# Patient Record
Sex: Female | Born: 1950 | Race: White | Hispanic: No | Marital: Married | Smoking: Former smoker
Health system: Southern US, Community
[De-identification: ages and names within clinical notes are randomized; demographics above are authoritative.]

## PROBLEM LIST (undated history)

## (undated) DIAGNOSIS — K219 Gastro-esophageal reflux disease without esophagitis: Secondary | ICD-10-CM

## (undated) DIAGNOSIS — M1990 Unspecified osteoarthritis, unspecified site: Secondary | ICD-10-CM

## (undated) DIAGNOSIS — I341 Nonrheumatic mitral (valve) prolapse: Secondary | ICD-10-CM

## (undated) DIAGNOSIS — E785 Hyperlipidemia, unspecified: Secondary | ICD-10-CM

## (undated) DIAGNOSIS — M4800 Spinal stenosis, site unspecified: Secondary | ICD-10-CM

## (undated) DIAGNOSIS — J439 Emphysema, unspecified: Secondary | ICD-10-CM

## (undated) DIAGNOSIS — I10 Essential (primary) hypertension: Secondary | ICD-10-CM

## (undated) DIAGNOSIS — M810 Age-related osteoporosis without current pathological fracture: Secondary | ICD-10-CM

## (undated) DIAGNOSIS — K635 Polyp of colon: Secondary | ICD-10-CM

## (undated) DIAGNOSIS — K5790 Diverticulosis of intestine, part unspecified, without perforation or abscess without bleeding: Secondary | ICD-10-CM

## (undated) DIAGNOSIS — K222 Esophageal obstruction: Secondary | ICD-10-CM

## (undated) DIAGNOSIS — H269 Unspecified cataract: Secondary | ICD-10-CM

## (undated) DIAGNOSIS — Z8709 Personal history of other diseases of the respiratory system: Secondary | ICD-10-CM

## (undated) HISTORY — DX: Esophageal obstruction: K22.2

## (undated) HISTORY — DX: Essential (primary) hypertension: I10

## (undated) HISTORY — DX: Hyperlipidemia, unspecified: E78.5

## (undated) HISTORY — DX: Emphysema, unspecified: J43.9

## (undated) HISTORY — DX: Unspecified cataract: H26.9

## (undated) HISTORY — DX: Gastro-esophageal reflux disease without esophagitis: K21.9

## (undated) HISTORY — DX: Spinal stenosis, site unspecified: M48.00

## (undated) HISTORY — PX: COLONOSCOPY: SHX174

## (undated) HISTORY — DX: Diverticulosis of intestine, part unspecified, without perforation or abscess without bleeding: K57.90

## (undated) HISTORY — DX: Unspecified osteoarthritis, unspecified site: M19.90

## (undated) HISTORY — PX: TONSILLECTOMY: SUR1361

## (undated) HISTORY — DX: Polyp of colon: K63.5

---

## 1999-06-02 ENCOUNTER — Encounter: Payer: Self-pay | Admitting: Obstetrics and Gynecology

## 1999-06-02 ENCOUNTER — Encounter: Admission: RE | Admit: 1999-06-02 | Discharge: 1999-06-02 | Payer: Self-pay | Admitting: Obstetrics and Gynecology

## 2000-01-30 ENCOUNTER — Ambulatory Visit (HOSPITAL_COMMUNITY): Admission: RE | Admit: 2000-01-30 | Discharge: 2000-01-30 | Payer: Self-pay | Admitting: General Surgery

## 2000-01-30 ENCOUNTER — Encounter: Payer: Self-pay | Admitting: General Surgery

## 2000-06-25 ENCOUNTER — Encounter (INDEPENDENT_AMBULATORY_CARE_PROVIDER_SITE_OTHER): Payer: Self-pay

## 2000-06-25 ENCOUNTER — Ambulatory Visit (HOSPITAL_COMMUNITY): Admission: RE | Admit: 2000-06-25 | Discharge: 2000-06-25 | Payer: Self-pay | Admitting: Obstetrics and Gynecology

## 2000-06-25 ENCOUNTER — Encounter: Payer: Self-pay | Admitting: Obstetrics and Gynecology

## 2001-05-06 ENCOUNTER — Encounter: Payer: Self-pay | Admitting: Obstetrics and Gynecology

## 2001-05-06 ENCOUNTER — Encounter: Admission: RE | Admit: 2001-05-06 | Discharge: 2001-05-06 | Payer: Self-pay | Admitting: Obstetrics and Gynecology

## 2003-05-09 LAB — HM COLONOSCOPY: HM Colonoscopy: NORMAL

## 2004-08-17 ENCOUNTER — Encounter: Admission: RE | Admit: 2004-08-17 | Discharge: 2004-08-17 | Payer: Self-pay | Admitting: Orthopedic Surgery

## 2004-09-02 ENCOUNTER — Encounter: Admission: RE | Admit: 2004-09-02 | Discharge: 2004-09-02 | Payer: Self-pay | Admitting: Orthopedic Surgery

## 2004-10-24 ENCOUNTER — Ambulatory Visit: Payer: Self-pay | Admitting: Internal Medicine

## 2004-11-23 ENCOUNTER — Ambulatory Visit: Payer: Self-pay | Admitting: Internal Medicine

## 2005-10-30 ENCOUNTER — Ambulatory Visit: Payer: Self-pay | Admitting: Internal Medicine

## 2005-12-15 ENCOUNTER — Encounter: Admission: RE | Admit: 2005-12-15 | Discharge: 2005-12-15 | Payer: Self-pay | Admitting: Orthopedic Surgery

## 2005-12-22 ENCOUNTER — Ambulatory Visit: Payer: Self-pay | Admitting: Internal Medicine

## 2006-01-05 ENCOUNTER — Encounter: Admission: RE | Admit: 2006-01-05 | Discharge: 2006-01-05 | Payer: Self-pay | Admitting: Orthopedic Surgery

## 2006-01-12 ENCOUNTER — Ambulatory Visit: Payer: Self-pay | Admitting: Internal Medicine

## 2006-04-06 ENCOUNTER — Ambulatory Visit: Payer: Self-pay | Admitting: Internal Medicine

## 2006-04-06 LAB — CONVERTED CEMR LAB
Cholesterol: 200 mg/dL (ref 0–200)
Triglyceride fasting, serum: 145 mg/dL (ref 0–149)

## 2006-04-13 ENCOUNTER — Ambulatory Visit: Payer: Self-pay | Admitting: Internal Medicine

## 2006-07-17 ENCOUNTER — Ambulatory Visit: Payer: Self-pay | Admitting: Internal Medicine

## 2006-08-07 ENCOUNTER — Encounter: Payer: Self-pay | Admitting: Internal Medicine

## 2006-08-07 ENCOUNTER — Ambulatory Visit: Payer: Self-pay

## 2006-08-07 HISTORY — PX: NM MYOVIEW LTD: HXRAD82

## 2006-08-08 ENCOUNTER — Ambulatory Visit: Payer: Self-pay | Admitting: Internal Medicine

## 2006-09-14 ENCOUNTER — Ambulatory Visit: Payer: Self-pay | Admitting: Internal Medicine

## 2006-09-14 LAB — CONVERTED CEMR LAB
Cholesterol: 216 mg/dL (ref 0–200)
Direct LDL: 116.5 mg/dL
GFR calc non Af Amer: 69 mL/min
HDL: 45.7 mg/dL (ref >39.0)
Potassium: 4.5 meq/L (ref 3.5–5.1)
Sodium: 143 meq/L (ref 135–145)
Total CHOL/HDL Ratio: 4.7
VLDL: 44 mg/dL — ABNORMAL HIGH (ref 0–40)

## 2006-09-21 ENCOUNTER — Ambulatory Visit: Payer: Self-pay | Admitting: Internal Medicine

## 2007-01-18 ENCOUNTER — Ambulatory Visit: Payer: Self-pay | Admitting: Internal Medicine

## 2007-01-18 DIAGNOSIS — E785 Hyperlipidemia, unspecified: Secondary | ICD-10-CM | POA: Insufficient documentation

## 2007-01-18 DIAGNOSIS — E7800 Pure hypercholesterolemia, unspecified: Secondary | ICD-10-CM | POA: Insufficient documentation

## 2007-01-28 ENCOUNTER — Ambulatory Visit: Payer: Self-pay | Admitting: Internal Medicine

## 2007-01-28 DIAGNOSIS — I10 Essential (primary) hypertension: Secondary | ICD-10-CM | POA: Insufficient documentation

## 2007-01-28 DIAGNOSIS — M4800 Spinal stenosis, site unspecified: Secondary | ICD-10-CM | POA: Insufficient documentation

## 2007-01-29 ENCOUNTER — Encounter: Payer: Self-pay | Admitting: Internal Medicine

## 2007-02-22 ENCOUNTER — Encounter: Payer: Self-pay | Admitting: Internal Medicine

## 2007-03-08 ENCOUNTER — Ambulatory Visit: Payer: Self-pay | Admitting: Internal Medicine

## 2007-03-10 LAB — CONVERTED CEMR LAB
Bilirubin, Direct: 0.1 mg/dL (ref 0.0–0.3)
Cholesterol: 165 mg/dL (ref 0–200)
HDL: 53 mg/dL (ref >39.0)
Homocysteine: 6.8 micromoles/L (ref 5.00–13.90)
LDL Cholesterol: 97 mg/dL (ref 0–99)
Total CHOL/HDL Ratio: 3.1
Total Protein: 6.5 g/dL (ref 6.0–8.3)
Triglycerides: 73 mg/dL (ref 0–149)

## 2007-03-15 ENCOUNTER — Ambulatory Visit: Payer: Self-pay | Admitting: Internal Medicine

## 2007-03-15 DIAGNOSIS — M1990 Unspecified osteoarthritis, unspecified site: Secondary | ICD-10-CM | POA: Insufficient documentation

## 2007-03-15 LAB — CONVERTED CEMR LAB: Cholesterol, target level: 200 mg/dL

## 2007-07-02 ENCOUNTER — Telehealth: Payer: Self-pay | Admitting: Internal Medicine

## 2007-09-13 ENCOUNTER — Ambulatory Visit: Payer: Self-pay | Admitting: Internal Medicine

## 2007-09-13 DIAGNOSIS — M6740 Ganglion, unspecified site: Secondary | ICD-10-CM | POA: Insufficient documentation

## 2007-09-13 DIAGNOSIS — M949 Disorder of cartilage, unspecified: Secondary | ICD-10-CM

## 2007-09-13 DIAGNOSIS — M899 Disorder of bone, unspecified: Secondary | ICD-10-CM | POA: Insufficient documentation

## 2007-09-13 LAB — CONVERTED CEMR LAB: Vit D, 1,25-Dihydroxy: 55 (ref 30–89)

## 2007-09-19 LAB — CONVERTED CEMR LAB
ALT: 23 units/L (ref 0–35)
AST: 25 units/L (ref 0–37)
Alkaline Phosphatase: 73 units/L (ref 39–117)
Bilirubin, Direct: 0.1 mg/dL (ref 0.0–0.3)
CO2: 31 meq/L (ref 19–32)
Calcium: 9.2 mg/dL (ref 8.4–10.5)
Chloride: 107 meq/L (ref 96–112)
Glucose, Bld: 98 mg/dL (ref 70–99)
Hemoglobin: 14.4 g/dL (ref 12.0–15.0)
LDL Cholesterol: 83 mg/dL (ref 0–99)
Lymphocytes Relative: 30.6 % (ref 12.0–46.0)
Monocytes Relative: 8.9 % (ref 3.0–12.0)
Neutro Abs: 3.1 10*3/uL (ref 1.4–7.7)
RBC: 4.82 M/uL (ref 3.87–5.11)
RDW: 13.2 % (ref 11.5–14.6)
Sodium: 144 meq/L (ref 135–145)
Total CHOL/HDL Ratio: 2.9
Total Protein: 6.8 g/dL (ref 6.0–8.3)
Triglycerides: 111 mg/dL (ref 0–149)

## 2008-03-26 ENCOUNTER — Ambulatory Visit: Payer: Self-pay | Admitting: Internal Medicine

## 2008-03-26 DIAGNOSIS — IMO0001 Reserved for inherently not codable concepts without codable children: Secondary | ICD-10-CM | POA: Insufficient documentation

## 2008-03-26 DIAGNOSIS — T50995A Adverse effect of other drugs, medicaments and biological substances, initial encounter: Secondary | ICD-10-CM | POA: Insufficient documentation

## 2008-03-30 ENCOUNTER — Encounter: Payer: Self-pay | Admitting: Internal Medicine

## 2008-03-30 LAB — CONVERTED CEMR LAB
ALT: 23 units/L (ref 0–35)
AST: 26 units/L (ref 0–37)
BUN: 21 mg/dL (ref 6–23)
CO2: 28 meq/L (ref 19–32)
Calcium: 9.5 mg/dL (ref 8.4–10.5)
Creatinine, Ser: 0.9 mg/dL (ref 0.4–1.2)
Glucose, Bld: 91 mg/dL (ref 70–99)
Total CK: 91 units/L (ref 7–177)
Total Protein: 6.8 g/dL (ref 6.0–8.3)

## 2008-04-06 ENCOUNTER — Encounter: Payer: Self-pay | Admitting: Internal Medicine

## 2008-05-25 ENCOUNTER — Ambulatory Visit: Payer: Self-pay | Admitting: Internal Medicine

## 2008-06-01 ENCOUNTER — Encounter: Payer: Self-pay | Admitting: Internal Medicine

## 2008-06-01 LAB — CONVERTED CEMR LAB
BUN: 22 mg/dL (ref 6–23)
CO2: 31 meq/L (ref 19–32)
Chloride: 105 meq/L (ref 96–112)
Cholesterol: 218 mg/dL (ref 0–200)
Creatinine, Ser: 0.8 mg/dL (ref 0.4–1.2)
Direct LDL: 135 mg/dL
Potassium: 4.3 meq/L (ref 3.5–5.1)

## 2008-10-19 ENCOUNTER — Ambulatory Visit: Payer: Self-pay | Admitting: Internal Medicine

## 2008-10-19 LAB — CONVERTED CEMR LAB
Cholesterol: 231 mg/dL — ABNORMAL HIGH (ref 0–200)
HDL: 56.5 mg/dL (ref >39.00)
TSH: 3.74 microintl units/mL (ref 0.35–5.50)
Total CHOL/HDL Ratio: 4
Triglycerides: 113 mg/dL (ref 0.0–149.0)

## 2008-10-29 ENCOUNTER — Ambulatory Visit: Payer: Self-pay | Admitting: Internal Medicine

## 2009-03-02 ENCOUNTER — Ambulatory Visit: Payer: Self-pay | Admitting: Internal Medicine

## 2009-03-02 DIAGNOSIS — R3915 Urgency of urination: Secondary | ICD-10-CM | POA: Insufficient documentation

## 2009-03-02 DIAGNOSIS — M549 Dorsalgia, unspecified: Secondary | ICD-10-CM | POA: Insufficient documentation

## 2009-03-02 LAB — CONVERTED CEMR LAB
Bilirubin Urine: NEGATIVE
Ketones, urine, test strip: NEGATIVE
Nitrite: NEGATIVE
Protein, U semiquant: NEGATIVE
Urobilinogen, UA: 0.2
pH: 6

## 2009-03-03 ENCOUNTER — Encounter: Payer: Self-pay | Admitting: Internal Medicine

## 2009-04-09 ENCOUNTER — Encounter (INDEPENDENT_AMBULATORY_CARE_PROVIDER_SITE_OTHER): Payer: Self-pay | Admitting: *Deleted

## 2009-04-23 ENCOUNTER — Ambulatory Visit: Payer: Self-pay | Admitting: Internal Medicine

## 2009-04-23 LAB — CONVERTED CEMR LAB
ALT: 19 units/L (ref 0–35)
AST: 24 units/L (ref 0–37)
Albumin: 4 g/dL (ref 3.5–5.2)
Alkaline Phosphatase: 64 units/L (ref 39–117)
Basophils Absolute: 0 10*3/uL (ref 0.0–0.1)
Blood in Urine, dipstick: NEGATIVE
Calcium: 9.1 mg/dL (ref 8.4–10.5)
Eosinophils Relative: 3 % (ref 0.0–5.0)
GFR calc non Af Amer: 68.02 mL/min (ref >60)
Glucose, Bld: 102 mg/dL — ABNORMAL HIGH (ref 70–99)
HCT: 42.7 % (ref 36.0–46.0)
HDL: 52.1 mg/dL (ref >39.00)
Hemoglobin: 14.1 g/dL (ref 12.0–15.0)
Ketones, urine, test strip: NEGATIVE
Lymphocytes Relative: 28.9 % (ref 12.0–46.0)
Lymphs Abs: 2.1 10*3/uL (ref 0.7–4.0)
Monocytes Relative: 8.4 % (ref 3.0–12.0)
Neutro Abs: 4.2 10*3/uL (ref 1.4–7.7)
Nitrite: NEGATIVE
Potassium: 4.4 meq/L (ref 3.5–5.1)
RBC: 4.64 M/uL (ref 3.87–5.11)
RDW: 12.9 % (ref 11.5–14.6)
Sodium: 140 meq/L (ref 135–145)
Specific Gravity, Urine: 1.02
TSH: 3.46 microintl units/mL (ref 0.35–5.50)
Total CHOL/HDL Ratio: 4
Triglycerides: 84 mg/dL (ref 0.0–149.0)
Urobilinogen, UA: 0.2
VLDL: 16.8 mg/dL (ref 0.0–40.0)
WBC Urine, dipstick: NEGATIVE
WBC: 7.1 10*3/uL (ref 4.5–10.5)

## 2009-04-28 ENCOUNTER — Ambulatory Visit: Payer: Self-pay | Admitting: Internal Medicine

## 2009-10-20 ENCOUNTER — Telehealth: Payer: Self-pay | Admitting: Family Medicine

## 2009-10-20 ENCOUNTER — Ambulatory Visit: Payer: Self-pay | Admitting: Family Medicine

## 2009-10-20 DIAGNOSIS — N3090 Cystitis, unspecified without hematuria: Secondary | ICD-10-CM | POA: Insufficient documentation

## 2009-10-20 LAB — CONVERTED CEMR LAB
Bilirubin Urine: NEGATIVE
Blood in Urine, dipstick: NEGATIVE
Glucose, Urine, Semiquant: NEGATIVE
Urobilinogen, UA: 0.2
WBC Urine, dipstick: NEGATIVE
pH: 7

## 2009-11-03 ENCOUNTER — Encounter (INDEPENDENT_AMBULATORY_CARE_PROVIDER_SITE_OTHER): Payer: Self-pay | Admitting: *Deleted

## 2010-02-23 ENCOUNTER — Telehealth: Payer: Self-pay | Admitting: *Deleted

## 2010-04-07 LAB — HM MAMMOGRAPHY: HM Mammogram: NORMAL

## 2010-04-15 ENCOUNTER — Encounter: Payer: Self-pay | Admitting: Internal Medicine

## 2010-04-26 ENCOUNTER — Ambulatory Visit: Payer: Self-pay | Admitting: Internal Medicine

## 2010-04-26 ENCOUNTER — Encounter: Payer: Self-pay | Admitting: Internal Medicine

## 2010-04-26 LAB — CONVERTED CEMR LAB
ALT: 24 units/L (ref 0–35)
AST: 26 units/L (ref 0–37)
Alkaline Phosphatase: 74 units/L (ref 39–117)
BUN: 17 mg/dL (ref 6–23)
Bilirubin, Direct: 0.1 mg/dL (ref 0.0–0.3)
Cholesterol: 246 mg/dL — ABNORMAL HIGH (ref 0–200)
Creatinine, Ser: 0.8 mg/dL (ref 0.4–1.2)
Direct LDL: 158.3 mg/dL
Eosinophils Relative: 2.7 % (ref 0.0–5.0)
GFR calc non Af Amer: 82.39 mL/min (ref >60.00)
HDL: 54.1 mg/dL (ref >39.00)
Lymphocytes Relative: 32.3 % (ref 12.0–46.0)
Monocytes Relative: 8.3 % (ref 3.0–12.0)
Neutrophils Relative %: 56.3 % (ref 43.0–77.0)
Platelets: 269 10*3/uL (ref 150.0–400.0)
RBC: 4.76 M/uL (ref 3.87–5.11)
Total Bilirubin: 0.7 mg/dL (ref 0.3–1.2)
Total CHOL/HDL Ratio: 5
VLDL: 28.2 mg/dL (ref 0.0–40.0)
WBC: 6.8 10*3/uL (ref 4.5–10.5)

## 2010-05-04 ENCOUNTER — Other Ambulatory Visit
Admission: RE | Admit: 2010-05-04 | Discharge: 2010-05-04 | Payer: Self-pay | Source: Home / Self Care | Admitting: Internal Medicine

## 2010-05-04 ENCOUNTER — Ambulatory Visit: Payer: Self-pay | Admitting: Internal Medicine

## 2010-05-04 LAB — CONVERTED CEMR LAB
Blood in Urine, dipstick: NEGATIVE
Ketones, urine, test strip: NEGATIVE
Nitrite: NEGATIVE
Protein, U semiquant: NEGATIVE
Specific Gravity, Urine: 1.015
Urobilinogen, UA: 0.2

## 2010-05-29 ENCOUNTER — Encounter: Payer: Self-pay | Admitting: Orthopedic Surgery

## 2010-06-07 NOTE — Letter (Signed)
Summary: Colonoscopy Letter  [facility]  [address] [place], [place] [address]   Phone: [phone]  Fax: [phone]      November 03, 2009 MRN: [id number]   Jacqueline Myers [address] [place], [place]  [address]   Dear Ms. FILION,   According to your medical record, it is time for you to schedule a Colonoscopy. The American Cancer Society recommends this procedure as a method to detect early colon cancer. Patients with a family history of colon cancer, or a personal history of colon polyps or inflammatory bowel disease are at increased risk.  This letter has been generated based on the recommendations made at the time of your procedure. If you feel that in your particular situation this may no longer apply, please contact our office.  Please call our office at [phone] to schedule this appointment or to update your records at your earliest convenience.  Thank you for cooperating with [place] to provide you with the very best care possible.   Sincerely,  Wilhemina Bonito. Marina Goodell, M.D.  [facility] [phone]

## 2010-06-07 NOTE — Progress Notes (Signed)
Summary: generic?  Phone Note From Pharmacy   Caller: [facility]  [address]  [phone]* Summary of Call: Re: Pyridium - can they use generic? Initial call taken by: Raechel Ache, RN,  October 20, 2009 4:21 PM  Follow-up for Phone Call        yes per Dr Clent Ridges Follow-up by: Raechel Ache, RN,  October 20, 2009 4:55 PM  Additional Follow-up for Phone Call Additional follow up Details #1::        Pharmacist called Additional Follow-up by: Raechel Ache, RN,  October 20, 2009 4:55 PM

## 2010-06-07 NOTE — Progress Notes (Signed)
Summary: Pt said that [facility] has not rcvd script for Linsinopril  Phone Note Call from Patient Call back at Home Phone [phone]   Caller: Patient Summary of Call: Pt called and had spoken with [facility] on [address] and was told that they have not rcvd the script for pts Lisinopril. Pls call it in today. Pt is almost out of med.  Initial call taken by: Lucy Antigua,  February 23, 2010 4:09 PM  Follow-up for Phone Call        John at the pharmacy said that they never recieved it. So I called it in. Pt aware and rx called in. Follow-up by: Romualdo Bolk, CMA Duncan Dull),  February 23, 2010 4:32 PM

## 2010-06-07 NOTE — Assessment & Plan Note (Signed)
Summary: UTI/ssc   Vital Signs:  Patient profile:   60 year old female Menstrual status:  postmenopausal Weight:      143 pounds BMI:     25.42 Temp:     98.3 degrees F oral BP sitting:   130 / 66  (left arm) Cuff size:   regular  Vitals Entered By: Raechel Ache, RN (October 20, 2009 11:26 AM) CC: C/o urgency and frequency since last Thursday- took AZO and got a little better but now back hurts.   History of Present Illness: Here for 2 days of urinary urgency and frequency, along with mild lower abdominal cramps. She does not burn on urinations. Some mild low back aches. No fever or nausea. She was seen for a similar episode last October, and she was treated with Cipro. her urine culture at that time was negative, and her symptoms went away. She is drinking water and cranberry juice, but she admits to drinking sodas almost all day long every day.   Allergies: 1)  ! Steroids 2)  Zocor  Past History:  Past Medical History: Reviewed history from 05/25/2008 and no changes required. Hypertension spinal stenosis g2p2 Osteoarthritis myoview 4/08  normal for abn ekg    Consults  Dr. Elayne Guerin Dr. Starr Sinclair   Past Surgical History: Reviewed history from 05/25/2008 and no changes required. Tonsillectomy    Review of Systems  The patient denies anorexia, fever, weight loss, weight gain, vision loss, decreased hearing, hoarseness, chest pain, syncope, dyspnea on exertion, peripheral edema, prolonged cough, headaches, hemoptysis, melena, hematochezia, severe indigestion/heartburn, hematuria, incontinence, genital sores, muscle weakness, suspicious skin lesions, transient blindness, difficulty walking, depression, unusual weight change, abnormal bleeding, enlarged lymph nodes, angioedema, breast masses, and testicular masses.    Physical Exam  General:  Well-developed,well-nourished,in no acute distress; alert,appropriate and cooperative throughout examination Lungs:  Normal  respiratory effort, chest expands symmetrically. Lungs are clear to auscultation, no crackles or wheezes. Heart:  Normal rate and regular rhythm. S1 and S2 normal without gallop, murmur, click, rub or other extra sounds. Abdomen:  soft, normal bowel sounds, no distention, no masses, no guarding, no rigidity, no rebound tenderness, no abdominal hernia, no inguinal hernia, no hepatomegaly, and no splenomegaly.  Mildly tender above the pubis. No CVAT   Impression & Recommendations:  Problem # 1:  UNSPECIFIED CYSTITIS (ICD-595.9)  Her updated medication list for this problem includes:    Ciprofloxacin Hcl 500 Mg Tabs (Ciprofloxacin hcl) .Marland Kitchen..Marland Kitchen Two times a day    Pyridium 200 Mg Tabs (Phenazopyridine hcl) .Marland Kitchen... Three times a day as needed bladder pain  Orders: UA Dipstick w/o Micro (manual) (04540) T-Culture, Urine ([id number])  Complete Medication List: 1)  Fish Oil Oil (Fish oil) .... Three tabs once daily` 2)  Calcium 500/d 500-125 Mg-unit Tabs (Calcium carbonate-vitamin d) .... Two once daily 3)  Glucosamine 1500 Complex Caps (Glucosamine-chondroit-vit c-mn) .... Once daily 4)  Co Q-10 200 Mg Caps (Coenzyme q10) 5)  G[employer] Women's Ultra Mega 50 Plus  6)  Prinzide 10-12.5 Mg Tabs (Lisinopril-hydrochlorothiazide) .Marland Kitchen.. 1 by mouth once daily 7)  Cyclobenzaprine Hcl 10 Mg Tabs (Cyclobenzaprine hcl) .... 1/2 to 1 by mouth three times a day as needed muscle spasm 8)  Ciprofloxacin Hcl 500 Mg Tabs (Ciprofloxacin hcl) .... Two times a day 9)  Pyridium 200 Mg Tabs (Phenazopyridine hcl) .... Three times a day as needed bladder pain  Patient Instructions: 1)  Treat with Cipro. Send for another culture. I advised her to eliminate or at least  greatly cut back on her use of sodas. If her culture returns negative again, we may consider the possibility of interstitial cystitis as an etiology.  Prescriptions: PYRIDIUM 200 MG TABS (PHENAZOPYRIDINE HCL) three times a day as needed bladder pain  #60 x 0    Entered and Authorized by:   Nelwyn Salisbury MD   Signed by:   Nelwyn Salisbury MD on 10/20/2009   Method used:   Electronically to        [facility]  [phone]* (retail)       [address]       [place], [place]  [address]       Ph: [phone] or [phone]       Fax: [phone]   RxID:   [id number] CIPROFLOXACIN HCL 500 MG TABS (CIPROFLOXACIN HCL) two times a day  #14 x 0   Entered and Authorized by:   Nelwyn Salisbury MD   Signed by:   Nelwyn Salisbury MD on 10/20/2009   Method used:   Electronically to        [facility]  [phone]* (retail)       [address]       [place], [place]  [address]       Ph: [phone] or [phone]       Fax: [phone]   RxID:   [id number]   Laboratory Results   Urine Tests    Routine Urinalysis   Color: yellow Appearance: Clear Glucose: negative   (Normal Range: Negative) Bilirubin: negative   (Normal Range: Negative) Ketone: negative   (Normal Range: Negative) Spec. Gravity: <1.005   (Normal Range: 1.003-1.035) Blood: negative   (Normal Range: Negative) pH: 7.0   (Normal Range: 5.0-8.0) Protein: trace   (Normal Range: Negative) Urobilinogen: 0.2   (Normal Range: 0-1) Nitrite: negative   (Normal Range: Negative) Leukocyte Esterace: negative   (Normal Range: Negative)

## 2010-06-09 NOTE — Assessment & Plan Note (Signed)
Summary: CPX/PAP/CJR   Vital Signs:  Patient profile:   60 year old female Menstrual status:  postmenopausal Height:      63.5 inches Weight:      142 pounds BMI:     24.85 Pulse rate:   60 / minute BP sitting:   130 / 84  (left arm) Cuff size:   regular  Vitals Entered By: Romualdo Bolk, CMA (AAMA) (May 04, 2010 11:05 AM) CC: CPX with pap-Discuss doing a pap   History of Present Illness: Jacqueline Myers  comes in today  for preventive visit . Since last visit  here  there have been no major changes in health status   however having issue with left knee and saw Dr Charlett Blake who dx bakers cyst and carilage problem.  rx with cortisone shot and then "gel shots" with help.  Has restarted some exercise since   with dietary  interventions  for the last 3 months . Has lpst a few pounds with this. Still taking  supplements ? if helping  joints.  HT: no   se of meds per patient.  Preventive Care Screening  Mammogram:    Date:  04/07/2010    Results:  normal   Last Tetanus Booster:    Date:  05/08/2004    Results:  Historical   Prior Values:    Pap Smear:  normal (06/08/2008)    Mammogram:  normal (04/09/2009)    Colonoscopy:  normal (05/09/2003)   Preventive Screening-Counseling & Management  Alcohol-Tobacco     Alcohol drinks/day: 0     Smoking Status: quit     Year Quit: 12 years ago     Passive Smoke Exposure: no  Caffeine-Diet-Exercise     Caffeine use/day: 1     Does Patient Exercise: yes     Type of exercise: walking     Times/week: 3  Hep-HIV-STD-Contraception     Dental Visit-last 6 months yes     Sun Exposure-Excessive: no  Safety-Violence-Falls     Seat Belt Use: 100     Smoke Detectors: no  Current Medications (verified): 1)  Fish Oil   Oil (Fish Oil) .... Three Tabs Once Daily` 2)  Calcium 500/d 500-125 Mg-Unit  Tabs (Calcium Carbonate-Vitamin D) .... Two Once Daily 3)  Glucosamine 1500 Complex   Caps (Glucosamine-Chondroit-Vit C-Mn)  .... Once Daily 4)  Co Q-10 200 Mg Caps (Coenzyme Q10) 5)  Multivitamins   Tabs (Multiple Vitamin) 6)  Prinzide 10-12.5 Mg Tabs (Lisinopril-Hydrochlorothiazide) .Marland Kitchen.. 1 By Mouth Once Daily 7)  Cyclobenzaprine Hcl 10 Mg Tabs (Cyclobenzaprine Hcl) .... 1/2 To 1 By Mouth Three Times A Day As Needed Muscle Spasm  Allergies (verified): 1)  ! Steroids 2)  Zocor  Past History:  Past medical, surgical, family and social histories (including risk factors) reviewed, and no changes noted (except as noted below).  Past Medical History: Hypertension spinal stenosis g2p2 Osteoarthritis myoview 4/08  normal for abn ekg  Hyperlipidemia   Consults  Dr. Elayne Guerin Dr. Starr Sinclair   Past Surgical History: Reviewed history from 05/25/2008 and no changes required. Tonsillectomy    Past History:  Care Management: Orthopedics: [facility]ology: [facility]  Family History: Reviewed history from 04/28/2009 and no changes required. Family History of Stroke M 1st degree relative  father  tobacco and ethoh Family History High cholesterol   Father: Stroke, 65 HBP, diabetes-adult onset Mother: High Cholesterol  PVD.  Siblings: Middle sister-Breast Cancer  di010ed      Social History: Reviewed  history from 05/25/2008 and no changes required. 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         sleep about 5 hours   irreg for a while   Physical Exam  General:   Well-developed,well-nourished,in no acute distress; alert,appropriate and cooperative throughout examination Head:  Normocephalic and atraumatic without obvious abnormalities. No apparent alopecia or balding. Eyes:  PERRL, EOMs full, conjunctiva clear  Ears:  R ear normal, L ear normal, and no external deformities.   Nose:  no external deformity and no external erythema.   Mouth:  good dentition and pharynx pink and moist.   Neck:  No deformities, masses, or tenderness noted. Breasts:  No mass, nodules, thickening, tenderness, bulging, retraction, inflamation, nipple discharge or skin changes noted.   Lungs:  Normal respiratory effort, chest expands symmetrically. Lungs are clear to auscultation, no crackles or wheezes. Heart:  Normal rate and regular rhythm. S1 and S2 normal without gallop, murmur, click, rub or other extra sounds. Abdomen:  Bowel sounds positive,abdomen soft and non-tender without masses, organomegaly or hernias noted. Rectal:  No external abnormalities noted. Normal sphincter tone. No rectal masses or tenderness. few hemrrhoids  Genitalia:  Pelvic Exam:        External: normal female genitalia without lesions or masses        Vagina: normal without lesions or masses        Cervix: normal without lesions or masses        Adnexa: normal bimanual exam without masses or fullness        Uterus: normal by palpation        Pap smear: performed Msk:  normal ROM, no joint warmth, no redness over joints, no joint deformities, and no joint instability.   Pulses:  pulses intact without delay   Extremities:  no clubbing cyanosis or edema  Neurologic:  alert & oriented X3, strength normal in all extremities, gait normal, and DTRs symmetrical and normal.   Skin:  turgor normal, color normal, no ecchymoses, no petechiae, and no purpura.   Cervical Nodes:  No lymphadenopathy noted Axillary Nodes:  No palpable lymphadenopathy Inguinal Nodes:  No significant adenopathy Psych:  Normal eye  contact, appropriate affect. Cognition appears normal.    Impression & Recommendations:  Problem # 1:  Preventive Health Care (ICD-V70.0) Discussed nutrition,exercise,diet,healthy weight, vitamin D and calcium.  continue   Problem # 2:  ROUTINE GYNECOLOGICAL EXAM (ICD-V72.31)  pap done  no hx of significant abnormals or cancer and if  ok con go to every 2-3 year check  Orders: Pap Smear, Thin Prep ( Collection of) (Z6109)  Problem # 3:  HYPERLIPIDEMIA (ICD-272.4) not better but doing healthy things  consider try off the supplement if not helping.  had se of simva in the past.  consider rx based on risk. Labs Reviewed: SGOT: 26 (04/26/2010)   SGPT: 24 (04/26/2010)  Lipid Goals: Chol Goal: 200 (03/15/2007)   HDL Goal: 40 (03/15/2007)   LDL Goal: 130 (03/15/2007)   TG Goal: 150 (03/15/2007)  Prior 10 Yr Risk Heart Disease: Not  enough information (10/29/2008)   HDL:54.10 (04/26/2010), 52.10 (04/23/2009)  LDL:DEL (05/25/2008), 83 (16/02/9603)  Chol:246 (04/26/2010), 225 (04/23/2009)  Trig:141.0 (04/26/2010), 84.0 (04/23/2009)  Problem # 4:  HYPERTENSION (ICD-401.9) Assessment: Unchanged  controlled Her updated medication list for this problem includes:    Prinzide 10-12.5 Mg Tabs (Lisinopril-hydrochlorothiazide) .Marland Kitchen... 1 by mouth once daily  BP today: 130/84 Prior BP: 130/66 (10/20/2009)  Prior 10 Yr Risk Heart Disease: Not enough information (10/29/2008)  Labs Reviewed: K+: 4.4 (04/26/2010) Creat: : 0.8 (04/26/2010)   Chol: 246 (04/26/2010)   HDL: 54.10 (04/26/2010)   LDL: DEL (05/25/2008)   TG: 141.0 (04/26/2010)  Complete Medication List: 1)  Fish Oil Oil (Fish oil) .... Three tabs once daily` 2)  Calcium 500/d 500-125 Mg-unit Tabs (Calcium carbonate-vitamin d) .... Two once daily 3)  Glucosamine 1500 Complex Caps (Glucosamine-chondroit-vit c-mn) .... Once daily 4)  Co Q-10 200 Mg Caps (Coenzyme q10) 5)  Multivitamins Tabs (Multiple vitamin) 6)  Prinzide 10-12.5 Mg Tabs  (Lisinopril-hydrochlorothiazide) .Marland Kitchen.. 1 by mouth once daily 7)  Cyclobenzaprine Hcl 10 Mg Tabs (Cyclobenzaprine hcl) .... 1/2 to 1 by mouth three times a day as needed muscle spasm  Other Orders: Flu Vaccine 16yrs + (54098) Admin 1st Vaccine (11914)  Patient Instructions: 1)  intensify lifestyle intervention and record and track  for 2 weeks   to help with lipids control 2)  weight  training a good idea  3)  Continue meds  4)  check up with labs in a year or as needed.    Orders Added: 1)  Flu Vaccine 46yrs + [90658] 2)  Admin 1st Vaccine [90471] 3)  Est. Patient 40-64 years [99396] 4)  Pap Smear, Thin Prep ( Collection of) [Q0091]   Immunizations Administered:  Influenza Vaccine # 1:    Vaccine Type: Fluvax 3+    Site: right deltoid    Mfr: Sanofi Pasteur    Dose: 0.5 ml    Route: IM    Given by: Romualdo Bolk, CMA (AAMA)    Exp. Date: 11/05/2010    Lot #: [id number]  Flu Vaccine Consent Questions:    Do you have a history of severe allergic reactions to this vaccine? no    Any prior history of allergic reactions to egg and/or gelatin? no    Do you have a sensitivity to the preservative Thimersol? no    Do you have a past history of Guillan-Barre Syndrome? no    Do you currently have an acute febrile illness? no    Have you ever had a severe reaction to latex? no    Vaccine information given and explained to patient? yes    Are you currently pregnant? no   Immunizations Administered:  Influenza Vaccine # 1:    Vaccine Type: Fluvax 3+    Site: right deltoid    Mfr: Sanofi Pasteur    Dose: 0.5 ml    Route: IM    Given by: Romualdo Bolk, CMA (AAMA)    Exp. Date: 11/05/2010    Lot #: [id number]  Laboratory Results   Urine Tests  Date/Time Received: May 04, 2010 11:18 AM   Routine Urinalysis   Color: yellow Appearance: Clear Glucose: negative   (Normal Range: Negative) Bilirubin: negative   (Normal Range: Negative) Ketone: negative   (Normal  Range: Negative) Spec. Gravity: 1.015   (Normal Range: 1.003-1.035) Blood: negative   (Normal Range: Negative) pH: 7.0   (Normal Range: 5.0-8.0) Protein: negative   (Normal Range: Negative) Urobilinogen: 0.2   (  Normal Range: 0-1) Nitrite: negative   (Normal Range: Negative) Leukocyte Esterace: negative   (Normal Range: Negative)    Comments: Romualdo Bolk, CMA (AAMA)  May 04, 2010 11:18 AM

## 2010-08-31 ENCOUNTER — Other Ambulatory Visit: Payer: Self-pay | Admitting: Internal Medicine

## 2010-09-06 ENCOUNTER — Encounter: Payer: Self-pay | Admitting: Internal Medicine

## 2011-02-17 ENCOUNTER — Other Ambulatory Visit: Payer: Self-pay | Admitting: Internal Medicine

## 2011-03-20 ENCOUNTER — Encounter: Payer: Self-pay | Admitting: Internal Medicine

## 2011-03-20 ENCOUNTER — Ambulatory Visit (INDEPENDENT_AMBULATORY_CARE_PROVIDER_SITE_OTHER): Payer: BC Managed Care – PPO | Admitting: Internal Medicine

## 2011-03-20 VITALS — BP 120/70 | HR 78 | Temp 98.1°F | Wt 128.0 lb

## 2011-03-20 DIAGNOSIS — Z7189 Other specified counseling: Secondary | ICD-10-CM

## 2011-03-20 DIAGNOSIS — L309 Dermatitis, unspecified: Secondary | ICD-10-CM

## 2011-03-20 DIAGNOSIS — L259 Unspecified contact dermatitis, unspecified cause: Secondary | ICD-10-CM

## 2011-03-20 MED ORDER — PREDNISONE 10 MG PO TABS: 10.0000 mg | ORAL_TABLET | Freq: Every day | ORAL | Status: AC

## 2011-03-20 NOTE — Patient Instructions (Signed)
Take prednisone  Cool compresses aveeno. Call if fever or other sx. Can decrease prednisone faster if doing well.

## 2011-03-20 NOTE — Progress Notes (Signed)
  Subjective:    Patient ID: Jacqueline Myers, female    DOB: 1950/05/13, 60 y.o.   MRN: [id number]  HPI Patient comes in today for SDA  For acute problem evaluation. Walked in to be seen and worked into schedule.  Onset of rash after helping in yard work at church picking up large amount of brush leaves 2 days ago  Had short sleeve shirt on.  Had itching  Soon after  antecubital area and then all over chest trunk  Without fever sweats joint pain  Systemic sx otherwise  Using otc topical ? What to use otherwise.  ?if PI vines near by and husband has some similar sx.   HT  No  Hive hx or angioedema Has ? About vaccine flu and shingle vaccine Review of Systems No fever cough uti has  gu gi issues  Does get weird thoughts with prednisone. No pain  Past history family history social history reviewed in the electronic medical record. Artist  Exercises  No limitations.     Objective:   Physical Exam  Skin: Rash noted.          Spared  Underwear and neck and face  Palms  And lower legs    WDWn in nad   Obvious rashes   Antecubital area very red papular pustular  Localized abtecubital and then evenly distributed thunk front and back  And a few in groin but spared underwear   Legs otherwise clear  And was as face  No vesicles palms soles spares LN: no cervical axillary inguinal adenopathy MS no joint swellings   Neuro non focal looks well HEENT grossly normal     Assessment & Plan:  Acute pruritic rash   Interesting distributions  Some look like Cd some look like chigger bites  A a few with firm pustules.  No systemic sx  No hot tub no fever  Risk benefit of medication discussed. Prednisone se discussed  local care and compresses   Ok to get immuniz a week or so after finshing prednisone  Call with alarm features  Total visit > 50% spent counseling and coordinating care

## 2011-04-20 ENCOUNTER — Ambulatory Visit (INDEPENDENT_AMBULATORY_CARE_PROVIDER_SITE_OTHER): Payer: BC Managed Care – PPO | Admitting: Internal Medicine

## 2011-04-20 DIAGNOSIS — Z23 Encounter for immunization: Secondary | ICD-10-CM

## 2011-04-26 ENCOUNTER — Encounter: Payer: Self-pay | Admitting: Internal Medicine

## 2011-05-25 ENCOUNTER — Encounter: Payer: Self-pay | Admitting: Internal Medicine

## 2011-05-28 ENCOUNTER — Other Ambulatory Visit: Payer: Self-pay | Admitting: Internal Medicine

## 2011-06-09 ENCOUNTER — Other Ambulatory Visit (INDEPENDENT_AMBULATORY_CARE_PROVIDER_SITE_OTHER): Payer: BC Managed Care – PPO

## 2011-06-09 DIAGNOSIS — Z0000 Encounter for general adult medical examination without abnormal findings: Secondary | ICD-10-CM

## 2011-06-09 LAB — CBC WITH DIFFERENTIAL/PLATELET
Basophils Absolute: 0 10*3/uL (ref 0.0–0.1)
Eosinophils Absolute: 0.3 10*3/uL (ref 0.0–0.7)
Hemoglobin: 13.9 g/dL (ref 12.0–15.0)
Lymphocytes Relative: 32 % (ref 12.0–46.0)
MCHC: 34.2 g/dL (ref 30.0–36.0)
Monocytes Relative: 8.7 % (ref 3.0–12.0)
Neutro Abs: 3.2 10*3/uL (ref 1.4–7.7)
Neutrophils Relative %: 53.3 % (ref 43.0–77.0)
RBC: 4.56 Mil/uL (ref 3.87–5.11)
RDW: 13.9 % (ref 11.5–14.6)

## 2011-06-09 LAB — BASIC METABOLIC PANEL
CO2: 27 mEq/L (ref 19–32)
Calcium: 9.1 mg/dL (ref 8.4–10.5)
Creatinine, Ser: 0.8 mg/dL (ref 0.4–1.2)
GFR: 76.27 mL/min (ref >60.00)
Glucose, Bld: 91 mg/dL (ref 70–99)
Sodium: 140 mEq/L (ref 135–145)

## 2011-06-09 LAB — LIPID PANEL
HDL: 66.6 mg/dL (ref >39.00)
Total CHOL/HDL Ratio: 3
Triglycerides: 73 mg/dL (ref 0.0–149.0)
VLDL: 14.6 mg/dL (ref 0.0–40.0)

## 2011-06-09 LAB — POCT URINALYSIS DIPSTICK
Ketones, UA: NEGATIVE
Leukocytes, UA: NEGATIVE
Nitrite, UA: NEGATIVE
Protein, UA: NEGATIVE
Urobilinogen, UA: 0.2
pH, UA: 7

## 2011-06-09 LAB — HEPATIC FUNCTION PANEL
AST: 28 U/L (ref 0–37)
Alkaline Phosphatase: 66 U/L (ref 39–117)
Bilirubin, Direct: 0 mg/dL (ref 0.0–0.3)

## 2011-06-09 LAB — LDL CHOLESTEROL, DIRECT: Direct LDL: 123.6 mg/dL

## 2011-06-28 ENCOUNTER — Encounter: Payer: Self-pay | Admitting: Internal Medicine

## 2011-06-28 ENCOUNTER — Ambulatory Visit (INDEPENDENT_AMBULATORY_CARE_PROVIDER_SITE_OTHER): Payer: BC Managed Care – PPO | Admitting: Internal Medicine

## 2011-06-28 VITALS — BP 120/70 | HR 66 | Ht 63.0 in | Wt 132.0 lb

## 2011-06-28 DIAGNOSIS — Z23 Encounter for immunization: Secondary | ICD-10-CM

## 2011-06-28 DIAGNOSIS — M25562 Pain in left knee: Secondary | ICD-10-CM

## 2011-06-28 DIAGNOSIS — I10 Essential (primary) hypertension: Secondary | ICD-10-CM

## 2011-06-28 DIAGNOSIS — M25569 Pain in unspecified knee: Secondary | ICD-10-CM

## 2011-06-28 DIAGNOSIS — N398 Other specified disorders of urinary system: Secondary | ICD-10-CM

## 2011-06-28 DIAGNOSIS — N952 Postmenopausal atrophic vaginitis: Secondary | ICD-10-CM

## 2011-06-28 DIAGNOSIS — E785 Hyperlipidemia, unspecified: Secondary | ICD-10-CM

## 2011-06-28 DIAGNOSIS — M4800 Spinal stenosis, site unspecified: Secondary | ICD-10-CM

## 2011-06-28 DIAGNOSIS — Z0000 Encounter for general adult medical examination without abnormal findings: Secondary | ICD-10-CM

## 2011-06-28 DIAGNOSIS — IMO0002 Reserved for concepts with insufficient information to code with codable children: Secondary | ICD-10-CM

## 2011-06-28 DIAGNOSIS — Z2911 Encounter for prophylactic immunotherapy for respiratory syncytial virus (RSV): Secondary | ICD-10-CM

## 2011-06-28 DIAGNOSIS — N3090 Cystitis, unspecified without hematuria: Secondary | ICD-10-CM

## 2011-06-28 MED ORDER — ESTROGENS, CONJUGATED 0.625 MG/GM VA CREA: TOPICAL_CREAM | Freq: Every day | VAGINAL | Status: DC

## 2011-06-28 NOTE — Progress Notes (Signed)
Subjective:    Patient ID: Jacqueline Myers, female    DOB: 08-Mar-1951, 61 y.o.   MRN: [id number]  HPI  Patient comes in today for preventive visit and follow-up of medical issues. Update of her history since her last visit.  BP;   Had to take aleve for knee and bp elevated. But otherwise doing well   Some in 140 range   Walking 3 miles   Left knee gel shots  Had helped .   A lot.    Helped for 6 months   Want some advice without referral for her difficulty urinating and dyspareunia hx  No dc or oab pain otherwise no fever .      Review of Systems ROS:  GEN/ HEENT: No fever, significant weight changes sweats headaches vision problems hearing changes, CV/ PULM; No chest pain shortness of breath cough, syncope,edema  change in exercise tolerance. GI /GU: No adominal pain, vomiting, change in bowel habits. No blood in the stool.   SKIN/HEME: ,no acute skin rashes suspicious lesions or bleeding. No lymphadenopathy, nodules, masses.  NEURO/ PSYCH:  No neurologic signs such as weakness numbness. No depression anxiety. IMM/ Allergy: No unusual infections.  Allergy .   REST of 12 system review negative except as per HPI Past Medical History  Diagnosis Date  . Hypertension   . Hyperlipidemia   . Spinal stenosis   . Osteoarthritis     History   Social History  . Marital Status: Married    Spouse Name: N/A    Number of Children: N/A  . Years of Education: N/A   Occupational History  . Not on file.   Social History Main Topics  . Smoking status: Former Games developer  . Smokeless tobacco: Not on file  . Alcohol Use: No  . Drug Use: No  . Sexually Active: Not on file   Other Topics Concern  . 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 Nm myoview ltd 08/2006    normal for abn ekg  . Tonsillectomy     Family History  Problem Relation Age of  Onset  . Alcohol abuse Mother   . Stroke Mother   . Hypertension Mother   . Diabetes Mother   . Hyperlipidemia Father   . Breast cancer Sister     Allergies  Allergen Reactions  . Simvastatin     REACTION: muscle aches    Current Outpatient Prescriptions on File Prior to Visit  Medication Sig Dispense Refill  . calcium-vitamin D (OSCAL WITH D) 500-200 MG-UNIT per tablet Take 1 tablet by mouth daily.        . cyclobenzaprine (FLEXERIL) 10 MG tablet TAKE ONE-HALF TO ONE TABLET BY MOUTH THREE TIMES DAILY AS NEEDED FOR MUSCLE SPASM.  90 tablet  0  . fish oil-omega-3 fatty acids 1000 MG capsule Take 2 g by mouth daily.        Marland Kitchen glucosamine-chondroitin 500-400 MG tablet Take 1 tablet by mouth 3 (three) times daily.        . MULTIPLE VITAMIN PO Take by mouth.        .    42.5 g  3    BP 120/70  Pulse 66  Ht 5\' 3"  (1.6 m)  Wt 132 lb (59.875 kg)  BMI 23.38 kg/m2        Objective:  Physical Exam Physical Exam: Vital signs reviewed [id number] is a well-developed well-nourished alert cooperative  white female who appears her stated age in no acute distress.  HEENT: normocephalic atraumatic , Eyes: PERRL EOM's full, conjunctiva clear, Nares: paten,t no deformity discharge or tenderness., Ears: no deformity EAC's clear TMs with normal landmarks. Mouth: clear OP, no lesions, edema.  Moist mucous membranes. Dentition in adequate repair. NECK: supple without masses, thyromegaly or bruits. CHEST/PULM:  Clear to auscultation and percussion breath sounds equal no wheeze , rales or rhonchi. No chest wall deformities or tenderness. Breast: normal by inspection . No dimpling, discharge, masses, tenderness or discharge .  CV: PMI is nondisplaced, S1 S2 no gallops, murmurs, rubs. Peripheral pulses are full without delay.No JVD .  ABDOMEN: Bowel sounds normal nontender  No guard or rebound, no hepato splenomegal no CVA tenderness.  No hernia. Extremtities:  No clubbing cyanosis or edema, no acute  joint swelling or redness no focal atrophy NEURO:  Oriented x3, cranial nerves 3-12 appear to be intact, no obvious focal weakness,gait within normal limits no abnormal reflexes or asymmetrical SKIN: No acute rashes normal turgor, color, no bruising or petechiae. PSYCH: Oriented, good eye contact, no obvious depression anxiety, cognition and judgment appear normal. LN: no cervical axillary inguinal adenopathy GU:  Ext no lesion but irritated some bladder prolapse with strain but only mild no masses or discharge  Atrophic mucosa. Lab Results  Component Value Date   WBC 6.0 06/09/2011   HGB 13.9 06/09/2011   HCT 40.8 06/09/2011   PLT 256.0 06/09/2011   GLUCOSE 91 06/09/2011   CHOL 209* 06/09/2011   TRIG 73.0 06/09/2011   HDL 66.60 06/09/2011   LDLDIRECT 123.6 06/09/2011   LDLCALC 83 09/13/2007   ALT 24 06/09/2011   AST 28 06/09/2011   NA 140 06/09/2011   K 4.6 06/09/2011   CL 106 06/09/2011   CREATININE 0.8 06/09/2011   BUN 25* 06/09/2011   CO2 27 06/09/2011   TSH 4.03 06/09/2011         Assessment & Plan:  Preventive Health Care Continue lifestyle intervention healthy eating and exercise . Up to date  on healthcare parameters per hx  But zostavax to check   HT controlled  On meds no change up at times prob from pain or med  MS djd knee back  Under care  Disc  . Exercise and joint protection. dyspareunia and bladder voiding issues    Estrogen creams may help   Explanations with this  Cost may be a factor with a given prep .

## 2011-06-28 NOTE — Patient Instructions (Signed)
Try the vaginal  Estrogen cream  Continue bp  Med and Continue lifestyle intervention healthy eating and exercise . Call if bp readings  Sty high.  ROV in 6-12 months or as needed

## 2011-07-01 DIAGNOSIS — Z0000 Encounter for general adult medical examination without abnormal findings: Secondary | ICD-10-CM | POA: Insufficient documentation

## 2011-07-01 DIAGNOSIS — N398 Other specified disorders of urinary system: Secondary | ICD-10-CM | POA: Insufficient documentation

## 2011-07-01 DIAGNOSIS — M25562 Pain in left knee: Secondary | ICD-10-CM | POA: Insufficient documentation

## 2011-07-07 ENCOUNTER — Telehealth: Payer: Self-pay | Admitting: Gastroenterology

## 2011-07-07 NOTE — Telephone Encounter (Signed)
Talked to the patient about her recall Colonoscopy. Patient stated that she is not going to have the procedure again it cost her to much the last time.

## 2011-08-30 ENCOUNTER — Other Ambulatory Visit: Payer: Self-pay | Admitting: Internal Medicine

## 2012-02-28 ENCOUNTER — Other Ambulatory Visit: Payer: Self-pay | Admitting: Internal Medicine

## 2012-04-19 ENCOUNTER — Encounter: Payer: Self-pay | Admitting: Internal Medicine

## 2012-06-19 ENCOUNTER — Other Ambulatory Visit (INDEPENDENT_AMBULATORY_CARE_PROVIDER_SITE_OTHER): Payer: BC Managed Care – PPO

## 2012-06-19 DIAGNOSIS — Z0000 Encounter for general adult medical examination without abnormal findings: Secondary | ICD-10-CM

## 2012-06-19 LAB — LIPID PANEL
HDL: 59.3 mg/dL (ref >39.00)
VLDL: 24.6 mg/dL (ref 0.0–40.0)

## 2012-06-19 LAB — HEPATIC FUNCTION PANEL
AST: 27 U/L (ref 0–37)
Alkaline Phosphatase: 64 U/L (ref 39–117)
Total Bilirubin: 0.6 mg/dL (ref 0.3–1.2)

## 2012-06-19 LAB — CBC WITH DIFFERENTIAL/PLATELET
Basophils Absolute: 0 10*3/uL (ref 0.0–0.1)
Lymphocytes Relative: 32.6 % (ref 12.0–46.0)
Monocytes Relative: 8.3 % (ref 3.0–12.0)
Platelets: 266 10*3/uL (ref 150.0–400.0)
RDW: 14 % (ref 11.5–14.6)

## 2012-06-19 LAB — BASIC METABOLIC PANEL
BUN: 23 mg/dL (ref 6–23)
Calcium: 9.6 mg/dL (ref 8.4–10.5)
GFR: 83.07 mL/min (ref >60.00)
Glucose, Bld: 92 mg/dL (ref 70–99)

## 2012-06-19 LAB — IBC PANEL: Transferrin: 266.6 mg/dL (ref 212.0–360.0)

## 2012-06-19 LAB — TSH: TSH: 3.52 u[IU]/mL (ref 0.35–5.50)

## 2012-06-19 NOTE — Addendum Note (Signed)
Addended by: Rossie Muskrat K on: 06/19/2012 10:14 AM   Modules accepted: Orders

## 2012-06-20 ENCOUNTER — Other Ambulatory Visit: Payer: BC Managed Care – PPO

## 2012-06-27 ENCOUNTER — Ambulatory Visit: Payer: BC Managed Care – PPO | Admitting: Internal Medicine

## 2012-06-28 ENCOUNTER — Other Ambulatory Visit (HOSPITAL_COMMUNITY)
Admission: RE | Admit: 2012-06-28 | Discharge: 2012-06-28 | Disposition: A | Discharge status: Home / Self Care | Payer: BC Managed Care – PPO | Source: Ambulatory Visit | Attending: Internal Medicine | Admitting: Internal Medicine

## 2012-06-28 ENCOUNTER — Ambulatory Visit (INDEPENDENT_AMBULATORY_CARE_PROVIDER_SITE_OTHER): Payer: BC Managed Care – PPO | Admitting: Internal Medicine

## 2012-06-28 ENCOUNTER — Encounter: Payer: Self-pay | Admitting: Internal Medicine

## 2012-06-28 VITALS — BP 126/70 | HR 89 | Temp 98.7°F | Ht 63.5 in | Wt 138.0 lb

## 2012-06-28 DIAGNOSIS — Z01419 Encounter for gynecological examination (general) (routine) without abnormal findings: Secondary | ICD-10-CM | POA: Insufficient documentation

## 2012-06-28 DIAGNOSIS — Z1151 Encounter for screening for human papillomavirus (HPV): Secondary | ICD-10-CM | POA: Insufficient documentation

## 2012-06-28 DIAGNOSIS — M1990 Unspecified osteoarthritis, unspecified site: Secondary | ICD-10-CM

## 2012-06-28 DIAGNOSIS — Z0000 Encounter for general adult medical examination without abnormal findings: Secondary | ICD-10-CM

## 2012-06-28 DIAGNOSIS — I10 Essential (primary) hypertension: Secondary | ICD-10-CM

## 2012-06-28 DIAGNOSIS — Z7189 Other specified counseling: Secondary | ICD-10-CM

## 2012-06-28 DIAGNOSIS — E785 Hyperlipidemia, unspecified: Secondary | ICD-10-CM

## 2012-06-28 DIAGNOSIS — M6530 Trigger finger, unspecified finger: Secondary | ICD-10-CM

## 2012-06-28 MED ORDER — CYCLOBENZAPRINE HCL 10 MG PO TABS: ORAL_TABLET | ORAL | Status: DC

## 2012-06-28 NOTE — Patient Instructions (Signed)
Get your  Colonoscopy   Continue lifestyle intervention healthy eating and exercise . Will notify you  of pap when available. conatct [place] for refills   Can check  Cardiovascular risk calculator  On a web site such as heart.org    Preventive Care for Adults, Female A healthy lifestyle and preventive care can promote health and wellness. Preventive health guidelines for women include the following key practices.  A routine yearly physical is a good way to check with your caregiver about your health and preventive screening. It is a chance to share any concerns and updates on your health, and to receive a thorough exam.  Visit your dentist for a routine exam and preventive care every 6 months. Brush your teeth twice a day and floss once a day. Good oral hygiene prevents tooth decay and gum disease.  The frequency of eye exams is based on your age, health, family medical history, use of contact lenses, and other factors. Follow your caregiver's recommendations for frequency of eye exams.  Eat a healthy diet. Foods like vegetables, fruits, whole grains, low-fat dairy products, and lean protein foods contain the nutrients you need without too many calories. Decrease your intake of foods high in solid fats, added sugars, and salt. Eat the right amount of calories for you.Get information about a proper diet from your caregiver, if necessary.  Regular physical exercise is one of the most important things you can do for your health. Most adults should get at least 150 minutes of moderate-intensity exercise (any activity that increases your heart rate and causes you to sweat) each week. In addition, most adults need muscle-strengthening exercises on 2 or more days a week.  Maintain a healthy weight. The body mass index (BMI) is a screening tool to identify possible weight problems. It provides an estimate of body fat based on height and weight. Your caregiver can help determine your BMI, and can help you  achieve or maintain a healthy weight.For adults 20 years and older:  A BMI below 18.5 is considered underweight.  A BMI of 18.5 to 24.9 is normal.  A BMI of 25 to 29.9 is considered overweight.  A BMI of 30 and above is considered obese.  Maintain normal blood lipids and cholesterol levels by exercising and minimizing your intake of saturated fat. Eat a balanced diet with plenty of fruit and vegetables. Blood tests for lipids and cholesterol should begin at age 55 and be repeated every 5 years. If your lipid or cholesterol levels are high, you are over 50, or you are at high risk for heart disease, you may need your cholesterol levels checked more frequently.Ongoing high lipid and cholesterol levels should be treated with medicines if diet and exercise are not effective.  If you smoke, find out from your caregiver how to quit. If you do not use tobacco, do not start.  If you are pregnant, do not drink alcohol. If you are breastfeeding, be very cautious about drinking alcohol. If you are not pregnant and choose to drink alcohol, do not exceed 1 drink per day. One drink is considered to be 12 ounces (355 mL) of beer, 5 ounces (148 mL) of wine, or 1.5 ounces (44 mL) of liquor.  Avoid use of street drugs. Do not share needles with anyone. Ask for help if you need support or instructions about stopping the use of drugs.  High blood pressure causes heart disease and increases the risk of stroke. Your blood pressure should be checked at  least every 1 to 2 years. Ongoing high blood pressure should be treated with medicines if weight loss and exercise are not effective.  If you are 90 to 63 years old, ask your caregiver if you should take aspirin to prevent strokes.  Diabetes screening involves taking a blood sample to check your fasting blood sugar level. This should be done once every 3 years, after age 64, if you are within normal weight and without risk factors for diabetes. Testing should be  considered at a younger age or be carried out more frequently if you are overweight and have at least 1 risk factor for diabetes.  Breast cancer screening is essential preventive care for women. You should practice "breast self-awareness." This means understanding the normal appearance and feel of your breasts and may include breast self-examination. Any changes detected, no matter how small, should be reported to a caregiver. Women in their 59s and 30s should have a clinical breast exam (CBE) by a caregiver as part of a regular health exam every 1 to 3 years. After age 81, women should have a CBE every year. Starting at age 52, women should consider having a mammography (breast X-ray test) every year. Women who have a family history of breast cancer should talk to their caregiver about genetic screening. Women at a high risk of breast cancer should talk to their caregivers about having magnetic resonance imaging (MRI) and a mammography every year.  The Pap test is a screening test for cervical cancer. A Pap test can show cell changes on the cervix that might become cervical cancer if left untreated. A Pap test is a procedure in which cells are obtained and examined from the lower end of the uterus (cervix).  Women should have a Pap test starting at age 19.  Between ages 46 and 90, Pap tests should be repeated every 2 years.  Beginning at age 24, you should have a Pap test every 3 years as long as the past 3 Pap tests have been normal.  Some women have medical problems that increase the chance of getting cervical cancer. Talk to your caregiver about these problems. It is especially important to talk to your caregiver if a new problem develops soon after your last Pap test. In these cases, your caregiver may recommend more frequent screening and Pap tests.  The above recommendations are the same for women who have or have not gotten the vaccine for human papillomavirus (HPV).  If you had a  hysterectomy for a problem that was not cancer or a condition that could lead to cancer, then you no longer need Pap tests. Even if you no longer need a Pap test, a regular exam is a good idea to make sure no other problems are starting.  If you are between ages 29 and 87, and you have had normal Pap tests going back 10 years, you no longer need Pap tests. Even if you no longer need a Pap test, a regular exam is a good idea to make sure no other problems are starting.  If you have had past treatment for cervical cancer or a condition that could lead to cancer, you need Pap tests and screening for cancer for at least 20 years after your treatment.  If Pap tests have been discontinued, risk factors (such as a new sexual partner) need to be reassessed to determine if screening should be resumed.  The HPV test is an additional test that may be used for cervical cancer  screening. The HPV test looks for the virus that can cause the cell changes on the cervix. The cells collected during the Pap test can be tested for HPV. The HPV test could be used to screen women aged 27 years and older, and should be used in women of any age who have unclear Pap test results. After the age of 7, women should have HPV testing at the same frequency as a Pap test.  Colorectal cancer can be detected and often prevented. Most routine colorectal cancer screening begins at the age of 45 and continues through age 63. However, your caregiver may recommend screening at an earlier age if you have risk factors for colon cancer. On a yearly basis, your caregiver may provide home test kits to check for hidden blood in the stool. Use of a small camera at the end of a tube, to directly examine the colon (sigmoidoscopy or colonoscopy), can detect the earliest forms of colorectal cancer. Talk to your caregiver about this at age 76, when routine screening begins. Direct examination of the colon should be repeated every 5 to 10 years through age  85, unless early forms of pre-cancerous polyps or small growths are found.  Hepatitis C blood testing is recommended for all people born from [birth year] through 1965 and any individual with known risks for hepatitis C.  Practice safe sex. Use condoms and avoid high-risk sexual practices to reduce the spread of sexually transmitted infections (STIs). STIs include gonorrhea, chlamydia, syphilis, trichomonas, herpes, HPV, and human immunodeficiency virus (HIV). Herpes, HIV, and HPV are viral illnesses that have no cure. They can result in disability, cancer, and death. Sexually active women aged 36 and younger should be checked for chlamydia. Older women with new or multiple partners should also be tested for chlamydia. Testing for other STIs is recommended if you are sexually active and at increased risk.  Osteoporosis is a disease in which the bones lose minerals and strength with aging. This can result in serious bone fractures. The risk of osteoporosis can be identified using a bone density scan. Women ages 67 and over and women at risk for fractures or osteoporosis should discuss screening with their caregivers. Ask your caregiver whether you should take a calcium supplement or vitamin D to reduce the rate of osteoporosis.  Menopause can be associated with physical symptoms and risks. Hormone replacement therapy is available to decrease symptoms and risks. You should talk to your caregiver about whether hormone replacement therapy is right for you.  Use sunscreen with sun protection factor (SPF) of 30 or more. Apply sunscreen liberally and repeatedly throughout the day. You should seek shade when your shadow is shorter than you. Protect yourself by wearing long sleeves, pants, a wide-brimmed hat, and sunglasses year round, whenever you are outdoors.  Once a month, do a whole body skin exam, using a mirror to look at the skin on your back. Notify your caregiver of new moles, moles that have irregular borders,  moles that are larger than a pencil eraser, or moles that have changed in shape or color.  Stay current with required immunizations.  Influenza. You need a dose every fall (or winter). The composition of the flu vaccine changes each year, so being vaccinated once is not enough.  Pneumococcal polysaccharide. You need 1 to 2 doses if you smoke cigarettes or if you have certain chronic medical conditions. You need 1 dose at age 22 (or older) if you have never been vaccinated.  Tetanus, diphtheria, pertussis (  Tdap, Td). Get 1 dose of Tdap vaccine if you are younger than age 78, are over 32 and have contact with an infant, are a Research scientist (physical sciences), are pregnant, or simply want to be protected from whooping cough. After that, you need a Td booster dose every 10 years. Consult your caregiver if you have not had at least 3 tetanus and diphtheria-containing shots sometime in your life or have a deep or dirty wound.  HPV. You need this vaccine if you are a woman age 31 or younger. The vaccine is given in 3 doses over 6 months.  Measles, mumps, rubella (MMR). You need at least 1 dose of MMR if you were born in 1957 or later. You may also need a second dose.  Meningococcal. If you are age 77 to 14 and a first-year college student living in a residence hall, or have one of several medical conditions, you need to get vaccinated against meningococcal disease. You may also need additional booster doses.  Zoster (shingles). If you are age 17 or older, you should get this vaccine.  Varicella (chickenpox). If you have never had chickenpox or you were vaccinated but received only 1 dose, talk to your caregiver to find out if you need this vaccine.  Hepatitis A. You need this vaccine if you have a specific risk factor for hepatitis A virus infection or you simply wish to be protected from this disease. The vaccine is usually given as 2 doses, 6 to 18 months apart.  Hepatitis B. You need this vaccine if you have a  specific risk factor for hepatitis B virus infection or you simply wish to be protected from this disease. The vaccine is given in 3 doses, usually over 6 months. Preventive Services / Frequency Ages 108 to 4  Blood pressure check.** / Every 1 to 2 years.  Lipid and cholesterol check.** / Every 5 years beginning at age 87.  Clinical breast exam.** / Every 3 years for women in their 14s and 30s.  Pap test.** / Every 2 years from ages 48 through 64. Every 3 years starting at age 39 through age 35 or 17 with a history of 3 consecutive normal Pap tests.  HPV screening.** / Every 3 years from ages 48 through ages 41 to 70 with a history of 3 consecutive normal Pap tests.  Hepatitis C blood test.** / For any individual with known risks for hepatitis C.  Skin self-exam. / Monthly.  Influenza immunization.** / Every year.  Pneumococcal polysaccharide immunization.** / 1 to 2 doses if you smoke cigarettes or if you have certain chronic medical conditions.  Tetanus, diphtheria, pertussis (Tdap, Td) immunization. / A one-time dose of Tdap vaccine. After that, you need a Td booster dose every 10 years.  HPV immunization. / 3 doses over 6 months, if you are 42 and younger.  Measles, mumps, rubella (MMR) immunization. / You need at least 1 dose of MMR if you were born in 1957 or later. You may also need a second dose.  Meningococcal immunization. / 1 dose if you are age 84 to 69 and a first-year college student living in a residence hall, or have one of several medical conditions, you need to get vaccinated against meningococcal disease. You may also need additional booster doses.  Varicella immunization.** / Consult your caregiver.  Hepatitis A immunization.** / Consult your caregiver. 2 doses, 6 to 18 months apart.  Hepatitis B immunization.** / Consult your caregiver. 3 doses usually over 6 months. Ages  40 to 64  Blood pressure check.** / Every 1 to 2 years.  Lipid and cholesterol  check.** / Every 5 years beginning at age 39.  Clinical breast exam.** / Every year after age 56.  Mammogram.** / Every year beginning at age 64 and continuing for as long as you are in good health. Consult with your caregiver.  Pap test.** / Every 3 years starting at age 58 through age 36 or 61 with a history of 3 consecutive normal Pap tests.  HPV screening.** / Every 3 years from ages 47 through ages 85 to 51 with a history of 3 consecutive normal Pap tests.  Fecal occult blood test (FOBT) of stool. / Every year beginning at age 16 and continuing until age 21. You may not need to do this test if you get a colonoscopy every 10 years.  Flexible sigmoidoscopy or colonoscopy.** / Every 5 years for a flexible sigmoidoscopy or every 10 years for a colonoscopy beginning at age 36 and continuing until age 63.  Hepatitis C blood test.** / For all people born from [birth year] through 1965 and any individual with known risks for hepatitis C.  Skin self-exam. / Monthly.  Influenza immunization.** / Every year.  Pneumococcal polysaccharide immunization.** / 1 to 2 doses if you smoke cigarettes or if you have certain chronic medical conditions.  Tetanus, diphtheria, pertussis (Tdap, Td) immunization.** / A one-time dose of Tdap vaccine. After that, you need a Td booster dose every 10 years.  Measles, mumps, rubella (MMR) immunization. / You need at least 1 dose of MMR if you were born in 1957 or later. You may also need a second dose.  Varicella immunization.** / Consult your caregiver.  Meningococcal immunization.** / Consult your caregiver.  Hepatitis A immunization.** / Consult your caregiver. 2 doses, 6 to 18 months apart.  Hepatitis B immunization.** / Consult your caregiver. 3 doses, usually over 6 months. Ages 15 and over  Blood pressure check.** / Every 1 to 2 years.  Lipid and cholesterol check.** / Every 5 years beginning at age 78.  Clinical breast exam.** / Every year after age  70.  Mammogram.** / Every year beginning at age 30 and continuing for as long as you are in good health. Consult with your caregiver.  Pap test.** / Every 3 years starting at age 34 through age 59 or 60 with a 3 consecutive normal Pap tests. Testing can be stopped between 65 and 70 with 3 consecutive normal Pap tests and no abnormal Pap or HPV tests in the past 10 years.  HPV screening.** / Every 3 years from ages 31 through ages 58 or 51 with a history of 3 consecutive normal Pap tests. Testing can be stopped between 65 and 70 with 3 consecutive normal Pap tests and no abnormal Pap or HPV tests in the past 10 years.  Fecal occult blood test (FOBT) of stool. / Every year beginning at age 31 and continuing until age 20. You may not need to do this test if you get a colonoscopy every 10 years.  Flexible sigmoidoscopy or colonoscopy.** / Every 5 years for a flexible sigmoidoscopy or every 10 years for a colonoscopy beginning at age 38 and continuing until age 71.  Hepatitis C blood test.** / For all people born from [birth year] through 1966 and any individual with known risks for hepatitis C.  Osteoporosis screening.** / A one-time screening for women ages 90 and over and women at risk for fractures or osteoporosis.  Skin  self-exam. / Monthly.  Influenza immunization.** / Every year.  Pneumococcal polysaccharide immunization.** / 1 dose at age 53 (or older) if you have never been vaccinated.  Tetanus, diphtheria, pertussis (Tdap, Td) immunization. / A one-time dose of Tdap vaccine if you are over 65 and have contact with an infant, are a Research scientist (physical sciences), or simply want to be protected from whooping cough. After that, you need a Td booster dose every 10 years.  Varicella immunization.** / Consult your caregiver.  Meningococcal immunization.** / Consult your caregiver.  Hepatitis A immunization.** / Consult your caregiver. 2 doses, 6 to 18 months apart.  Hepatitis B immunization.** / Check with  your caregiver. 3 doses, usually over 6 months. ** Family history and personal history of risk and conditions may change your caregiver's recommendations. Document Released: 06/20/2001 Document Revised: 07/17/2011 Document Reviewed: 09/19/2010 [facility] Patient Information 2013 [place], [place].

## 2012-06-28 NOTE — Addendum Note (Signed)
Addended by: Raj Janus T on: 06/28/2012 02:47 PM   Modules accepted: Orders

## 2012-06-28 NOTE — Progress Notes (Signed)
Chief Complaint  Patient presents with  . Annual Exam  . Hypertension    HPI: Patient comes in today for Preventive Health Care visit  No major change in health status since last visit . Trigger finger right hand  comes and goes  tmj jaw pain and clicking. Had orthodontic work in the past  Not using retainer BP controlled no se of meds Needs srefill of flxeril  Uses about 2-3 days a  Months for muscle spam. Fighting it off because of her insurance but now is on the bottom of care and will look into getting her colonoscopy this year. Up-to-date on mammogram. Has gained some weight over the winter plans on getting back to walking does better when she does exercises for her knee.   ROS:  GEN/ HEENT: No fever, significant weight changes sweats headaches vision problems hearing changes, CV/ PULM; No chest pain shortness of breath cough, syncope,edema  change in exercise tolerance. GI /GU: No adominal pain, vomiting, change in bowel habits. No blood in the stool. No significant GU symptoms. SKIN/HEME: ,no acute skin rashes suspicious lesions or bleeding. No lymphadenopathy, nodules, masses.  NEURO/ PSYCH:  No neurologic signs such as weakness numbness. No depression anxiety. IMM/ Allergy: No unusual infections.  Allergy .   REST of 12 system review negative except as per HPI   Past Medical History  Diagnosis Date  . Hypertension   . Hyperlipidemia   . Spinal stenosis   . Osteoarthritis     Family History  Problem Relation Age of Onset  . Alcohol abuse Mother   . Stroke Mother   . Hypertension Mother   . Diabetes Mother   . Hyperlipidemia Father   . Breast cancer Sister     History   Social History  . Marital Status: Married    Spouse Name: N/A    Number of Children: N/A  . Years of Education: N/A   Social History Main Topics  . Smoking status: Former Games developer  . Smokeless tobacco: None  . Alcohol Use: No  . Drug Use: No  . Sexually Active: None   Other Topics  Concern  . 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 aspirin 81 MG tablet Take 81 mg by mouth daily.      . calcium-vitamin D (OSCAL WITH D) 500-200 MG-UNIT per tablet Take 1 tablet by mouth daily.        . cyclobenzaprine (FLEXERIL) 10 MG tablet TAKE ONE-HALF TO ONE TABLET BY MOUTH THREE TIMES DAILY AS NEEDED FOR MUSCLE SPASM.  90 tablet  0  . fish oil-omega-3 fatty acids 1000 MG capsule Take 2 g by mouth daily.        Marland Kitchen glucosamine-chondroitin 500-400 MG tablet Take 1 tablet by mouth 3 (three) times daily.        Marland Kitchen lisinopril-hydrochlorothiazide (PRINZIDE,ZESTORETIC) 10-12.5 MG per tablet       . lisinopril-hydrochlorothiazide (PRINZIDE,ZESTORETIC) 10-12.5 MG per tablet Take 1 tablet by mouth daily.  90 tablet  1  . MULTIPLE VITAMIN PO Take by mouth.        . [DISCONTINUED] cyclobenzaprine (FLEXERIL) 10 MG tablet TAKE ONE-HALF TO ONE TABLET BY MOUTH THREE TIMES DAILY AS NEEDED FOR MUSCLE SPASM.  90 tablet  0  . [DISCONTINUED] conjugated  estrogens (PREMARIN) vaginal cream Place vaginally daily. 1 gram hs for 2 weeks then as directed 2-3 x per week  42.5 g  3   No facility-administered encounter medications on file as of 06/28/2012.    EXAM:  BP 126/70  Pulse 89  Temp(Src) 98.7 F (37.1 C) (Oral)  Ht 5' 3.5" (1.613 m)  Wt 138 lb (62.596 kg)  BMI 24.06 kg/m2  SpO2 98%  Body mass index is 24.06 kg/(m^2).  Physical Exam: Vital signs reviewed [id number] is a well-developed well-nourished alert cooperative   female who appears her stated age in no acute distress.  HEENT: normocephalic atraumatic , Eyes: PERRL EOM's full, conjunctiva clear, Nares: paten,t no deformity discharge or tenderness., Ears: no deformity EAC's clear TMs with normal landmarks. Mouth: clear OP, no lesions, edema.  Moist mucous membranes.  Dentition in adequate repair. NECK: supple without masses, thyromegaly or bruits. CHEST/PULM:  Clear to auscultation and percussion breath sounds equal no wheeze , rales or rhonchi. No chest wall deformities or tenderness. CV: PMI is nondisplaced, S1 S2 no gallops, murmurs, rubs. Peripheral pulses are full without delay.No JVD .  ABDOMEN: Bowel sounds normal nontender  No guard or rebound, no hepato splenomegal no CVA tenderness.  No hernia. Extremtities:  No clubbing cyanosis or edema, no acute joint swelling or redness no focal atrophy generative changes in her hands right more than left some mild triggering of her middle finger. Deformity of her thumb. NEURO:  Oriented x3, cranial nerves 3-12 appear to be intact, no obvious focal weakness,gait within normal limits no abnormal reflexes or asymmetrical SKIN: No acute rashes normal turgor, color, no bruising or petechiae. PSYCH: Oriented, good eye contact, no obvious depression anxiety, cognition and judgment appear normal. LN: no cervical axillary inguinal adenopathy Pelvic: NL ext GU, labia clear without lesions or rash . Vagina no lesions .Cervix: clear  UTERUS: Neg CMT Adnexa:  clear no masses . PAP done rectal stool smear neg heme no masses.    Lab Results  Component Value Date   WBC 6.9 06/19/2012   HGB 14.1 06/19/2012   HCT 41.8 06/19/2012   PLT 266.0 06/19/2012   GLUCOSE 92 06/19/2012   CHOL 212* 06/19/2012   TRIG 123.0 06/19/2012   HDL 59.30 06/19/2012   LDLDIRECT 125.4 06/19/2012   LDLCALC 83 09/13/2007   ALT 23 06/19/2012   AST 27 06/19/2012   NA 140 06/19/2012   K 4.4 06/19/2012   CL 104 06/19/2012   CREATININE 0.8 06/19/2012   BUN 23 06/19/2012   CO2 25 06/19/2012   TSH 3.52 06/19/2012    ASSESSMENT AND PLAN:  Discussed the following assessment and plan:  Visit for preventive health examination  HYPERTENSION  OSTEOARTHRITIS  HYPERLIPIDEMIA  Counseling on health promotion and disease prevention  Trigger finger, acquired -  middle right consdier hand consult if needed  Routine gynecological examination Disc statin and asa consensus statements   . At this time Continue lifestyle intervention healthy eating and exercise .  And follow  Patient Care Team: Madelin Headings, MD as PCP - General Lunette Stands, MD (Orthopedic Surgery) Hilarie Fredrickson, MD (Gastroenterology) Patient Instructions  Get your  Colonoscopy   Continue lifestyle intervention healthy eating and exercise . Will notify you  of pap when available. conatct [place] for refills   Can check  Cardiovascular risk calculator  On a web site such as heart.org    Preventive Care for Adults, Female A healthy lifestyle and preventive care can promote health and  wellness. Preventive health guidelines for women include the following key practices.  A routine yearly physical is a good way to check with your caregiver about your health and preventive screening. It is a chance to share any concerns and updates on your health, and to receive a thorough exam.  Visit your dentist for a routine exam and preventive care every 6 months. Brush your teeth twice a day and floss once a day. Good oral hygiene prevents tooth decay and gum disease.  The frequency of eye exams is based on your age, health, family medical history, use of contact lenses, and other factors. Follow your caregiver's recommendations for frequency of eye exams.  Eat a healthy diet. Foods like vegetables, fruits, whole grains, low-fat dairy products, and lean protein foods contain the nutrients you need without too many calories. Decrease your intake of foods high in solid fats, added sugars, and salt. Eat the right amount of calories for you.Get information about a proper diet from your caregiver, if necessary.  Regular physical exercise is one of the most important things you can do for your health. Most adults should get at least 150 minutes of moderate-intensity exercise (any activity that increases your  heart rate and causes you to sweat) each week. In addition, most adults need muscle-strengthening exercises on 2 or more days a week.  Maintain a healthy weight. The body mass index (BMI) is a screening tool to identify possible weight problems. It provides an estimate of body fat based on height and weight. Your caregiver can help determine your BMI, and can help you achieve or maintain a healthy weight.For adults 20 years and older:  A BMI below 18.5 is considered underweight.  A BMI of 18.5 to 24.9 is normal.  A BMI of 25 to 29.9 is considered overweight.  A BMI of 30 and above is considered obese.  Maintain normal blood lipids and cholesterol levels by exercising and minimizing your intake of saturated fat. Eat a balanced diet with plenty of fruit and vegetables. Blood tests for lipids and cholesterol should begin at age 7 and be repeated every 5 years. If your lipid or cholesterol levels are high, you are over 50, or you are at high risk for heart disease, you may need your cholesterol levels checked more frequently.Ongoing high lipid and cholesterol levels should be treated with medicines if diet and exercise are not effective.  If you smoke, find out from your caregiver how to quit. If you do not use tobacco, do not start.  If you are pregnant, do not drink alcohol. If you are breastfeeding, be very cautious about drinking alcohol. If you are not pregnant and choose to drink alcohol, do not exceed 1 drink per day. One drink is considered to be 12 ounces (355 mL) of beer, 5 ounces (148 mL) of wine, or 1.5 ounces (44 mL) of liquor.  Avoid use of street drugs. Do not share needles with anyone. Ask for help if you need support or instructions about stopping the use of drugs.  High blood pressure causes heart disease and increases the risk of stroke. Your blood pressure should be checked at least every 1 to 2 years. Ongoing high blood pressure should be treated with medicines if weight loss  and exercise are not effective.  If you are 70 to 63 years old, ask your caregiver if you should take aspirin to prevent strokes.  Diabetes screening involves taking a blood sample to check your fasting blood sugar level. This  should be done once every 3 years, after age 61, if you are within normal weight and without risk factors for diabetes. Testing should be considered at a younger age or be carried out more frequently if you are overweight and have at least 1 risk factor for diabetes.  Breast cancer screening is essential preventive care for women. You should practice "breast self-awareness." This means understanding the normal appearance and feel of your breasts and may include breast self-examination. Any changes detected, no matter how small, should be reported to a caregiver. Women in their 11s and 30s should have a clinical breast exam (CBE) by a caregiver as part of a regular health exam every 1 to 3 years. After age 62, women should have a CBE every year. Starting at age 64, women should consider having a mammography (breast X-ray test) every year. Women who have a family history of breast cancer should talk to their caregiver about genetic screening. Women at a high risk of breast cancer should talk to their caregivers about having magnetic resonance imaging (MRI) and a mammography every year.  The Pap test is a screening test for cervical cancer. A Pap test can show cell changes on the cervix that might become cervical cancer if left untreated. A Pap test is a procedure in which cells are obtained and examined from the lower end of the uterus (cervix).  Women should have a Pap test starting at age 35.  Between ages 66 and 58, Pap tests should be repeated every 2 years.  Beginning at age 22, you should have a Pap test every 3 years as long as the past 3 Pap tests have been normal.  Some women have medical problems that increase the chance of getting cervical cancer. Talk to your  caregiver about these problems. It is especially important to talk to your caregiver if a new problem develops soon after your last Pap test. In these cases, your caregiver may recommend more frequent screening and Pap tests.  The above recommendations are the same for women who have or have not gotten the vaccine for human papillomavirus (HPV).  If you had a hysterectomy for a problem that was not cancer or a condition that could lead to cancer, then you no longer need Pap tests. Even if you no longer need a Pap test, a regular exam is a good idea to make sure no other problems are starting.  If you are between ages 53 and 36, and you have had normal Pap tests going back 10 years, you no longer need Pap tests. Even if you no longer need a Pap test, a regular exam is a good idea to make sure no other problems are starting.  If you have had past treatment for cervical cancer or a condition that could lead to cancer, you need Pap tests and screening for cancer for at least 20 years after your treatment.  If Pap tests have been discontinued, risk factors (such as a new sexual partner) need to be reassessed to determine if screening should be resumed.  The HPV test is an additional test that may be used for cervical cancer screening. The HPV test looks for the virus that can cause the cell changes on the cervix. The cells collected during the Pap test can be tested for HPV. The HPV test could be used to screen women aged 92 years and older, and should be used in women of any age who have unclear Pap test results. After the  age of 44, women should have HPV testing at the same frequency as a Pap test.  Colorectal cancer can be detected and often prevented. Most routine colorectal cancer screening begins at the age of 35 and continues through age 52. However, your caregiver may recommend screening at an earlier age if you have risk factors for colon cancer. On a yearly basis, your caregiver may provide home  test kits to check for hidden blood in the stool. Use of a small camera at the end of a tube, to directly examine the colon (sigmoidoscopy or colonoscopy), can detect the earliest forms of colorectal cancer. Talk to your caregiver about this at age 30, when routine screening begins. Direct examination of the colon should be repeated every 5 to 10 years through age 46, unless early forms of pre-cancerous polyps or small growths are found.  Hepatitis C blood testing is recommended for all people born from [birth year] through 1965 and any individual with known risks for hepatitis C.  Practice safe sex. Use condoms and avoid high-risk sexual practices to reduce the spread of sexually transmitted infections (STIs). STIs include gonorrhea, chlamydia, syphilis, trichomonas, herpes, HPV, and human immunodeficiency virus (HIV). Herpes, HIV, and HPV are viral illnesses that have no cure. They can result in disability, cancer, and death. Sexually active women aged 78 and younger should be checked for chlamydia. Older women with new or multiple partners should also be tested for chlamydia. Testing for other STIs is recommended if you are sexually active and at increased risk.  Osteoporosis is a disease in which the bones lose minerals and strength with aging. This can result in serious bone fractures. The risk of osteoporosis can be identified using a bone density scan. Women ages 59 and over and women at risk for fractures or osteoporosis should discuss screening with their caregivers. Ask your caregiver whether you should take a calcium supplement or vitamin D to reduce the rate of osteoporosis.  Menopause can be associated with physical symptoms and risks. Hormone replacement therapy is available to decrease symptoms and risks. You should talk to your caregiver about whether hormone replacement therapy is right for you.  Use sunscreen with sun protection factor (SPF) of 30 or more. Apply sunscreen liberally and  repeatedly throughout the day. You should seek shade when your shadow is shorter than you. Protect yourself by wearing long sleeves, pants, a wide-brimmed hat, and sunglasses year round, whenever you are outdoors.  Once a month, do a whole body skin exam, using a mirror to look at the skin on your back. Notify your caregiver of new moles, moles that have irregular borders, moles that are larger than a pencil eraser, or moles that have changed in shape or color.  Stay current with required immunizations.  Influenza. You need a dose every fall (or winter). The composition of the flu vaccine changes each year, so being vaccinated once is not enough.  Pneumococcal polysaccharide. You need 1 to 2 doses if you smoke cigarettes or if you have certain chronic medical conditions. You need 1 dose at age 52 (or older) if you have never been vaccinated.  Tetanus, diphtheria, pertussis (Tdap, Td). Get 1 dose of Tdap vaccine if you are younger than age 81, are over 60 and have contact with an infant, are a Research scientist (physical sciences), are pregnant, or simply want to be protected from whooping cough. After that, you need a Td booster dose every 10 years. Consult your caregiver if you have not had at least  3 tetanus and diphtheria-containing shots sometime in your life or have a deep or dirty wound.  HPV. You need this vaccine if you are a woman age 55 or younger. The vaccine is given in 3 doses over 6 months.  Measles, mumps, rubella (MMR). You need at least 1 dose of MMR if you were born in 1957 or later. You may also need a second dose.  Meningococcal. If you are age 27 to 84 and a first-year college student living in a residence hall, or have one of several medical conditions, you need to get vaccinated against meningococcal disease. You may also need additional booster doses.  Zoster (shingles). If you are age 89 or older, you should get this vaccine.  Varicella (chickenpox). If you have never had chickenpox or you  were vaccinated but received only 1 dose, talk to your caregiver to find out if you need this vaccine.  Hepatitis A. You need this vaccine if you have a specific risk factor for hepatitis A virus infection or you simply wish to be protected from this disease. The vaccine is usually given as 2 doses, 6 to 18 months apart.  Hepatitis B. You need this vaccine if you have a specific risk factor for hepatitis B virus infection or you simply wish to be protected from this disease. The vaccine is given in 3 doses, usually over 6 months. Preventive Services / Frequency Ages 55 to 20  Blood pressure check.** / Every 1 to 2 years.  Lipid and cholesterol check.** / Every 5 years beginning at age 41.  Clinical breast exam.** / Every 3 years for women in their 65s and 30s.  Pap test.** / Every 2 years from ages 58 through 20. Every 3 years starting at age 14 through age 44 or 15 with a history of 3 consecutive normal Pap tests.  HPV screening.** / Every 3 years from ages 16 through ages 14 to 24 with a history of 3 consecutive normal Pap tests.  Hepatitis C blood test.** / For any individual with known risks for hepatitis C.  Skin self-exam. / Monthly.  Influenza immunization.** / Every year.  Pneumococcal polysaccharide immunization.** / 1 to 2 doses if you smoke cigarettes or if you have certain chronic medical conditions.  Tetanus, diphtheria, pertussis (Tdap, Td) immunization. / A one-time dose of Tdap vaccine. After that, you need a Td booster dose every 10 years.  HPV immunization. / 3 doses over 6 months, if you are 56 and younger.  Measles, mumps, rubella (MMR) immunization. / You need at least 1 dose of MMR if you were born in 1957 or later. You may also need a second dose.  Meningococcal immunization. / 1 dose if you are age 33 to 17 and a first-year college student living in a residence hall, or have one of several medical conditions, you need to get vaccinated against meningococcal  disease. You may also need additional booster doses.  Varicella immunization.** / Consult your caregiver.  Hepatitis A immunization.** / Consult your caregiver. 2 doses, 6 to 18 months apart.  Hepatitis B immunization.** / Consult your caregiver. 3 doses usually over 6 months. Ages 1 to 21  Blood pressure check.** / Every 1 to 2 years.  Lipid and cholesterol check.** / Every 5 years beginning at age 41.  Clinical breast exam.** / Every year after age 97.  Mammogram.** / Every year beginning at age 56 and continuing for as long as you are in good health. Consult with your  caregiver.  Pap test.** / Every 3 years starting at age 65 through age 44 or 85 with a history of 3 consecutive normal Pap tests.  HPV screening.** / Every 3 years from ages 82 through ages 53 to 58 with a history of 3 consecutive normal Pap tests.  Fecal occult blood test (FOBT) of stool. / Every year beginning at age 24 and continuing until age 58. You may not need to do this test if you get a colonoscopy every 10 years.  Flexible sigmoidoscopy or colonoscopy.** / Every 5 years for a flexible sigmoidoscopy or every 10 years for a colonoscopy beginning at age 52 and continuing until age 52.  Hepatitis C blood test.** / For all people born from [birth year] through 1966 and any individual with known risks for hepatitis C.  Skin self-exam. / Monthly.  Influenza immunization.** / Every year.  Pneumococcal polysaccharide immunization.** / 1 to 2 doses if you smoke cigarettes or if you have certain chronic medical conditions.  Tetanus, diphtheria, pertussis (Tdap, Td) immunization.** / A one-time dose of Tdap vaccine. After that, you need a Td booster dose every 10 years.  Measles, mumps, rubella (MMR) immunization. / You need at least 1 dose of MMR if you were born in 1957 or later. You may also need a second dose.  Varicella immunization.** / Consult your caregiver.  Meningococcal immunization.** / Consult your  caregiver.  Hepatitis A immunization.** / Consult your caregiver. 2 doses, 6 to 18 months apart.  Hepatitis B immunization.** / Consult your caregiver. 3 doses, usually over 6 months. Ages 38 and over  Blood pressure check.** / Every 1 to 2 years.  Lipid and cholesterol check.** / Every 5 years beginning at age 56.  Clinical breast exam.** / Every year after age 70.  Mammogram.** / Every year beginning at age 38 and continuing for as long as you are in good health. Consult with your caregiver.  Pap test.** / Every 3 years starting at age 38 through age 98 or 17 with a 3 consecutive normal Pap tests. Testing can be stopped between 65 and 70 with 3 consecutive normal Pap tests and no abnormal Pap or HPV tests in the past 10 years.  HPV screening.** / Every 3 years from ages 40 through ages 11 or 23 with a history of 3 consecutive normal Pap tests. Testing can be stopped between 65 and 70 with 3 consecutive normal Pap tests and no abnormal Pap or HPV tests in the past 10 years.  Fecal occult blood test (FOBT) of stool. / Every year beginning at age 24 and continuing until age 6. You may not need to do this test if you get a colonoscopy every 10 years.  Flexible sigmoidoscopy or colonoscopy.** / Every 5 years for a flexible sigmoidoscopy or every 10 years for a colonoscopy beginning at age 39 and continuing until age 35.  Hepatitis C blood test.** / For all people born from [birth year] through 1966 and any individual with known risks for hepatitis C.  Osteoporosis screening.** / A one-time screening for women ages 63 and over and women at risk for fractures or osteoporosis.  Skin self-exam. / Monthly.  Influenza immunization.** / Every year.  Pneumococcal polysaccharide immunization.** / 1 dose at age 62 (or older) if you have never been vaccinated.  Tetanus, diphtheria, pertussis (Tdap, Td) immunization. / A one-time dose of Tdap vaccine if you are over 65 and have contact with an infant, are  a Research scientist (physical sciences), or simply want  to be protected from whooping cough. After that, you need a Td booster dose every 10 years.  Varicella immunization.** / Consult your caregiver.  Meningococcal immunization.** / Consult your caregiver.  Hepatitis A immunization.** / Consult your caregiver. 2 doses, 6 to 18 months apart.  Hepatitis B immunization.** / Check with your caregiver. 3 doses, usually over 6 months. ** Family history and personal history of risk and conditions may change your caregiver's recommendations. Document Released: 06/20/2001 Document Revised: 07/17/2011 Document Reviewed: 09/19/2010 [facility] Patient Information 2013 [place], [place].      Neta Mends. Kooper Godshall M.D. Health Maintenance  Topic Date Due  . Influenza Vaccine  01/06/2013  . Pap Smear  05/04/2013  . Colonoscopy  05/08/2013  . Mammogram  04/18/2014  . Tetanus/tdap  05/08/2014  . Zostavax  Completed   Health Maintenance Review }

## 2012-07-07 NOTE — Progress Notes (Signed)
Quick Note:  Tell patient PAP is normal. ______ 

## 2012-07-08 ENCOUNTER — Encounter: Payer: Self-pay | Admitting: Family Medicine

## 2012-07-24 ENCOUNTER — Encounter: Payer: Self-pay | Admitting: Internal Medicine

## 2012-08-31 ENCOUNTER — Other Ambulatory Visit: Payer: Self-pay | Admitting: Internal Medicine

## 2012-11-18 ENCOUNTER — Encounter: Payer: Self-pay | Admitting: Internal Medicine

## 2012-12-26 ENCOUNTER — Encounter: Payer: Self-pay | Admitting: Internal Medicine

## 2012-12-26 ENCOUNTER — Ambulatory Visit (AMBULATORY_SURGERY_CENTER): Payer: BC Managed Care – PPO

## 2012-12-26 VITALS — Ht 63.5 in | Wt 141.6 lb

## 2012-12-26 DIAGNOSIS — Z860100 Personal history of colon polyps, unspecified: Secondary | ICD-10-CM

## 2012-12-26 MED ORDER — MOVIPREP 100 G PO SOLR: 1.0000 | Freq: Once | ORAL | Status: DC

## 2013-01-16 ENCOUNTER — Ambulatory Visit (AMBULATORY_SURGERY_CENTER): Payer: BC Managed Care – PPO | Admitting: Internal Medicine

## 2013-01-16 ENCOUNTER — Encounter: Payer: Self-pay | Admitting: Internal Medicine

## 2013-01-16 VITALS — BP 113/60 | HR 66 | Temp 97.9°F | Resp 16 | Ht 63.5 in | Wt 141.0 lb

## 2013-01-16 DIAGNOSIS — D126 Benign neoplasm of colon, unspecified: Secondary | ICD-10-CM

## 2013-01-16 DIAGNOSIS — Z86010 Personal history of colonic polyps: Secondary | ICD-10-CM

## 2013-01-16 MED ORDER — SODIUM CHLORIDE 0.9 % IV SOLN: 500.0000 mL | INTRAVENOUS | Status: DC

## 2013-01-16 NOTE — Progress Notes (Signed)
Called to room to assist during endoscopic procedure.  Patient ID and intended procedure confirmed with present staff. Received instructions for my participation in the procedure from the performing physician.  

## 2013-01-16 NOTE — Patient Instructions (Addendum)
Moderate diverticulosis.  2 polyps removed and sent to pathology.  Await results for final recommendation.      YOU HAD AN ENDOSCOPIC PROCEDURE TODAY AT THE [facility]: Refer to the procedure report that was given to you for any specific questions about what was found during the examination.  If the procedure report does not answer your questions, please call your gastroenterologist to clarify.  If you requested that your care partner not be given the details of your procedure findings, then the procedure report has been included in a sealed envelope for you to review at your convenience later.  YOU SHOULD EXPECT: Some feelings of bloating in the abdomen. Passage of more gas than usual.  Walking can help get rid of the air that was put into your GI tract during the procedure and reduce the bloating. If you had a lower endoscopy (such as a colonoscopy or flexible sigmoidoscopy) you may notice spotting of blood in your stool or on the toilet paper. If you underwent a bowel prep for your procedure, then you may not have a normal bowel movement for a few days.  DIET: Your first meal following the procedure should be a light meal and then it is ok to progress to your normal diet.  A half-sandwich or bowl of soup is an example of a good first meal.  Heavy or fried foods are harder to digest and may make you feel nauseous or bloated.  Likewise meals heavy in dairy and vegetables can cause extra gas to form and this can also increase the bloating.  Drink plenty of fluids but you should avoid alcoholic beverages for 24 hours.  ACTIVITY: Your care partner should take you home directly after the procedure.  You should plan to take it easy, moving slowly for the rest of the day.  You can resume normal activity the day after the procedure however you should NOT DRIVE or use heavy machinery for 24 hours (because of the sedation medicines used during the test).    SYMPTOMS TO REPORT IMMEDIATELY: A  gastroenterologist can be reached at any hour.  During normal business hours, 8:30 AM to 5:00 PM Monday through Friday, call [phone].  After hours and on weekends, please call the GI answering service at [phone] who will take a message and have the physician on call contact you.   Following lower endoscopy (colonoscopy or flexible sigmoidoscopy):  Excessive amounts of blood in the stool  Significant tenderness or worsening of abdominal pains  Swelling of the abdomen that is new, acute  Fever of 100F or higher  FOLLOW UP: If any biopsies were taken you will be contacted by phone or by letter within the next 1-3 weeks.  Call your gastroenterologist if you have not heard about the biopsies in 3 weeks.  Our staff will call the home number listed on your records the next business day following your procedure to check on you and address any questions or concerns that you may have at that time regarding the information given to you following your procedure. This is a courtesy call and so if there is no answer at the home number and we have not heard from you through the emergency physician on call, we will assume that you have returned to your regular daily activities without incident.  SIGNATURES/CONFIDENTIALITY: You and/or your care partner have signed paperwork which will be entered into your electronic medical record.  These signatures attest to the fact that that the  information above on your After Visit Summary has been reviewed and is understood.  Full responsibility of the confidentiality of this discharge information lies with you and/or your care-partner.

## 2013-01-16 NOTE — Op Note (Signed)
[facility] [address] [place], [address]   COLONOSCOPY PROCEDURE REPORT  PATIENT: Jacqueline Myers, Jacqueline Myers  MR#: [id number] BIRTHDATE: July 13, 1950 , 62  yrs. old GENDER: Female ENDOSCOPIST: Roxy Cedar, MD REFERRED [id number] Program Recall PROCEDURE DATE:  01/16/2013 PROCEDURE:   Colonoscopy with snare polypectomy  x 2 First Screening Colonoscopy - Avg.  risk and is 50 yrs.  old or older - No.  Prior Negative Screening - Now for repeat screening. N/A  History of Adenoma - Now for follow-up colonoscopy & has been > or = to 3 yrs.  Yes hx of adenoma.  Has been 3 or more years since last colonoscopy.  Polyps Removed Today? Yes. ASA CLASS:   Class II INDICATIONS:Patient's personal history of colon polyps.   Index 11-2004 (NAP) MEDICATIONS: MAC sedation, administered by CRNA and propofol (Diprivan) 300mg  IV  DESCRIPTION OF PROCEDURE:   After the risks benefits and alternatives of the procedure were thoroughly explained, informed consent was obtained.  A digital rectal exam revealed no abnormalities of the rectum.   The LB [id number]  endoscope was introduced through the anus and advanced to the cecum, which was identified by both the appendix and ileocecal valve. No adverse events experienced.   The quality of the prep was excellent, using MoviPrep  The instrument was then slowly withdrawn as the colon was fully examined.   COLON FINDINGS: Two diminutive polyps were found in the sigmoid colon.  A polypectomy was performed with a cold snare.  The resection was complete and the polyp tissue was completely retrieved.   Moderate diverticulosis was noted in the ascending colon and The finding was left colon.   The colon mucosa was otherwise normal.  Retroflexed views revealed internal hemorrhoids. The time to cecum=2 minutes 56 seconds.  Withdrawal time=14 minutes 10 seconds.  The scope was withdrawn and the procedure completed. COMPLICATIONS: There  were no complications.  ENDOSCOPIC IMPRESSION: 1.   Two diminutive polyps were found in the sigmoid colon; polypectomy was performed with a cold snare 2.   Moderate diverticulosis was noted in the ascending colon and left colon 3.   The colon mucosa was otherwise normal  RECOMMENDATIONS: 1. Repeat colonoscopy in 5 years if polyp adenomatous; otherwise 10 years   eSigned:  Roxy Cedar, MD 01/16/2013 9:46 AM   cc: Madelin Headings, MD and The Patient   PATIENT NAME:  Jacqueline Myers, Jacqueline Myers MR#: [id number]

## 2013-01-16 NOTE — Progress Notes (Signed)
Lidocaine-40mg IV prior to Propofol InductionPropofol given over incremental dosages 

## 2013-01-16 NOTE — Progress Notes (Signed)
Patient did not experience any of the following events: a burn prior to discharge; a fall within the facility; wrong site/side/patient/procedure/implant event; or a hospital transfer or hospital admission upon discharge from the facility. (G8907) Patient did not have preoperative order for IV antibiotic SSI prophylaxis. (G8918)  

## 2013-01-17 ENCOUNTER — Telehealth: Payer: Self-pay | Admitting: *Deleted

## 2013-01-17 NOTE — Telephone Encounter (Signed)
  Follow up Call-  Call back number 01/16/2013  Post procedure Call Back phone  # [phone]  Permission to leave phone message Yes     Patient questions:  Do you have a fever, pain , or abdominal swelling? no Pain Score  0 *  Have you tolerated food without any problems? yes  Have you been able to return to your normal activities? yes  Do you have any questions about your discharge instructions: Diet   no Medications  no Follow up visit  no  Do you have questions or concerns about your Care? no  Actions: * If pain score is 4 or above: No action needed, pain <4.

## 2013-01-22 ENCOUNTER — Encounter: Payer: Self-pay | Admitting: Internal Medicine

## 2013-03-13 ENCOUNTER — Other Ambulatory Visit: Payer: Self-pay

## 2013-04-24 ENCOUNTER — Encounter: Payer: Self-pay | Admitting: Internal Medicine

## 2013-05-12 ENCOUNTER — Encounter: Payer: Self-pay | Admitting: Internal Medicine

## 2013-05-26 ENCOUNTER — Other Ambulatory Visit: Payer: Self-pay | Admitting: Internal Medicine

## 2013-06-05 ENCOUNTER — Encounter: Payer: Self-pay | Admitting: Podiatrist

## 2013-06-11 ENCOUNTER — Encounter: Payer: Self-pay | Admitting: Internal Medicine

## 2013-06-23 ENCOUNTER — Other Ambulatory Visit (INDEPENDENT_AMBULATORY_CARE_PROVIDER_SITE_OTHER): Payer: BC Managed Care – PPO

## 2013-06-23 DIAGNOSIS — Z0000 Encounter for general adult medical examination without abnormal findings: Secondary | ICD-10-CM

## 2013-06-23 LAB — BASIC METABOLIC PANEL
BUN: 22 mg/dL (ref 6–23)
CO2: 28 meq/L (ref 19–32)
Calcium: 9.1 mg/dL (ref 8.4–10.5)
Chloride: 105 mEq/L (ref 96–112)
Creatinine, Ser: 0.8 mg/dL (ref 0.4–1.2)
GFR: 77.98 mL/min (ref >60.00)
GLUCOSE: 93 mg/dL (ref 70–99)
POTASSIUM: 4.5 meq/L (ref 3.5–5.1)
Sodium: 140 mEq/L (ref 135–145)

## 2013-06-23 LAB — CBC WITH DIFFERENTIAL/PLATELET
BASOS ABS: 0 10*3/uL (ref 0.0–0.1)
Basophils Relative: 0.5 % (ref 0.0–3.0)
EOS ABS: 0.4 10*3/uL (ref 0.0–0.7)
Eosinophils Relative: 5.4 % — ABNORMAL HIGH (ref 0.0–5.0)
HEMATOCRIT: 44.1 % (ref 36.0–46.0)
HEMOGLOBIN: 14.4 g/dL (ref 12.0–15.0)
LYMPHS ABS: 2.2 10*3/uL (ref 0.7–4.0)
Lymphocytes Relative: 31 % (ref 12.0–46.0)
MCHC: 32.6 g/dL (ref 30.0–36.0)
MCV: 90.5 fl (ref 78.0–100.0)
Monocytes Absolute: 0.5 10*3/uL (ref 0.1–1.0)
Monocytes Relative: 7.8 % (ref 3.0–12.0)
NEUTROS ABS: 3.9 10*3/uL (ref 1.4–7.7)
Neutrophils Relative %: 55.3 % (ref 43.0–77.0)
Platelets: 272 10*3/uL (ref 150.0–400.0)
RBC: 4.88 Mil/uL (ref 3.87–5.11)
RDW: 14.3 % (ref 11.5–14.6)
WBC: 7 10*3/uL (ref 4.5–10.5)

## 2013-06-23 LAB — LIPID PANEL
Cholesterol: 217 mg/dL — ABNORMAL HIGH (ref 0–200)
HDL: 55.8 mg/dL (ref >39.00)
Total CHOL/HDL Ratio: 4
Triglycerides: 214 mg/dL — ABNORMAL HIGH (ref 0.0–149.0)
VLDL: 42.8 mg/dL — ABNORMAL HIGH (ref 0.0–40.0)

## 2013-06-23 LAB — HEPATIC FUNCTION PANEL
ALT: 21 U/L (ref 0–35)
AST: 22 U/L (ref 0–37)
Albumin: 4.1 g/dL (ref 3.5–5.2)
Alkaline Phosphatase: 62 U/L (ref 39–117)
Bilirubin, Direct: 0 mg/dL (ref 0.0–0.3)
Total Bilirubin: 0.7 mg/dL (ref 0.3–1.2)
Total Protein: 6.9 g/dL (ref 6.0–8.3)

## 2013-06-23 LAB — TSH: TSH: 3.63 u[IU]/mL (ref 0.35–5.50)

## 2013-06-23 LAB — LDL CHOLESTEROL, DIRECT: Direct LDL: 127.6 mg/dL

## 2013-06-30 ENCOUNTER — Ambulatory Visit (INDEPENDENT_AMBULATORY_CARE_PROVIDER_SITE_OTHER): Payer: BC Managed Care – PPO | Admitting: Internal Medicine

## 2013-06-30 ENCOUNTER — Encounter: Payer: Self-pay | Admitting: Internal Medicine

## 2013-06-30 VITALS — BP 130/80 | HR 68 | Temp 98.5°F | Ht 63.0 in | Wt 145.0 lb

## 2013-06-30 DIAGNOSIS — L989 Disorder of the skin and subcutaneous tissue, unspecified: Secondary | ICD-10-CM

## 2013-06-30 DIAGNOSIS — E785 Hyperlipidemia, unspecified: Secondary | ICD-10-CM

## 2013-06-30 DIAGNOSIS — I10 Essential (primary) hypertension: Secondary | ICD-10-CM

## 2013-06-30 DIAGNOSIS — Z0000 Encounter for general adult medical examination without abnormal findings: Secondary | ICD-10-CM

## 2013-06-30 MED ORDER — LISINOPRIL-HYDROCHLOROTHIAZIDE 10-12.5 MG PO TABS: ORAL_TABLET | ORAL | Status: DC

## 2013-06-30 NOTE — Progress Notes (Signed)
Chief Complaint  Patient presents with  . Annual Exam  . Hypertension    HPI: Patient comes in today for Preventive Health Care visit  Knees and hips starting to hurt.  And les exercise and dietary discretion.  Left knee and hips. Up hill.  Has seen dr [name].  For knee injections not recently . Thinks lipids could be better if gets back to prev status  BP takes meds usually ok readings no cp sob but a bit more winded than in the past after stopping for a while Uses flexeril only occassionally  Rd area on right nasal bridge x 2 years cant see it in summer but in winter no change?    Health Maintenance  Topic Date Due  . Influenza Vaccine  12/06/2013  . Tetanus/tdap  05/08/2014  . Mammogram  04/23/2015  . Pap Smear  06/29/2015  . Colonoscopy  01/17/2023  . Zostavax  Completed   Health Maintenance Review  ROS:  GEN/ HEENT: No fever, significant weight changes sweats headaches vision problems hearing changes, CV/ PULM; No chest pain shortness of breath cough, syncope,edema  change in exercise tolerance. GI /GU: No adominal pain, vomiting, change in bowel habits. No blood in the stool. No significant GU symptoms. SKIN/HEME: ,no acute skin rashes suspicious lesions or bleeding. No lymphadenopathy, nodules, masses.  NEURO/ PSYCH:  No neurologic signs such as weakness numbness. No depression anxiety. IMM/ Allergy: No unusual infections.  Allergy .   REST of 12 system review negative except as per HPI   Past Medical History  Diagnosis Date  . Hypertension   . Hyperlipidemia   . Spinal stenosis   . Osteoarthritis     Family History  Problem Relation Age of Onset  . Alcohol abuse Mother   . Stroke Mother   . Hypertension Mother   . Diabetes Mother   . Hyperlipidemia Father   . Breast cancer Sister   . Colon cancer Neg Hx     History   Social History  . Marital Status: Married    Spouse Name: N/A    Number of Children: N/A  . Years of Education: N/A   Social  History Main Topics  . Smoking status: Former Research scientist (life sciences)  . Smokeless tobacco: Never Used  . Alcohol Use: No  . Drug Use: No  . Sexual Activity: None   Other Topics Concern  . 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 calcium citrate-vitamin D (CITRACAL+D) 315-200 MG-UNIT per tablet Take 1 tablet by mouth 2 (two) times daily.  . cyclobenzaprine (FLEXERIL) 10 MG tablet TAKE ONE-HALF TO ONE TABLET BY MOUTH THREE TIMES DAILY AS NEEDED FOR MUSCLE SPASM.  . fish oil-omega-3 fatty acids 1000 MG capsule Take 4 g by mouth daily.   Marland Kitchen glucosamine-chondroitin 500-400 MG tablet Take 1 tablet by mouth 3 (three) times daily.    Marland Kitchen lisinopril-hydrochlorothiazide (PRINZIDE,ZESTORETIC) 10-12.5 MG per tablet TAKE ONE TABLET BY MOUTH ONCE DAILY  . Lutein 20 MG CAPS Take by mouth.  . MULTIPLE VITAMIN PO Take by mouth.    . [DISCONTINUED] lisinopril-hydrochlorothiazide (PRINZIDE,ZESTORETIC) 10-12.5 MG per tablet TAKE ONE TABLET BY MOUTH ONCE DAILY  . [DISCONTINUED] lisinopril-hydrochlorothiazide (PRINZIDE,ZESTORETIC) 10-12.5 MG per tablet     EXAM:  BP 130/80  Pulse 68  Temp(Src) 98.5 F (36.9 C) (Oral)  Ht 5\' 3"  (1.6 m)  Wt 145 lb (65.772 kg)  BMI 25.69 kg/m2  SpO2 96%  Body mass index is 25.69 kg/(m^2). Wt Readings from Last 3 Encounters:  06/30/13 145 lb (65.772 kg)  01/16/13 141 lb (63.957 kg)  12/26/12 141 lb 9.6 oz (64.229 kg)    Physical Exam: Vital signs reviewed MLY:YTKP is a well-developed well-nourished alert cooperative   female who appears her stated age in no acute distress.  HEENT: normocephalic atraumatic , Eyes: PERRL EOM's full, conjunctiva clear, Nares: paten,t no deformity discharge or tenderness., Ears: no deformity EAC's clear TMs with normal landmarks. Mouth: clear OP, no lesions,  edema.  Moist mucous membranes. Dentition in adequate repair. NECK: supple without masses, thyromegaly or bruits. CHEST/PULM:  Clear to auscultation and percussion breath sounds equal no wheeze , rales or rhonchi. No chest wall deformities or tenderness. Breast: normal by inspection . No dimpling, discharge, masses, tenderness or discharge . CV: PMI is nondisplaced, S1 S2 no gallops, murmurs, rubs. Peripheral pulses are full without delay.No JVD .  Breast: normal by inspection . No dimpling, discharge, masses, tenderness or discharge . ABDOMEN: Bowel sounds normal nontender  No guard or rebound, no hepato splenomegal no CVA tenderness.  No hernia. Extremtities:  No clubbing cyanosis or edema, no acute joint swelling or redness no focal atrophy NEURO:  Oriented x3, cranial nerves 3-12 appear to be intact, no obvious focal weakness,gait within normal limits no abnormal reflexes or asymmetrical SKIN: No acute rashes normal turgor, color, no bruising or petechiae. Right face right nasal red flat plaque like without scale or roughtness  PSYCH: Oriented, good eye contact, no obvious depression anxiety, cognition and judgment appear normal. LN: no cervical axillary inguinal adenopathy  Lab Results  Component Value Date   WBC 7.0 06/23/2013   HGB 14.4 06/23/2013   HCT 44.1 06/23/2013   PLT 272.0 06/23/2013   GLUCOSE 93 06/23/2013   CHOL 217* 06/23/2013   TRIG 214.0* 06/23/2013   HDL 55.80 06/23/2013   LDLDIRECT 127.6 06/23/2013   LDLCALC 83 09/13/2007   ALT 21 06/23/2013   AST 22 06/23/2013   NA 140 06/23/2013   K 4.5 06/23/2013   CL 105 06/23/2013   CREATININE 0.8 06/23/2013   BUN 22 06/23/2013   CO2 28 06/23/2013   TSH 3.63 06/23/2013    ASSESSMENT AND PLAN:  Discussed the following assessment and plan:  Visit for preventive health examination  Unspecified essential hypertension  Other and unspecified hyperlipidemia - slightly worse  ls changes   Face lesion - dec in summer  inc in winter not  progressive ? ec like  [facility] Intensify lso no change in meds at this time Patient Care Team: Burnis Medin, MD as PCP - General Almedia Balls, MD (Orthopedic Surgery) Irene Shipper, MD (Gastroenterology) Patient Instructions  Poss cross train exercise   If hips are  persistent or progressive see DR.  Voytek.    lifestyle intervention healthy eating and exercise . And this may  Correct itself.  monitor bp readings to ensure control.    Standley Brooking. Amaal Dimartino M.D.   Pre visit review using our clinic review tool, if applicable. No additional management support is needed unless otherwise documented below in the visit note.

## 2013-06-30 NOTE — Patient Instructions (Addendum)
Poss cross train exercise   If hips are  persistent or progressive see DR.  Voytek.    lifestyle intervention healthy eating and exercise . And this may  Correct itself.  monitor bp readings to ensure control.

## 2013-10-20 ENCOUNTER — Ambulatory Visit (INDEPENDENT_AMBULATORY_CARE_PROVIDER_SITE_OTHER): Payer: BC Managed Care – PPO | Admitting: Physician Assistant

## 2013-10-20 ENCOUNTER — Encounter: Payer: Self-pay | Admitting: Physician Assistant

## 2013-10-20 VITALS — BP 130/82 | HR 92 | Temp 98.7°F | Resp 18 | Wt 134.0 lb

## 2013-10-20 DIAGNOSIS — M549 Dorsalgia, unspecified: Secondary | ICD-10-CM

## 2013-10-20 LAB — POCT URINALYSIS DIPSTICK
Bilirubin, UA: NEGATIVE
Blood, UA: NEGATIVE
Glucose, UA: NEGATIVE
Ketones, UA: NEGATIVE
Leukocytes, UA: NEGATIVE
Nitrite, UA: NEGATIVE
PH UA: 6.5
Protein, UA: NEGATIVE
Spec Grav, UA: 1.015
Urobilinogen, UA: 0.2

## 2013-10-20 LAB — D-DIMER, QUANTITATIVE: D-Dimer, Quant: 0.44 ug/mL-FEU (ref 0.00–0.48)

## 2013-10-20 MED ORDER — IBUPROFEN 600 MG PO TABS: 600.0000 mg | ORAL_TABLET | Freq: Three times a day (TID) | ORAL | Status: DC | PRN

## 2013-10-20 MED ORDER — METHOCARBAMOL 500 MG PO TABS: 500.0000 mg | ORAL_TABLET | Freq: Three times a day (TID) | ORAL | Status: DC | PRN

## 2013-10-20 NOTE — Patient Instructions (Signed)
We will call with the results of your lab work when available.  Robaxin 3 times daily as needed to relieve muscle spasms.  Ibuprofen 600mg  3 times per day with meal and with full glass of water to relieve inflammation and pain.  You can alternate ice and heat as tolerated.  If emergency symptoms discussed during visit develop, seek medical treatment immediately.  Follow up as needed, or if symptoms worsen or persist despite treatment.   Back Pain, Adult Back pain is very common. The pain often gets better over time. The cause of back pain is usually not dangerous. Most people can learn to manage their back pain on their own.  HOME CARE   Stay active. Start with short walks on flat ground if you can. Try to walk farther each day.  Do not sit, drive, or stand in one place for more than 30 minutes. Do not stay in bed.  Do not avoid exercise or work. Activity can help your back heal faster.  Be careful when you bend or lift an object. Bend at your knees, keep the object close to you, and do not twist.  Sleep on a firm mattress. Lie on your side, and bend your knees. If you lie on your back, put a pillow under your knees.  Only take medicines as told by your doctor.  Put ice on the injured area.  Put ice in a plastic bag.  Place a towel between your skin and the bag.  Leave the ice on for 15-20 minutes, 03-04 times a day for the first 2 to 3 days. After that, you can switch between ice and heat packs.  Ask your doctor about back exercises or massage.  Avoid feeling anxious or stressed. Find good ways to deal with stress, such as exercise. GET HELP RIGHT AWAY IF:   Your pain does not go away with rest or medicine.  Your pain does not go away in 1 week.  You have new problems.  You do not feel well.  The pain spreads into your legs.  You cannot control when you poop (bowel movement) or pee (urinate).  Your arms or legs feel weak or lose feeling (numbness).  You feel  sick to your stomach (nauseous) or throw up (vomit).  You have belly (abdominal) pain.  You feel like you may pass out (faint). MAKE SURE YOU:   Understand these instructions.  Will watch your condition.  Will get help right away if you are not doing well or get worse. Document Released: 10/11/2007 Document Revised: 07/17/2011 Document Reviewed: 09/12/2010 [facility] Patient Information 2014 [place].

## 2013-10-20 NOTE — Progress Notes (Signed)
Subjective:    Patient ID: Jacqueline Myers, female    DOB: 19-Dec-1950, 63 y.o.   MRN: [id number]  Back Pain This is a new problem. The current episode started today. The problem occurs constantly. The problem has been gradually improving since onset. The pain is present in the thoracic spine. The quality of the pain is described as stabbing. The pain does not radiate. The pain is at a severity of 6/10. The pain is moderate. The pain is the same all the time. The symptoms are aggravated by stress and twisting. Associated symptoms include chest pain (says it radiates anteriorly into lower chest/epigastric region. At first, hurt to take a breath, has since resolved.), headaches and leg pain (almost constantly, not associated with this episode.). Pertinent negatives include no abdominal pain, bladder incontinence, bowel incontinence, dysuria, fever, numbness, paresis, paresthesias, pelvic pain, perianal numbness, tingling, weakness or weight loss. Risk factors include history of osteoporosis and menopause. Treatments tried: took baby aspirin. The treatment provided mild (has eased over the las few hours, unsure if related to baby aspirin.) relief.  Pt has history of pleurisy, says this is different pain. Pt has history of reflux, says this is different pain, no reflux symptoms, no association with food, no GI symptoms. Alcohol intake is not of concern, pt states she has a glass of wine at Christmas.     Review of Systems  Constitutional: Negative for fever, chills and weight loss.  Respiratory: Negative for shortness of breath.   Cardiovascular: Positive for chest pain (says it radiates anteriorly into lower chest/epigastric region. At first, hurt to take a breath, has since resolved.).  Gastrointestinal: Negative for nausea, vomiting, abdominal pain, diarrhea and bowel incontinence.  Genitourinary: Negative for bladder incontinence, dysuria, urgency, frequency, hematuria, flank pain and pelvic  pain.  Musculoskeletal: Positive for back pain.  Neurological: Positive for headaches. Negative for tingling, weakness, numbness and paresthesias.  All other systems reviewed and are negative.     Past Medical History  Diagnosis Date  . Hypertension   . Hyperlipidemia   . Spinal stenosis   . Osteoarthritis     History   Social History  . Marital Status: Married    Spouse Name: N/A    Number of Children: N/A  . Years of Education: N/A   Occupational History  . Not on file.   Social History Main Topics  . Smoking status: Former Research scientist (life sciences)  . Smokeless tobacco: Never Used  . Alcohol Use: No  . Drug Use: No  . Sexual Activity: Not on file   Other Topics Concern  . 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 Nm myoview ltd  08/2006    normal for abn ekg  . Tonsillectomy      Family History  Problem Relation Age of Onset  . Alcohol abuse Mother   . Stroke Mother   . Hypertension Mother   . Diabetes Mother   . Hyperlipidemia Father   . Breast cancer Sister   . Colon cancer Neg Hx     Allergies  Allergen Reactions  . Simvastatin     REACTION: muscle aches    Current Outpatient Prescriptions on File Prior to Visit  Medication Sig Dispense Refill  .  calcium citrate-vitamin D (CITRACAL+D) 315-200 MG-UNIT per tablet Take 1 tablet by mouth 2 (two) times daily.      . cyclobenzaprine (FLEXERIL) 10 MG tablet TAKE ONE-HALF TO ONE TABLET BY MOUTH THREE TIMES DAILY AS NEEDED FOR MUSCLE SPASM.  90 tablet  0  . fish oil-omega-3 fatty acids 1000 MG capsule Take 4 g by mouth daily.       Marland Kitchen glucosamine-chondroitin 500-400 MG tablet Take 1 tablet by mouth 3 (three) times daily.        Marland Kitchen lisinopril-hydrochlorothiazide (PRINZIDE,ZESTORETIC) 10-12.5 MG per tablet TAKE ONE TABLET BY MOUTH ONCE DAILY  90  tablet  3  . Lutein 20 MG CAPS Take by mouth.      . MULTIPLE VITAMIN PO Take by mouth.         No current facility-administered medications on file prior to visit.    EXAM: BP 130/82  Pulse 92  Temp(Src) 98.7 F (37.1 C) (Oral)  Resp 18  Wt 134 lb (60.782 kg)  SpO2 98%     Objective:   Physical Exam  Nursing note and vitals reviewed. Constitutional: She is oriented to person, place, and time. She appears well-developed and well-nourished. No distress.  HENT:  Head: Normocephalic and atraumatic.  Eyes: Conjunctivae and EOM are normal. Pupils are equal, round, and reactive to light.  Neck: Normal range of motion. Neck supple.  Cardiovascular: Normal rate, regular rhythm, normal heart sounds and intact distal pulses.  Exam reveals no gallop and no friction rub.   No murmur heard. Pulmonary/Chest: Effort normal and breath sounds normal. No respiratory distress. She has no wheezes. She has no rales. She exhibits tenderness (not to palpation, just to percussion, and with twisting.).  Left chest wall tender to percussion/ CVA tenderness.  Abdominal: Soft. Bowel sounds are normal. She exhibits no distension and no mass. There is tenderness (mild epigastric tenderness.). There is no rebound and no guarding.  Musculoskeletal: Normal range of motion. She exhibits tenderness (tenderness to midthoracic back with twisting, no TTP, tenderness to percussion of left posterior chestwall.). She exhibits no edema.  No bony tenderness of the thoracic or lumbar spine.  Neurological: She is alert and oriented to person, place, and time. She exhibits normal muscle tone.  Skin: Skin is warm and dry. No rash noted. She is not diaphoretic. No erythema. No pallor.  Psychiatric: She has a normal mood and affect. Her behavior is normal. Judgment and thought content normal.    Lab Results  Component Value Date   WBC 7.0 06/23/2013   HGB 14.4 06/23/2013   HCT 44.1 06/23/2013   PLT 272.0 06/23/2013   GLUCOSE  93 06/23/2013   CHOL 217* 06/23/2013   TRIG 214.0* 06/23/2013   HDL 55.80 06/23/2013   LDLDIRECT 127.6 06/23/2013   LDLCALC 83 09/13/2007   ALT 21 06/23/2013   AST 22 06/23/2013   NA 140 06/23/2013   K 4.5 06/23/2013   CL 105 06/23/2013   CREATININE 0.8 06/23/2013   BUN 22 06/23/2013   CO2 28 06/23/2013   TSH 3.63 06/23/2013   EKG: normal sinus rhythm, possible RAE. No acute abnormality.      Assessment & Plan:  Harini was seen today for back pain.  Diagnoses and associated orders for this visit:  Back pain Comments: some radiation into chest/epigastric area. EKG showed RAE, will draw D dimer. Likely musculoskeletal, will use Ibuprofen 600 and Robaxin. - POCT urinalysis dipstick - EKG 12-Lead - methocarbamol (ROBAXIN) 500 MG tablet; Take 1  tablet (500 mg total) by mouth every 8 (eight) hours as needed for muscle spasms. - D-dimer, Quantitative - ibuprofen (ADVIL,MOTRIN) 600 MG tablet; Take 1 tablet (600 mg total) by mouth every 8 (eight) hours as needed.   Plan may change depending on results of lab work.  Return precautions provided, and pt handout on back pain.  Plan to follow up as needed, or for worsening or persistent symptoms despite treatment.   Patient Instructions  We will call with the results of your lab work when available.  Robaxin 3 times daily as needed to relieve muscle spasms.  Ibuprofen 600mg  3 times per day with meal and with full glass of water to relieve inflammation and pain.  You can alternate ice and heat as tolerated.  If emergency symptoms discussed during visit develop, seek medical treatment immediately.  Follow up as needed, or if symptoms worsen or persist despite treatment.

## 2013-10-20 NOTE — Progress Notes (Signed)
Pre visit review using our clinic review tool, if applicable. No additional management support is needed unless otherwise documented below in the visit note. 

## 2013-12-22 ENCOUNTER — Other Ambulatory Visit (INDEPENDENT_AMBULATORY_CARE_PROVIDER_SITE_OTHER): Payer: BC Managed Care – PPO

## 2013-12-22 DIAGNOSIS — E039 Hypothyroidism, unspecified: Secondary | ICD-10-CM

## 2013-12-22 LAB — LIPID PANEL
CHOL/HDL RATIO: 4
Cholesterol: 225 mg/dL — ABNORMAL HIGH (ref 0–200)
HDL: 61.1 mg/dL (ref >39.00)
LDL CALC: 139 mg/dL — AB (ref 0–99)
NONHDL: 163.9
Triglycerides: 127 mg/dL (ref 0.0–149.0)
VLDL: 25.4 mg/dL (ref 0.0–40.0)

## 2013-12-29 ENCOUNTER — Ambulatory Visit (INDEPENDENT_AMBULATORY_CARE_PROVIDER_SITE_OTHER): Payer: BC Managed Care – PPO | Admitting: Internal Medicine

## 2013-12-29 ENCOUNTER — Encounter: Payer: Self-pay | Admitting: Internal Medicine

## 2013-12-29 VITALS — BP 134/70 | HR 70 | Temp 98.8°F | Wt 141.0 lb

## 2013-12-29 DIAGNOSIS — I10 Essential (primary) hypertension: Secondary | ICD-10-CM

## 2013-12-29 DIAGNOSIS — Z23 Encounter for immunization: Secondary | ICD-10-CM

## 2013-12-29 DIAGNOSIS — M1990 Unspecified osteoarthritis, unspecified site: Secondary | ICD-10-CM

## 2013-12-29 DIAGNOSIS — E785 Hyperlipidemia, unspecified: Secondary | ICD-10-CM

## 2013-12-29 MED ORDER — CYCLOBENZAPRINE HCL 10 MG PO TABS: ORAL_TABLET | ORAL | Status: DC

## 2013-12-29 MED ORDER — DICLOFENAC SODIUM 1 % TD GEL: 4.0000 g | Freq: Four times a day (QID) | TRANSDERMAL | Status: DC | PRN

## 2013-12-29 NOTE — Patient Instructions (Addendum)
  Continue lifestyle intervention healthy eating and exercise . Exercise can cross train.    Lipids  Are better  Than before  Framingham score is   4 %  10 year risk of cardiovascular event.  Can try voltaren  topical on knee joint.

## 2013-12-29 NOTE — Progress Notes (Signed)
Chief Complaint  Patient presents with  . Follow-up    HPI: Jacqueline Myers  comes in today for follow up of  multiple medical problems. lipids  Walking but knee problematic and hips some pain  After 3/4 walking Shots in knees  per dr [name] not that helpful.  Had elliptical but not using at this time  Not checking Back  Using flexeril at night may get new matresss Taking aleve 2 per day but stopped to prn because of the news saying a risk of heart attack over other meds .  ROS: See pertinent positives and negatives per HPI.  Past Medical History  Diagnosis Date  . Hypertension   . Hyperlipidemia   . Spinal stenosis   . Osteoarthritis     Family History  Problem Relation Age of Onset  . Alcohol abuse Mother   . Stroke Mother   . Hypertension Mother   . Diabetes Mother   . Hyperlipidemia Father   . Breast cancer Sister   . Colon cancer Neg Hx     History   Social History  . Marital Status: Married    Spouse Name: N/A    Number of Children: N/A  . Years of Education: N/A   Social History Main Topics  . Smoking status: Former Research scientist (life sciences)  . Smokeless tobacco: Never Used  . Alcohol Use: No  . Drug Use: No  . Sexual Activity: None   Other Topics Concern  . 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 calcium citrate-vitamin D (CITRACAL+D) 315-200 MG-UNIT per tablet Take 1 tablet by mouth 2 (two) times daily.  . cyclobenzaprine (FLEXERIL) 10 MG tablet TAKE ONE-HALF TO ONE TABLET BY MOUTH THREE TIMES DAILY AS NEEDED FOR MUSCLE SPASM.  . fish oil-omega-3 fatty acids 1000 MG capsule Take 4 g by mouth daily.   Marland Kitchen glucosamine-chondroitin 500-400 MG tablet Take 1 tablet by mouth 3 (three) times daily.    Marland Kitchen lisinopril-hydrochlorothiazide (PRINZIDE,ZESTORETIC) 10-12.5 MG per tablet  TAKE ONE TABLET BY MOUTH ONCE DAILY  . Lutein 20 MG CAPS Take by mouth.  . Magnesium 250 MG TABS Take 1 tablet by mouth daily.  . [DISCONTINUED] cyclobenzaprine (FLEXERIL) 10 MG tablet TAKE ONE-HALF TO ONE TABLET BY MOUTH THREE TIMES DAILY AS NEEDED FOR MUSCLE SPASM.  Marland Kitchen diclofenac sodium (VOLTAREN) 1 % GEL Apply 4 g topically 4 (four) times daily as needed (for knee arthritis).  . [DISCONTINUED] ibuprofen (ADVIL,MOTRIN) 600 MG tablet Take 1 tablet (600 mg total) by mouth every 8 (eight) hours as needed.  . [DISCONTINUED] methocarbamol (ROBAXIN) 500 MG tablet Take 1 tablet (500 mg total) by mouth every 8 (eight) hours as needed for muscle spasms.  . [DISCONTINUED] MULTIPLE VITAMIN PO Take by mouth.      EXAM:  BP 134/70  Pulse 70  Temp(Src) 98.8 F (37.1 C)  Wt 141 lb (63.957 kg)  Body mass index is 24.98 kg/(m^2).  GENERAL: vitals reviewed and listed above, alert, oriented, appears well hydrated and in no acute distress HEENT: atraumatic, conjunctiva  clear, no obvious abnormalities on inspection of external nose and ears NECK: no obvious masses on inspection palpation  MS: moves all extremities without noticeable focal  abnormality PSYCH: pleasant and cooperative, no  obvious depression or anxiety Lab Results  Component Value Date   WBC 7.0 06/23/2013   HGB 14.4 06/23/2013   HCT 44.1 06/23/2013   PLT 272.0 06/23/2013   GLUCOSE 93 06/23/2013   CHOL 225* 12/22/2013   TRIG 127.0 12/22/2013   HDL 61.10 12/22/2013   LDLDIRECT 127.6 06/23/2013   LDLCALC 139* 12/22/2013   ALT 21 06/23/2013   AST 22 06/23/2013   NA 140 06/23/2013   K 4.5 06/23/2013   CL 105 06/23/2013   CREATININE 0.8 06/23/2013   BUN 22 06/23/2013   CO2 28 06/23/2013   TSH 3.63 06/23/2013    ASSESSMENT AND PLAN:  Discussed the following assessment and plan:   Other and unspecified hyperlipidemia - tg better  but knees limiting.  cant take simva consider other if needed 4 % framinghamscore at this time  Need for  prophylactic vaccination and inoculation against influenza - Plan: Flu Vaccine QUAD 36+ mos PF IM (Fluarix Quad PF)  Unspecified essential hypertension  Osteoarthrosis, unspecified whether generalized or localized, unspecified site - problematic knee pain try voltaren gel minimize nsaid systemic . risk benefit disc  Framingham risk 4 % at this time  Refill flexeril for hx use  Back hygiene -Patient advised to return or notify health care team  if symptoms worsen ,persist or new concerns arise.  Patient Instructions   Continue lifestyle intervention healthy eating and exercise . Exercise can cross train.    Lipids  Are better  Than before  Framingham score is   4 %  10 year risk of cardiovascular event.  Can try voltaren  topical on knee joint.     Standley Brooking. Krystyl Cannell M.D. Total visit 47mins > 50% spent counseling and coordinating care

## 2013-12-29 NOTE — Progress Notes (Signed)
Pre visit review using our clinic review tool, if applicable. No additional management support is needed unless otherwise documented below in the visit note. 

## 2013-12-30 ENCOUNTER — Telehealth: Payer: Self-pay | Admitting: Internal Medicine

## 2013-12-30 NOTE — Telephone Encounter (Signed)
Relevant patient education assigned to patient using Emmi. ° °

## 2014-01-05 ENCOUNTER — Telehealth: Payer: Self-pay | Admitting: Internal Medicine

## 2014-01-05 NOTE — Telephone Encounter (Signed)
I received a denial from [facility] for Voltaren.  Pt must have tried and failed or have contraindication to an oral NSAID first.

## 2014-01-05 NOTE — Telephone Encounter (Signed)
Patient should not have to take oral nsaid on a regular basis because of the increased cardiovascular risk in her situation. She is on acei and diutetic and nsaid could  increase her risk of renal failure  decompensation

## 2014-01-06 NOTE — Telephone Encounter (Signed)
Elta [place] the Appeal "URGENT" and fax it to [phone] Attn: Appeals Department.

## 2014-01-06 NOTE — Telephone Encounter (Signed)
If you want to submit an Appeal to the denial, I will give you the needed contact information.

## 2014-01-13 NOTE — Telephone Encounter (Signed)
Letter sent to the appeals department.  Received confirmation that the fax was received.

## 2014-04-23 LAB — HM MAMMOGRAPHY

## 2014-04-27 ENCOUNTER — Encounter: Payer: Self-pay | Admitting: Family Medicine

## 2014-05-12 ENCOUNTER — Encounter: Payer: Self-pay | Admitting: Internal Medicine

## 2014-06-26 ENCOUNTER — Other Ambulatory Visit (INDEPENDENT_AMBULATORY_CARE_PROVIDER_SITE_OTHER): Payer: 59

## 2014-06-26 DIAGNOSIS — Z0000 Encounter for general adult medical examination without abnormal findings: Secondary | ICD-10-CM

## 2014-06-26 LAB — CBC WITH DIFFERENTIAL/PLATELET
Basophils Absolute: 0 10*3/uL (ref 0.0–0.1)
Basophils Relative: 0.5 % (ref 0.0–3.0)
EOS ABS: 0.3 10*3/uL (ref 0.0–0.7)
Eosinophils Relative: 4.5 % (ref 0.0–5.0)
HCT: 42.3 % (ref 36.0–46.0)
HEMOGLOBIN: 14.3 g/dL (ref 12.0–15.0)
LYMPHS PCT: 31.6 % (ref 12.0–46.0)
Lymphs Abs: 2.2 10*3/uL (ref 0.7–4.0)
MCHC: 33.8 g/dL (ref 30.0–36.0)
MCV: 86.4 fl (ref 78.0–100.0)
Monocytes Absolute: 0.6 10*3/uL (ref 0.1–1.0)
Monocytes Relative: 8.4 % (ref 3.0–12.0)
NEUTROS ABS: 3.8 10*3/uL (ref 1.4–7.7)
Neutrophils Relative %: 55 % (ref 43.0–77.0)
PLATELETS: 288 10*3/uL (ref 150.0–400.0)
RBC: 4.9 Mil/uL (ref 3.87–5.11)
RDW: 14.5 % (ref 11.5–15.5)
WBC: 6.9 10*3/uL (ref 4.0–10.5)

## 2014-06-26 LAB — BASIC METABOLIC PANEL WITH GFR
BUN: 21 mg/dL (ref 6–23)
CO2: 30 meq/L (ref 19–32)
Calcium: 9.9 mg/dL (ref 8.4–10.5)
Chloride: 103 meq/L (ref 96–112)
Creatinine, Ser: 0.9 mg/dL (ref 0.40–1.20)
GFR: 66.88 mL/min
Glucose, Bld: 97 mg/dL (ref 70–99)
Potassium: 4.7 meq/L (ref 3.5–5.1)
Sodium: 141 meq/L (ref 135–145)

## 2014-06-26 LAB — LIPID PANEL
CHOL/HDL RATIO: 4
CHOLESTEROL: 239 mg/dL — AB (ref 0–200)
HDL: 60.9 mg/dL (ref >39.00)
LDL Cholesterol: 156 mg/dL — ABNORMAL HIGH (ref 0–99)
NonHDL: 178.1
TRIGLYCERIDES: 111 mg/dL (ref 0.0–149.0)
VLDL: 22.2 mg/dL (ref 0.0–40.0)

## 2014-06-26 LAB — HEPATIC FUNCTION PANEL
ALK PHOS: 65 U/L (ref 39–117)
ALT: 23 U/L (ref 0–35)
AST: 29 U/L (ref 0–37)
Albumin: 4.4 g/dL (ref 3.5–5.2)
Bilirubin, Direct: 0.1 mg/dL (ref 0.0–0.3)
Total Bilirubin: 0.6 mg/dL (ref 0.2–1.2)
Total Protein: 7.2 g/dL (ref 6.0–8.3)

## 2014-06-26 LAB — TSH: TSH: 3.88 u[IU]/mL (ref 0.35–4.50)

## 2014-07-03 ENCOUNTER — Encounter: Payer: Self-pay | Admitting: Internal Medicine

## 2014-07-03 ENCOUNTER — Ambulatory Visit (INDEPENDENT_AMBULATORY_CARE_PROVIDER_SITE_OTHER): Payer: 59 | Admitting: Internal Medicine

## 2014-07-03 ENCOUNTER — Ambulatory Visit (INDEPENDENT_AMBULATORY_CARE_PROVIDER_SITE_OTHER)
Admission: RE | Admit: 2014-07-03 | Discharge: 2014-07-03 | Disposition: A | Discharge status: Home / Self Care | Payer: 59 | Source: Ambulatory Visit | Attending: Internal Medicine | Admitting: Internal Medicine

## 2014-07-03 VITALS — BP 152/70 | Temp 98.0°F | Ht 63.0 in | Wt 140.6 lb

## 2014-07-03 DIAGNOSIS — R05 Cough: Secondary | ICD-10-CM

## 2014-07-03 DIAGNOSIS — G4700 Insomnia, unspecified: Secondary | ICD-10-CM

## 2014-07-03 DIAGNOSIS — I10 Essential (primary) hypertension: Secondary | ICD-10-CM

## 2014-07-03 DIAGNOSIS — R0600 Dyspnea, unspecified: Secondary | ICD-10-CM

## 2014-07-03 DIAGNOSIS — R053 Chronic cough: Secondary | ICD-10-CM

## 2014-07-03 DIAGNOSIS — Z23 Encounter for immunization: Secondary | ICD-10-CM

## 2014-07-03 DIAGNOSIS — Z0000 Encounter for general adult medical examination without abnormal findings: Secondary | ICD-10-CM

## 2014-07-03 DIAGNOSIS — Z87891 Personal history of nicotine dependence: Secondary | ICD-10-CM

## 2014-07-03 DIAGNOSIS — E785 Hyperlipidemia, unspecified: Secondary | ICD-10-CM

## 2014-07-03 DIAGNOSIS — Z6379 Other stressful life events affecting family and household: Secondary | ICD-10-CM

## 2014-07-03 MED ORDER — LOSARTAN POTASSIUM-HCTZ 50-12.5 MG PO TABS: 1.0000 | ORAL_TABLET | Freq: Every day | ORAL | Status: DC

## 2014-07-03 NOTE — Progress Notes (Signed)
Pre visit review using our clinic review tool, if applicable. No additional management support is needed unless otherwise documented below in the visit note.  Chief Complaint  Patient presents with  . Annual Exam  . Hypertension  . Cough    HPI: Patient  Jacqueline Myers  64 y.o. comes in today for Preventive Health Care visit , and alos a number of new concerns  Nagging cough  And sob when exercising numbness in fingers   Hard to catch a deep breath when wears a bra.  And wakes up at 3 pm   Stress.   No osa . Remote hx of tobacco from age 19 - 81s  No fever.  Under a lot of stress.  Doing  .  Care for mom .    Knee limiting  Sleep not as good stress no oca sx noted.  BP readings for review controlled about 65 % time 12/27 elevated Health Maintenance  Topic Date Due  . HIV Screening  11/25/1965  . INFLUENZA VACCINE  12/07/2014  . MAMMOGRAM  04/23/2016  . COLONOSCOPY  01/17/2023  . TETANUS/TDAP  07/03/2024  . ZOSTAVAX  Completed   Health Maintenance Review LIFESTYLE:  Exercise:  Knees hips  Limiting  Tobacco/ETS:no Alcohol:  no Sugar beverages: no Sleep: erratic  Drug use: no Colonoscopy: utd  PAP:  utd  MAMMO:yes.   ROS: sob on exertion cough upper no hemoptysis  GEN/ HEENT: No fever, significant weight changes sweats headaches vision problems hearing changes, CV/ PULM; No chest pain  syncope,edema  change in exercise tolerance. GI /GU: No adominal pain, vomiting, change in bowel habits. No blood in the stool. No significant GU symptoms. SKIN/HEME: ,no acute skin rashes suspicious lesions or bleeding. No lymphadenopathy, nodules, masses.  NEURO/ PSYCH:  No neurologic signs such as weakness  Numbness tingling in hands at times with use not in feet  No depression anxiety. But stressed  IMM/ Allergy: No unusual infections.  Allergy .   REST of 12 system review negative except as per HPI Past Medical History  Diagnosis Date  . Hypertension   . Hyperlipidemia   .  Spinal stenosis   . Osteoarthritis     Past Surgical History  Procedure Laterality Date  . Nm myoview ltd  08/2006    normal for abn ekg  . Tonsillectomy      Family History  Problem Relation Age of Onset  . Alcohol abuse Mother   . Stroke Mother   . Hypertension Mother   . Diabetes Mother   . Hyperlipidemia Father   . Breast cancer Sister   . Colon cancer Neg Hx     History   Social History  . Marital Status: Married    Spouse Name: N/A  . Number of Children: N/A  . Years of Education: N/A   Social History Main Topics  . Smoking status: Former Research scientist (life sciences)  . Smokeless tobacco: Never Used  . Alcohol Use: No  . Drug Use: No  . Sexual Activity: Not on file   Other Topics Concern  . 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 calcium citrate-vitamin D (CITRACAL+D) 315-200 MG-UNIT per tablet Take 1 tablet by mouth 2 (two) times daily.  . fish  oil-omega-3 fatty acids 1000 MG capsule Take 4 g by mouth daily.   . Multiple Vitamins-Minerals (CENTRUM SILVER ADULT 50+ PO) Take by mouth.  . [DISCONTINUED] lisinopril-hydrochlorothiazide (PRINZIDE,ZESTORETIC) 10-12.5 MG per tablet TAKE ONE TABLET BY MOUTH ONCE DAILY  . cyclobenzaprine (FLEXERIL) 10 MG tablet TAKE ONE-HALF TO ONE TABLET BY MOUTH THREE TIMES DAILY AS NEEDED FOR MUSCLE SPASM. (Patient not taking: Reported on 07/03/2014)  . diclofenac sodium (VOLTAREN) 1 % GEL Apply 4 g topically 4 (four) times daily as needed (for knee arthritis). (Patient not taking: Reported on 07/03/2014)  . losartan-hydrochlorothiazide (HYZAAR) 50-12.5 MG per tablet Take 1 tablet by mouth daily.  . [DISCONTINUED] glucosamine-chondroitin 500-400 MG tablet Take 1 tablet by mouth 3 (three) times daily.    . [DISCONTINUED] Lutein 20 MG CAPS Take by mouth.  .  [DISCONTINUED] Magnesium 250 MG TABS Take 1 tablet by mouth daily.    EXAM:  BP 152/70 mmHg  Temp(Src) 98 F (36.7 C) (Oral)  Ht 5\' 3"  (1.6 m)  Wt 140 lb 9.6 oz (63.776 kg)  BMI 24.91 kg/m2  Body mass index is 24.91 kg/(m^2).  Physical Exam: Vital signs reviewed EPP:IRJJ is a well-developed well-nourished alert cooperative    who appearsr stated age in no acute distress.  HEENT: normocephalic atraumatic , Eyes: PERRL EOM's full, conjunctiva clear, Nares: paten,t no deformity discharge or tenderness., Ears: no deformity EAC's clear TMs with normal landmarks. Mouth: clear OP, no lesions, edema.  Moist mucous membranes. Dentition in adequate repair. NECK: supple without masses, thyromegaly or bruits. CHEST/PULM:  Clear to auscultation and percussion breath sounds equal no wheeze , rales or rhonchi. No chest wall deformities or tenderness. Breast: normal by inspection . No dimpling, discharge, masses, tenderness or discharge . CV: PMI is nondisplaced, S1 S2 no gallops, murmurs, rubs. Peripheral pulses are full without delay.No JVD .  ABDOMEN: Bowel sounds normal nontender  No guard or rebound, no hepato splenomegal no CVA tenderness.  No hernia. Extremtities:  No clubbing cyanosis or edema, no acute joint swelling or redness no focal atrophy NEURO:  Oriented x3, cranial nerves 3-12 appear to be intact, no obvious focal weakness,gait within normal limits no abnormal reflexes or asymmetrical SKIN: No acute rashes normal turgor, color, no bruising or petechiae. PSYCH: Oriented, good eye contact, no obvious depression anxiety, cognition and judgment appear normal. LN: no cervical axillary inguinal adenopathy  Lab Results  Component Value Date   WBC 6.9 06/26/2014   HGB 14.3 06/26/2014   HCT 42.3 06/26/2014   PLT 288.0 06/26/2014   GLUCOSE 97 06/26/2014   CHOL 239* 06/26/2014   TRIG 111.0 06/26/2014   HDL 60.90 06/26/2014   LDLDIRECT 127.6 06/23/2013   LDLCALC 156* 06/26/2014   ALT 23  06/26/2014   AST 29 06/26/2014   NA 141 06/26/2014   K 4.7 06/26/2014   CL 103 06/26/2014   CREATININE 0.90 06/26/2014   BUN 21 06/26/2014   CO2 30 06/26/2014   TSH 3.88 06/26/2014  had ekg 2015 with palapitaiton  Nl except  Inc p poss lae  ASSESSMENT AND PLAN:  Discussed the following assessment and plan:  Visit for preventive health examination  Essential hypertension - reviewd intensify plan  Cough, persistent - acei possibly contributing  change  cxray and fu - Plan: DG Chest 2 View  Dyspnea - uncertain if rad lung related  decong etc  order pfts cxray intsific bp and fu mom w/pvd Inc lipids; now chf ;consider echo stresstst etc - Plan: DG Chest  2 View  Stress due to illness of family member - mom is doidng better but stress of total caretaking cooking etc   Hyperlipidemia - ilsi  consdier meds based on fam hx   Hx of tobacco use - age 55 to age 65s    Insomnia - light sleeper aggravated by external stress factors  Need for Tdap vaccination - Plan: Tdap vaccine greater than or equal to 7yo IM  Patient Care Team: Burnis Medin, MD as PCP - General Almedia Balls, MD (Orthopedic Surgery) Irene Shipper, MD (Gastroenterology) Patient Instructions  change blood pressure medication for control and  To help the cough. Get chest x ray and will be contacted about  Lung function tests.  Continue lifestyle intervention healthy eating and exercise . In the interim . Sleep hygiene    Yoga may help stress and  Sleep  contniue monitor blood pressure readings .  ROV in about 6-8 weeks or as needed .   Insomnia Insomnia is frequent trouble falling and/or staying asleep. Insomnia can be a long term problem or a short term problem. Both are common. Insomnia can be a short term problem when the wakefulness is related to a certain stress or worry. Long term insomnia is often related to ongoing stress during waking hours and/or poor sleeping habits. Overtime, sleep deprivation itself can  make the problem worse. Every little thing feels more severe because you are overtired and your ability to cope is decreased. CAUSES   Stress, anxiety, and depression.  Poor sleeping habits.  Distractions such as TV in the bedroom.  Naps close to bedtime.  Engaging in emotionally charged conversations before bed.  Technical reading before sleep.  Alcohol and other sedatives. They may make the problem worse. They can hurt normal sleep patterns and normal dream activity.  Stimulants such as caffeine for several hours prior to bedtime.  Pain syndromes and shortness of breath can cause insomnia.  Exercise late at night.  Changing time zones may cause sleeping problems (jet lag). It is sometimes helpful to have someone observe your sleeping patterns. They should look for periods of not breathing during the night (sleep apnea). They should also look to see how long those periods last. If you live alone or observers are uncertain, you can also be observed at a sleep clinic where your sleep patterns will be professionally monitored. Sleep apnea requires a checkup and treatment. Give your caregivers your medical history. Give your caregivers observations your family has made about your sleep.  SYMPTOMS   Not feeling rested in the morning.  Anxiety and restlessness at bedtime.  Difficulty falling and staying asleep. TREATMENT   Your caregiver may prescribe treatment for an underlying medical disorders. Your caregiver can give advice or help if you are using alcohol or other drugs for self-medication. Treatment of underlying problems will usually eliminate insomnia problems.  Medications can be prescribed for short time use. They are generally not recommended for lengthy use.  Over-the-counter sleep medicines are not recommended for lengthy use. They can be habit forming.  You can promote easier sleeping by making lifestyle changes such as:  Using relaxation techniques that help with  breathing and reduce muscle tension.  Exercising earlier in the day.  Changing your diet and the time of your last meal. No night time snacks.  Establish a regular time to go to bed.  Counseling can help with stressful problems and worry.  Soothing music and white noise may be helpful if there are  background noises you cannot remove.  Stop tedious detailed work at least one hour before bedtime. HOME CARE INSTRUCTIONS   Keep a diary. Inform your caregiver about your progress. This includes any medication side effects. See your caregiver regularly. Take note of:  Times when you are asleep.  Times when you are awake during the night.  The quality of your sleep.  How you feel the next day. This information will help your caregiver care for you.  Get out of bed if you are still awake after 15 minutes. Read or do some quiet activity. Keep the lights down. Wait until you feel sleepy and go back to bed.  Keep regular sleeping and waking hours. Avoid naps.  Exercise regularly.  Avoid distractions at bedtime. Distractions include watching television or engaging in any intense or detailed activity like attempting to balance the household checkbook.  Develop a bedtime ritual. Keep a familiar routine of bathing, brushing your teeth, climbing into bed at the same time each night, listening to soothing music. Routines increase the success of falling to sleep faster.  Use relaxation techniques. This can be using breathing and muscle tension release routines. It can also include visualizing peaceful scenes. You can also help control troubling or intruding thoughts by keeping your mind occupied with boring or repetitive thoughts like the old concept of counting sheep. You can make it more creative like imagining planting one beautiful flower after another in your backyard garden.  During your day, work to eliminate stress. When this is not possible use some of the previous suggestions to help  reduce the anxiety that accompanies stressful situations. MAKE SURE YOU:   Understand these instructions.  Will watch your condition.  Will get help right away if you are not doing well or get worse. Document Released: 04/21/2000 Document Revised: 07/17/2011 Document Reviewed: 05/22/2007 [facility] Patient Information 2015 [place], [place]. This information is not intended to replace advice given to you by your health care provider. Make sure you discuss any questions you have with your health care provider.     Standley Brooking. Kenzlie Disch M.D.

## 2014-07-03 NOTE — Patient Instructions (Addendum)
change blood pressure medication for control and  To help the cough. Get chest x ray and will be contacted about  Lung function tests.  Continue lifestyle intervention healthy eating and exercise . In the interim . Sleep hygiene    Yoga may help stress and  Sleep  contniue monitor blood pressure readings .  ROV in about 6-8 weeks or as needed .   Insomnia Insomnia is frequent trouble falling and/or staying asleep. Insomnia can be a long term problem or a short term problem. Both are common. Insomnia can be a short term problem when the wakefulness is related to a certain stress or worry. Long term insomnia is often related to ongoing stress during waking hours and/or poor sleeping habits. Overtime, sleep deprivation itself can make the problem worse. Every little thing feels more severe because you are overtired and your ability to cope is decreased. CAUSES   Stress, anxiety, and depression.  Poor sleeping habits.  Distractions such as TV in the bedroom.  Naps close to bedtime.  Engaging in emotionally charged conversations before bed.  Technical reading before sleep.  Alcohol and other sedatives. They may make the problem worse. They can hurt normal sleep patterns and normal dream activity.  Stimulants such as caffeine for several hours prior to bedtime.  Pain syndromes and shortness of breath can cause insomnia.  Exercise late at night.  Changing time zones may cause sleeping problems (jet lag). It is sometimes helpful to have someone observe your sleeping patterns. They should look for periods of not breathing during the night (sleep apnea). They should also look to see how long those periods last. If you live alone or observers are uncertain, you can also be observed at a sleep clinic where your sleep patterns will be professionally monitored. Sleep apnea requires a checkup and treatment. Give your caregivers your medical history. Give your caregivers observations your family  has made about your sleep.  SYMPTOMS   Not feeling rested in the morning.  Anxiety and restlessness at bedtime.  Difficulty falling and staying asleep. TREATMENT   Your caregiver may prescribe treatment for an underlying medical disorders. Your caregiver can give advice or help if you are using alcohol or other drugs for self-medication. Treatment of underlying problems will usually eliminate insomnia problems.  Medications can be prescribed for short time use. They are generally not recommended for lengthy use.  Over-the-counter sleep medicines are not recommended for lengthy use. They can be habit forming.  You can promote easier sleeping by making lifestyle changes such as:  Using relaxation techniques that help with breathing and reduce muscle tension.  Exercising earlier in the day.  Changing your diet and the time of your last meal. No night time snacks.  Establish a regular time to go to bed.  Counseling can help with stressful problems and worry.  Soothing music and white noise may be helpful if there are background noises you cannot remove.  Stop tedious detailed work at least one hour before bedtime. HOME CARE INSTRUCTIONS   Keep a diary. Inform your caregiver about your progress. This includes any medication side effects. See your caregiver regularly. Take note of:  Times when you are asleep.  Times when you are awake during the night.  The quality of your sleep.  How you feel the next day. This information will help your caregiver care for you.  Get out of bed if you are still awake after 15 minutes. Read or do some quiet activity. Keep  the lights down. Wait until you feel sleepy and go back to bed.  Keep regular sleeping and waking hours. Avoid naps.  Exercise regularly.  Avoid distractions at bedtime. Distractions include watching television or engaging in any intense or detailed activity like attempting to balance the household checkbook.  Develop a  bedtime ritual. Keep a familiar routine of bathing, brushing your teeth, climbing into bed at the same time each night, listening to soothing music. Routines increase the success of falling to sleep faster.  Use relaxation techniques. This can be using breathing and muscle tension release routines. It can also include visualizing peaceful scenes. You can also help control troubling or intruding thoughts by keeping your mind occupied with boring or repetitive thoughts like the old concept of counting sheep. You can make it more creative like imagining planting one beautiful flower after another in your backyard garden.  During your day, work to eliminate stress. When this is not possible use some of the previous suggestions to help reduce the anxiety that accompanies stressful situations. MAKE SURE YOU:   Understand these instructions.  Will watch your condition.  Will get help right away if you are not doing well or get worse. Document Released: 04/21/2000 Document Revised: 07/17/2011 Document Reviewed: 05/22/2007 [facility] Patient Information 2015 [place], [place]. This information is not intended to replace advice given to you by your health care provider. Make sure you discuss any questions you have with your health care provider.

## 2014-07-05 DIAGNOSIS — Z6379 Other stressful life events affecting family and household: Secondary | ICD-10-CM | POA: Insufficient documentation

## 2014-07-05 DIAGNOSIS — E785 Hyperlipidemia, unspecified: Secondary | ICD-10-CM | POA: Insufficient documentation

## 2014-07-05 DIAGNOSIS — Z87891 Personal history of nicotine dependence: Secondary | ICD-10-CM | POA: Insufficient documentation

## 2014-07-05 DIAGNOSIS — R05 Cough: Secondary | ICD-10-CM | POA: Insufficient documentation

## 2014-07-05 DIAGNOSIS — G4700 Insomnia, unspecified: Secondary | ICD-10-CM | POA: Insufficient documentation

## 2014-07-05 DIAGNOSIS — I10 Essential (primary) hypertension: Secondary | ICD-10-CM | POA: Insufficient documentation

## 2014-07-05 DIAGNOSIS — R0600 Dyspnea, unspecified: Secondary | ICD-10-CM | POA: Insufficient documentation

## 2014-07-05 DIAGNOSIS — R053 Chronic cough: Secondary | ICD-10-CM | POA: Insufficient documentation

## 2014-07-05 HISTORY — DX: Other stressful life events affecting family and household: Z63.79

## 2014-07-08 ENCOUNTER — Ambulatory Visit (INDEPENDENT_AMBULATORY_CARE_PROVIDER_SITE_OTHER): Payer: 59 | Admitting: Internal Medicine

## 2014-07-08 DIAGNOSIS — R053 Chronic cough: Secondary | ICD-10-CM

## 2014-07-08 DIAGNOSIS — R0600 Dyspnea, unspecified: Secondary | ICD-10-CM

## 2014-07-08 DIAGNOSIS — R05 Cough: Secondary | ICD-10-CM

## 2014-07-08 DIAGNOSIS — Z87891 Personal history of nicotine dependence: Secondary | ICD-10-CM

## 2014-07-08 LAB — PULMONARY FUNCTION TEST
DL/VA % pred: 115 %
DL/VA: 5.53 ml/min/mmHg/L
DLCO UNC % PRED: 105 %
DLCO UNC: 25.68 ml/min/mmHg
FEF 25-75 POST: 2.02 L/s
FEF 25-75 Pre: 1.86 L/sec
FEF2575-%Change-Post: 8 %
FEF2575-%PRED-POST: 93 %
FEF2575-%PRED-PRE: 86 %
FEV1-%CHANGE-POST: 3 %
FEV1-%PRED-PRE: 98 %
FEV1-%Pred-Post: 101 %
FEV1-PRE: 2.39 L
FEV1-Post: 2.47 L
FEV1FVC-%Change-Post: 4 %
FEV1FVC-%PRED-PRE: 98 %
FEV6-%CHANGE-POST: 0 %
FEV6-%PRED-POST: 102 %
FEV6-%Pred-Pre: 102 %
FEV6-POST: 3.11 L
FEV6-Pre: 3.13 L
FEV6FVC-%CHANGE-POST: 0 %
FEV6FVC-%Pred-Post: 104 %
FEV6FVC-%Pred-Pre: 103 %
FVC-%CHANGE-POST: -1 %
FVC-%Pred-Post: 98 %
FVC-%Pred-Pre: 99 %
FVC-POST: 3.12 L
FVC-Pre: 3.15 L
POST FEV1/FVC RATIO: 79 %
PRE FEV1/FVC RATIO: 76 %
Post FEV6/FVC ratio: 100 %
Pre FEV6/FVC Ratio: 100 %
RV % PRED: 84 %
RV: 1.76 L
TLC % pred: 101 %
TLC: 5.14 L

## 2014-07-08 NOTE — Progress Notes (Signed)
PFT done today. 

## 2014-07-15 ENCOUNTER — Encounter: Payer: Self-pay | Admitting: Internal Medicine

## 2014-09-01 ENCOUNTER — Ambulatory Visit: Payer: 59 | Admitting: Internal Medicine

## 2014-09-02 ENCOUNTER — Ambulatory Visit (INDEPENDENT_AMBULATORY_CARE_PROVIDER_SITE_OTHER): Payer: 59 | Admitting: Internal Medicine

## 2014-09-02 ENCOUNTER — Encounter: Payer: Self-pay | Admitting: Internal Medicine

## 2014-09-02 VITALS — BP 116/60 | Temp 99.0°F | Ht 63.0 in | Wt 142.2 lb

## 2014-09-02 DIAGNOSIS — R053 Chronic cough: Secondary | ICD-10-CM

## 2014-09-02 DIAGNOSIS — T464X5A Adverse effect of angiotensin-converting-enzyme inhibitors, initial encounter: Secondary | ICD-10-CM | POA: Insufficient documentation

## 2014-09-02 DIAGNOSIS — J3089 Other allergic rhinitis: Secondary | ICD-10-CM | POA: Diagnosis not present

## 2014-09-02 DIAGNOSIS — I10 Essential (primary) hypertension: Secondary | ICD-10-CM

## 2014-09-02 DIAGNOSIS — R058 Other specified cough: Secondary | ICD-10-CM | POA: Insufficient documentation

## 2014-09-02 DIAGNOSIS — R05 Cough: Secondary | ICD-10-CM | POA: Diagnosis not present

## 2014-09-02 NOTE — Patient Instructions (Addendum)
Continue change BP meds .  Try nasal cortisone  nasacort and flonase every day  to control allergy sx .  Preventive visit when due in February 2017.

## 2014-09-02 NOTE — Progress Notes (Signed)
Pre visit review using our clinic review tool, if applicable. No additional management support is needed unless otherwise documented below in the visit note.   Chief Complaint  Patient presents with  . Follow-up    bp and cough    HPI: Jacqueline Myers 64 y.o.  Since change from acei to losartan cough has improved  And bp seems opk  No nocturnal  cough  BP:   130s over 70 ocass 140.   ocass allergy sx no meds  Nasal stuffiness ocass afrin 3 mile walk recently. and having knee issues  And hips .   Dr.  Lynann Myers  All she can do may need to see surgeon. ROS: See pertinent positives and negatives per HPI.  Past Medical History  Diagnosis Date  . Hypertension   . Hyperlipidemia   . Spinal stenosis   . Osteoarthritis     Family History  Problem Relation Age of Onset  . Alcohol abuse Mother   . Stroke Mother   . Hypertension Mother   . Diabetes Mother   . Hyperlipidemia Father   . Breast cancer Sister   . Colon cancer Neg Hx     History   Social History  . Marital Status: Married    Spouse Name: N/A  . Number of Children: N/A  . Years of Education: N/A   Social History Main Topics  . Smoking status: Former Research scientist (life sciences)  . Smokeless tobacco: Never Used  . Alcohol Use: No  . Drug Use: No  . Sexual Activity: Not on file   Other Topics Concern  . 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 calcium citrate-vitamin D (CITRACAL+D) 315-200 MG-UNIT per tablet Take 1 tablet by mouth 2 (two) times daily.  . cyclobenzaprine (FLEXERIL) 10 MG tablet TAKE ONE-HALF TO ONE TABLET BY MOUTH THREE TIMES DAILY AS NEEDED FOR MUSCLE SPASM.  . fish oil-omega-3 fatty acids 1000 MG capsule Take 4 g by mouth daily.   Marland Kitchen losartan-hydrochlorothiazide (HYZAAR) 50-12.5 MG per tablet Take 1 tablet by mouth  daily.  . Multiple Vitamins-Minerals (CENTRUM SILVER ADULT 50+ PO) Take by mouth.  . diclofenac sodium (VOLTAREN) 1 % GEL Apply 4 g topically 4 (four) times daily as needed (for knee arthritis). (Patient not taking: Reported on 07/03/2014)    EXAM:  BP 116/60 mmHg  Temp(Src) 99 F (37.2 C) (Oral)  Ht 5\' 3"  (1.6 m)  Wt 142 lb 3.2 oz (64.501 kg)  BMI 25.20 kg/m2  Body mass index is 25.2 kg/(m^2).  GENERAL: vitals reviewed and listed above, alert, oriented, appears well hydrated and in no acute distress HEENT: atraumatic, conjunctiva  clear, no obvious abnormalities on inspection of external nose and ears OP : no lesion edema or exudate  NECK: no obvious masses on inspection palpation  LUNGS: clear to auscultation bilaterally, no wheezes, rales or rhonchi, good air movement CV: HRRR, no clubbing cyanosis or  peripheral edema nl cap refill  PSYCH: pleasant and cooperative, no obvious depression or anxiety Lab Results  Component Value Date   WBC 6.9 06/26/2014   HGB 14.3 06/26/2014   HCT 42.3 06/26/2014   PLT 288.0 06/26/2014   GLUCOSE 97 06/26/2014   CHOL 239* 06/26/2014   TRIG 111.0 06/26/2014  HDL 60.90 06/26/2014   LDLDIRECT 127.6 06/23/2013   LDLCALC 156* 06/26/2014   ALT 23 06/26/2014   AST 29 06/26/2014   NA 141 06/26/2014   K 4.7 06/26/2014   CL 103 06/26/2014   CREATININE 0.90 06/26/2014   BUN 21 06/26/2014   CO2 30 06/26/2014   TSH 3.88 06/26/2014    ASSESSMENT AND PLAN:  Discussed the following assessment and plan:  Essential hypertension - controlled continue yearly  Cough, persistent - better prob from acei    Cough due to ACE inhibitor  Other allergic rhinitis - mild add incs  -Patient advised to return or notify health care team  if symptoms worsen ,persist or new concerns arise.  Patient Instructions  Continue change BP meds .  Try nasal cortisone  nasacort and flonase every day  to control allergy sx .  Preventive visit when due in February  2017.        Standley Brooking. Panosh M.D.

## 2014-10-05 ENCOUNTER — Encounter: Payer: Self-pay | Admitting: Internal Medicine

## 2014-10-06 NOTE — Telephone Encounter (Signed)
bp was good at OV  But if your readings are up  We can change 100/12.5 hyzaar  Disp 90 refill x 1  Then let us know after about 6 weeks what bp readings are .

## 2014-10-07 MED ORDER — LOSARTAN POTASSIUM-HCTZ 100-12.5 MG PO TABS: 1.0000 | ORAL_TABLET | Freq: Every day | ORAL | Status: DC

## 2014-11-08 ENCOUNTER — Encounter: Payer: Self-pay | Admitting: Internal Medicine

## 2014-11-13 MED ORDER — LOSARTAN POTASSIUM-HCTZ 100-25 MG PO TABS: 1.0000 | ORAL_TABLET | Freq: Every day | ORAL | Status: DC

## 2014-11-13 NOTE — Telephone Encounter (Signed)
Increase to losartan / hctz to 100 /25 disp 90 refill x 1

## 2014-12-16 DIAGNOSIS — M1712 Unilateral primary osteoarthritis, left knee: Secondary | ICD-10-CM | POA: Diagnosis not present

## 2014-12-21 DIAGNOSIS — H2513 Age-related nuclear cataract, bilateral: Secondary | ICD-10-CM | POA: Diagnosis not present

## 2015-01-06 ENCOUNTER — Encounter: Payer: Self-pay | Admitting: Internal Medicine

## 2015-01-09 NOTE — Telephone Encounter (Signed)
Change to generic protonix qd and do gi referral  If acceptable to her

## 2015-01-12 ENCOUNTER — Encounter: Payer: Self-pay | Admitting: Internal Medicine

## 2015-01-12 ENCOUNTER — Other Ambulatory Visit: Payer: Self-pay | Admitting: Family Medicine

## 2015-01-12 DIAGNOSIS — K219 Gastro-esophageal reflux disease without esophagitis: Secondary | ICD-10-CM

## 2015-01-12 MED ORDER — PANTOPRAZOLE SODIUM 40 MG PO TBEC: 40.0000 mg | DELAYED_RELEASE_TABLET | Freq: Every day | ORAL | Status: DC

## 2015-01-12 NOTE — Telephone Encounter (Signed)
Sent to the pharmacy by e-scribe. 

## 2015-02-08 ENCOUNTER — Encounter: Payer: Self-pay | Admitting: Internal Medicine

## 2015-02-08 ENCOUNTER — Ambulatory Visit (INDEPENDENT_AMBULATORY_CARE_PROVIDER_SITE_OTHER): Payer: Medicare Other | Admitting: Internal Medicine

## 2015-02-08 VITALS — BP 142/68 | Temp 98.8°F | Ht 63.0 in | Wt 148.1 lb

## 2015-02-08 DIAGNOSIS — Z23 Encounter for immunization: Secondary | ICD-10-CM | POA: Diagnosis not present

## 2015-02-08 DIAGNOSIS — Z01818 Encounter for other preprocedural examination: Secondary | ICD-10-CM

## 2015-02-08 DIAGNOSIS — I10 Essential (primary) hypertension: Secondary | ICD-10-CM

## 2015-02-08 DIAGNOSIS — K219 Gastro-esophageal reflux disease without esophagitis: Secondary | ICD-10-CM

## 2015-02-08 DIAGNOSIS — M170 Bilateral primary osteoarthritis of knee: Secondary | ICD-10-CM | POA: Diagnosis not present

## 2015-02-08 MED ORDER — RANITIDINE HCL 150 MG PO TABS: 150.0000 mg | ORAL_TABLET | Freq: Two times a day (BID) | ORAL | Status: DC

## 2015-02-08 MED ORDER — CYCLOBENZAPRINE HCL 10 MG PO TABS: ORAL_TABLET | ORAL | Status: DC

## 2015-02-08 NOTE — Patient Instructions (Addendum)
We'll send a letter to your surgeon about medical clearance for your left total knee arthroplasty scheduled 04/14/2015.   Continue blood pressure medication. Blood pressure is acceptable for surgery. Expect  BP to further  improve after surgery when you're able to add lifestyle intervention Trial of ranitidine 150 mg twice a day instead of the proton next. Avoid toms calcium acid blockers. Sometimes they cause rebound acid. Can try other in acids if needed. Continue the best lifestyle intervention. Caution with Flexeril causing mental fogginess but acceptable to use low-dose at night if helps sleep until pain is better.

## 2015-02-08 NOTE — Progress Notes (Signed)
Pre visit review using our clinic review tool, if applicable. No additional management support is needed unless otherwise documented below in the visit note.  Chief Complaint  Patient presents with  . Medical Clearance    left tka  med lat  dr Synthia Innocent    HPI:  Jacqueline Myers  64 y.o. comes in for pre operative evaluation for total knee replacement left knee Dr. Wynelle Link for 04/14/2015. Her knee pain bilaterally is getting worse and she is unable to exercise and states that his been difficult and she has gained weight because of this. Is not taking anything for pain because it doesn't work. We'll take a half of the Flexeril at night that helps her sleep through her pain.  She has had reflux symptoms cough better has an appointment with GI at the end of the month. Did not get the Protonix filled because reading the literature it stated that she couldn't go off the medicine. In the meantime she is taking toms as needed.  Her blood pressure readings she comes in with a log 1:30 to 140s and occasional 150. Diastolics are in the 73U 20U. She is on losartan HCTZ 100/25. Thinks that her blood pressure would be better if she could go back to exercise and lose a few pounds.  She has no history of excessive bleeding cardiovascular chest pain new shortness of breath or neurologic findings or symptoms. ROS: See pertinent positives and negatives per HPI. No unusual bleeding cough new shortness of breath syncope acute neurologic symptoms.  Past Medical History  Diagnosis Date  . Hypertension   . Hyperlipidemia   . Spinal stenosis   . Osteoarthritis     Family History  Problem Relation Age of Onset  . Alcohol abuse Mother   . Stroke Mother   . Hypertension Mother   . Diabetes Mother   . Hyperlipidemia Father   . Breast cancer Sister   . Colon cancer Neg Hx     Social History   Social History  . Marital Status: Married    Spouse Name: N/A  . Number of Children: N/A  . Years of  Education: N/A   Social History Main Topics  . Smoking status: Former Research scientist (life sciences)  . Smokeless tobacco: Never Used  . Alcohol Use: No  . Drug Use: No  . Sexual Activity: Not Asked   Other Topics Concern  . None   Social History Narrative   Married   HH of 2 helps take care 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   Neg td neg ets    Outpatient Prescriptions Prior to Visit  Medication Sig Dispense Refill  . calcium citrate-vitamin D (CITRACAL+D) 315-200 MG-UNIT per tablet Take 1 tablet by mouth 2 (two) times daily.    . fish oil-omega-3 fatty acids 1000 MG capsule Take 4 g by mouth daily.     Marland Kitchen losartan-hydrochlorothiazide (HYZAAR) 100-25 MG per tablet Take 1 tablet by mouth daily. 90 tablet 1  . Multiple Vitamins-Minerals (CENTRUM SILVER ADULT 50+ PO) Take by mouth.    . cyclobenzaprine (FLEXERIL) 10 MG tablet TAKE ONE-HALF TO ONE TABLET BY MOUTH THREE TIMES DAILY AS NEEDED FOR MUSCLE SPASM. 90 tablet 0  . diclofenac sodium (VOLTAREN) 1 % GEL Apply 4 g topically 4 (four) times daily as needed (for knee arthritis). (Patient not taking: Reported on 07/03/2014) 100 g  6  . pantoprazole (PROTONIX) 40 MG tablet Take 1 tablet (40 mg total) by mouth daily. (Patient not taking: Reported on 02/08/2015) 30 tablet 3   No facility-administered medications prior to visit.     EXAM:  BP 142/68 mmHg  Temp(Src) 98.8 F (37.1 C) (Oral)  Ht 5\' 3"  (1.6 m)  Wt 148 lb 1.6 oz (67.178 kg)  BMI 26.24 kg/m2  Body mass index is 26.24 kg/(m^2).  GENERAL: vitals reviewed and listed above, alert, oriented, appears well hydrated and in no acute distress HEENT: atraumatic, conjunctiva  clear, no obvious abnormalities on inspection of external nose and ears OP : no lesion edema or exudate  NECK: no obvious masses on inspection palpation  LUNGS: clear to auscultation bilaterally, no  wheezes, rales or rhonchi, good air movement CV: HRRR, no clubbing cyanosis or  peripheral edema nl cap refill abdomen soft without ago to megaly guarding or rebound. MS: moves all extremities without noticeable focal  abnormality walks with a slight limp no acute effusion. PSYCH: pleasant and cooperative, no obvious depression or anxiety Lab Results  Component Value Date   WBC 6.9 06/26/2014   HGB 14.3 06/26/2014   HCT 42.3 06/26/2014   PLT 288.0 06/26/2014   GLUCOSE 97 06/26/2014   CHOL 239* 06/26/2014   TRIG 111.0 06/26/2014   HDL 60.90 06/26/2014   LDLDIRECT 127.6 06/23/2013   LDLCALC 156* 06/26/2014   ALT 23 06/26/2014   AST 29 06/26/2014   NA 141 06/26/2014   K 4.7 06/26/2014   CL 103 06/26/2014   CREATININE 0.90 06/26/2014   BUN 21 06/26/2014   CO2 30 06/26/2014   TSH 3.88 06/26/2014   BP Readings from Last 3 Encounters:  02/08/15 142/68  09/02/14 116/60  07/03/14 152/70   Wt Readings from Last 3 Encounters:  02/08/15 148 lb 1.6 oz (67.178 kg)  09/02/14 142 lb 3.2 oz (64.501 kg)  07/03/14 140 lb 9.6 oz (63.776 kg)    Blood pressure logs reviewed.  ASSESSMENT AND PLAN:  Discussed the following assessment and plan:  Primary osteoarthritis of both knees  Pre-operative clearance  Essential hypertension  Gastroesophageal reflux disease, esophagitis presence not specified - sx prob related to weight gain  see text .  Need for prophylactic vaccination and inoculation against influenza - Plan: Flu vaccine HIGH DOSE PF (Fluzone High dose) No high risk contraindications. Agree with proceeding with elective surgery of joint replacement.  Continue blood pressure monitoring and medication. Expectant management. Caution with Flexeril.  Keep appointment with GI. -Patient advised to return or notify health care team  if symptoms worsen ,persist or new concerns arise.  Patient Instructions  We'll send a letter to your surgeon about medical clearance for your left total  knee arthroplasty scheduled 04/14/2015.   Continue blood pressure medication. Blood pressure is acceptable for surgery. Expect  BP to further  improve after surgery when you're able to add lifestyle intervention Trial of ranitidine 150 mg twice a day instead of the proton next. Avoid toms calcium acid blockers. Sometimes they cause rebound acid. Can try other in acids if needed. Continue the best lifestyle intervention. Caution with Flexeril causing mental fogginess but acceptable to use low-dose at night if helps sleep until pain is better.  Standley Brooking. Marcelus Dubberly M.D.

## 2015-03-08 ENCOUNTER — Encounter: Payer: Self-pay | Admitting: Internal Medicine

## 2015-03-08 ENCOUNTER — Ambulatory Visit (INDEPENDENT_AMBULATORY_CARE_PROVIDER_SITE_OTHER): Payer: Medicare Other | Admitting: Internal Medicine

## 2015-03-08 VITALS — BP 130/74 | HR 76 | Ht 63.0 in | Wt 146.0 lb

## 2015-03-08 DIAGNOSIS — R1310 Dysphagia, unspecified: Secondary | ICD-10-CM | POA: Diagnosis not present

## 2015-03-08 DIAGNOSIS — K219 Gastro-esophageal reflux disease without esophagitis: Secondary | ICD-10-CM | POA: Diagnosis not present

## 2015-03-08 NOTE — Patient Instructions (Signed)
You have been scheduled for an endoscopy. Please follow written instructions given to you at your visit today. If you use inhalers (even only as needed), please bring them with you on the day of your procedure.   

## 2015-03-08 NOTE — Progress Notes (Signed)
HISTORY OF PRESENT ILLNESS:  Jacqueline Myers is a 64 y.o. female with past medical history as listed below who is referred by her primary care provider, Dr. Regis Bill, regarding acid reflux and associated symptoms. Patient reports developing reflux symptoms in January 2016 as manifested by regurgitation and pyrosis. Subsequently developed problems with hoarseness and cough. She has had long-term intermittent solid food dysphagia. Over the past year she has gained approximate 15-20 pounds. She did undergo pulmonary workup because of a history of smoking including chest x-ray and lung evaluation which, by her report, were "okay". Her symptoms are felt possibly due to reflux and she was placed on ranitidine 150 mg twice a day. Problems with burning and regurgitation improved. Hoarseness has persisted. For her cough lisinopril was discontinued with associated resolution of cough. GI review of systems is otherwise negative. She did undergo her last colonoscopy 01/16/2013. This revealed hyperplastic polyps only. Follow-up in 10 years recommended  REVIEW OF SYSTEMS:  All non-GI ROS negative except for arthritis  Past Medical History  Diagnosis Date  . Hypertension   . Hyperlipidemia   . Spinal stenosis   . Osteoarthritis   . GERD (gastroesophageal reflux disease)   . Diverticulosis   . Colon polyps     hyperplastic    Past Surgical History  Procedure Laterality Date  . Nm myoview ltd  08/2006    normal for abn ekg  . Tonsillectomy      Social History Jacqueline Myers  reports that she has quit smoking. She has never used smokeless tobacco. She reports that she does not drink alcohol or use illicit drugs.  family history includes Alcohol abuse in her mother; Breast cancer in her sister; Diabetes in her mother; Hyperlipidemia in her father; Hypertension in her mother; Stroke in her mother. There is no history of Colon cancer.  Allergies  Allergen Reactions  . Simvastatin     REACTION:  muscle aches  . Lisinopril Cough    Ace cough better on losartan       PHYSICAL EXAMINATION: Vital signs: BP 130/74 mmHg  Pulse 76  Ht 5\' 3"  (1.6 m)  Wt 146 lb (66.225 kg)  BMI 25.87 kg/m2  Constitutional: generally well-appearing, no acute distress Psychiatric: alert and oriented x3, cooperative Eyes: extraocular movements intact, anicteric, conjunctiva pink Mouth: oral pharynx moist, no lesions Neck: supple no lymphadenopathy Cardiovascular: heart regular rate and rhythm, no murmur Lungs: clear to auscultation bilaterally Abdomen: soft, nontender, nondistended, no obvious ascites, no peritoneal signs, normal bowel sounds, no organomegaly Rectal: Omitted Extremities: no clubbing cyanosis or lower extremity edema bilaterally Skin: no lesions on visible extremities Neuro: No focal deficits.   ASSESSMENT:  #1. GERD. Significant improvement on ranitidine 150 mg twice daily #2. Intermittent solid food dysphagia. Ongoing. Rule out peptic stricture #3. Hoarseness. May be related to GERD. If persistent, would recommend ENT evaluation which can be arranged through her PCP #4. Non-adenomatous colon polyp September 2014  PLAN:  #1. Reflux precautions with attention to weight loss. Reviewed and discussed #2. Continue ranitidine 150 mg by mouth twice a day #3. Schedule upper endoscopy with possible esophageal dilation.The nature of the procedure, as well as the risks, benefits, and alternatives were carefully and thoroughly reviewed with the patient. Ample time for discussion and questions allowed. The patient understood, was satisfied, and agreed to proceed. #4. Consider PPI #5. Consider ENT evaluation (per PCP as discussed above) #6. Routine follow-up colonoscopy around 2024  A copy of his consultation note has been  sent to Dr. Regis Bill

## 2015-03-09 ENCOUNTER — Ambulatory Visit (AMBULATORY_SURGERY_CENTER): Payer: Medicare Other | Admitting: Internal Medicine

## 2015-03-09 ENCOUNTER — Encounter: Payer: Self-pay | Admitting: Internal Medicine

## 2015-03-09 VITALS — BP 117/65 | HR 74 | Temp 97.4°F | Resp 27 | Ht 63.0 in | Wt 146.0 lb

## 2015-03-09 DIAGNOSIS — K219 Gastro-esophageal reflux disease without esophagitis: Secondary | ICD-10-CM

## 2015-03-09 DIAGNOSIS — R1310 Dysphagia, unspecified: Secondary | ICD-10-CM

## 2015-03-09 DIAGNOSIS — K222 Esophageal obstruction: Secondary | ICD-10-CM | POA: Diagnosis not present

## 2015-03-09 DIAGNOSIS — K2100 Gastro-esophageal reflux disease with esophagitis, without bleeding: Secondary | ICD-10-CM

## 2015-03-09 DIAGNOSIS — I10 Essential (primary) hypertension: Secondary | ICD-10-CM | POA: Diagnosis not present

## 2015-03-09 MED ORDER — OMEPRAZOLE 20 MG PO CPDR: 20.0000 mg | DELAYED_RELEASE_CAPSULE | Freq: Every day | ORAL | Status: DC

## 2015-03-09 MED ORDER — SODIUM CHLORIDE 0.9 % IV SOLN: 500.0000 mL | INTRAVENOUS | Status: DC

## 2015-03-09 NOTE — Progress Notes (Signed)
Called to room to assist during endoscopic procedure.  Patient ID and intended procedure confirmed with present staff. Received instructions for my participation in the procedure from the performing physician.  

## 2015-03-09 NOTE — Patient Instructions (Signed)
Discharge instructions given. Handout on a dilatation diet. Resume previous medications. Prescription sent into pharmacy. YOU HAD AN ENDOSCOPIC PROCEDURE TODAY AT [facility]:   Refer to the procedure report that was given to you for any specific questions about what was found during the examination.  If the procedure report does not answer your questions, please call your gastroenterologist to clarify.  If you requested that your care partner not be given the details of your procedure findings, then the procedure report has been included in a sealed envelope for you to review at your convenience later.  YOU SHOULD EXPECT: Some feelings of bloating in the abdomen. Passage of more gas than usual.  Walking can help get rid of the air that was put into your GI tract during the procedure and reduce the bloating. If you had a lower endoscopy (such as a colonoscopy or flexible sigmoidoscopy) you may notice spotting of blood in your stool or on the toilet paper. If you underwent a bowel prep for your procedure, you may not have a normal bowel movement for a few days.  Please Note:  You might notice some irritation and congestion in your nose or some drainage.  This is from the oxygen used during your procedure.  There is no need for concern and it should clear up in a day or so.  SYMPTOMS TO REPORT IMMEDIATELY:    Following upper endoscopy (EGD)  Vomiting of blood or coffee ground material  New chest pain or pain under the shoulder blades  Painful or persistently difficult swallowing  New shortness of breath  Fever of 100F or higher  Black, tarry-looking stools  For urgent or emergent issues, a gastroenterologist can be reached at any hour by calling [phone].   DIET: Your first meal following the procedure should be a small meal and then it is ok to progress to your normal diet. Heavy or fried foods are harder to digest and may make you feel nauseous or bloated.  Likewise,  meals heavy in dairy and vegetables can increase bloating.  Drink plenty of fluids but you should avoid alcoholic beverages for 24 hours.  ACTIVITY:  You should plan to take it easy for the rest of today and you should NOT DRIVE or use heavy machinery until tomorrow (because of the sedation medicines used during the test).    FOLLOW UP: Our staff will call the number listed on your records the next business day following your procedure to check on you and address any questions or concerns that you may have regarding the information given to you following your procedure. If we do not reach you, we will leave a message.  However, if you are feeling well and you are not experiencing any problems, there is no need to return our call.  We will assume that you have returned to your regular daily activities without incident.  If any biopsies were taken you will be contacted by phone or by letter within the next 1-3 weeks.  Please call us at [phone] if you have not heard about the biopsies in 3 weeks.    SIGNATURES/CONFIDENTIALITY: You and/or your care partner have signed paperwork which will be entered into your electronic medical record.  These signatures attest to the fact that that the information above on your After Visit Summary has been reviewed and is understood.  Full responsibility of the confidentiality of this discharge information lies with you and/or your care-partner.

## 2015-03-09 NOTE — Progress Notes (Signed)
Transferred to recovery room. A/O x3, pleased with MAC.  VSS.  Report to Celia, RN. 

## 2015-03-09 NOTE — Op Note (Signed)
[place]  [address] [place], [id number]   ENDOSCOPY PROCEDURE REPORT  PATIENT: Jacqueline Myers, Jacqueline Myers  MR#: [id number] BIRTHDATE: Sep 25, 1950 , 78  yrs. old GENDER: female ENDOSCOPIST: Eustace Quail, MD REFERRED BY:  Burnis Medin, M.D. PROCEDURE DATE:  03/09/2015 PROCEDURE:  EGD, diagnostic and Maloney dilation of esophagus   -59 French ASA CLASS:     Class II INDICATIONS:  dysphagia and history of esophageal reflux. MEDICATIONS: Monitored anesthesia care and Propofol 160 mg IV TOPICAL ANESTHETIC: none  DESCRIPTION OF PROCEDURE: After the risks benefits and alternatives of the procedure were thoroughly explained, informed consent was obtained.  The LB [id number] endoscope was introduced through the mouth and advanced to the second portion of the duodenum , Without limitations.  The instrument was slowly withdrawn as the mucosa was fully examined.   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  The stomach was normal.  The duodenum was normal.  Retroflexed views revealed a hiatal hernia.     The scope was then withdrawn from the patient and the procedure completed. THERAPY: 54 French Maloney dilator was passed into the esophagus without resistance or heme. Tolerated well  COMPLICATIONS: There were no immediate complications.  ENDOSCOPIC IMPRESSION: 1. GERD with endoscopic evidence of esophagitis and stricture status post dilation  RECOMMENDATIONS: 1.  Clear liquids until 10:30 AM, then soft foods rest of day. Resume prior diet tomorrow. 2.  Prescribe omeprazole 20 mg daily; #30; 11 refills.      Stop ranitidine for now 3.  Call office next 2-3 days to schedule an office appointment with Dr. Henrene Pastor in 2-3 months. Make sure that the doctors continue your omeprazole when you have knee surgery  REPEAT EXAM:  eSigned:  Eustace Quail, MD  03/09/2015 8:41 AM    CC:The Patient and Burnis Medin, MD

## 2015-03-10 ENCOUNTER — Telehealth: Payer: Self-pay | Admitting: *Deleted

## 2015-03-10 NOTE — Telephone Encounter (Signed)
  Follow up Call-  Call back number 03/09/2015 01/16/2013  Post procedure Call Back phone  # [phone]  Permission to leave phone message Yes Yes     Patient questions:  Do you have a fever, pain , or abdominal swelling? No. Pain Score  0 *  Have you tolerated food without any problems? Yes.    Have you been able to return to your normal activities? Yes.    Do you have any questions about your discharge instructions: Diet   No. Medications  No. Follow up visit  No.  Do you have questions or concerns about your Care? No.  Actions: * If pain score is 4 or above: No action needed, pain <4.

## 2015-03-27 ENCOUNTER — Ambulatory Visit: Payer: Self-pay | Admitting: Orthopedic Surgery

## 2015-03-27 NOTE — Progress Notes (Signed)
Preoperative surgical orders have been place into the Epic hospital system for Carleene Cooper on 03/27/2015, 4:22 PM  by Mickel Crow for surgery on 04-14-2015.  Preop Total Knee orders including Experal, IV Tylenol, and IV Decadron as long as there are no contraindications to the above medications. Arlee Muslim, PA-C

## 2015-04-05 ENCOUNTER — Other Ambulatory Visit (HOSPITAL_COMMUNITY): Payer: Self-pay | Admitting: *Deleted

## 2015-04-05 NOTE — Progress Notes (Signed)
Chest xray 06-09-14 epic medical clearcne dr Regis Bill on chart

## 2015-04-05 NOTE — Patient Instructions (Addendum)
Jacqueline Myers  04/05/2015   Your procedure is scheduled on: Wednesday April 14, 2015   Report to [facility]  Entrance take [place]  elevators to 3rd floor to  [place] at 1030 AM.  Call this number if you have problems the morning of surgery [phone]   Remember: ONLY 1 PERSON MAY GO WITH YOU TO SHORT STAY TO GET  READY MORNING OF [place].  Do not eat food or drink liquids :After Midnight.     Take these medicines the morning of surgery with A SIP OF WATER: Omeprazole (Prilosec); May use eye drops if needed                              You may not have any metal on your body including hair pins and              piercings  Do not wear jewelry, make-up, lotions, powders or perfumes, deodorant             Do not wear nail polish.  Do not shave  48 hours prior to surgery.               Do not bring valuables to the hospital. [place].  Contacts, dentures or bridgework may not be worn into surgery.  Leave suitcase in the car. After surgery it may be brought to your room.                Please read over the following fact sheets you were given:MRSA INFORMATION SHEET; INCENTIVE SPIROMETER; BLOOD TRANSFUSION INFORMATION SHEET _____________________________________________________________________             [facility] - Preparing for Surgery Before surgery, you can play an important role.  Because skin is not sterile, your skin needs to be as free of germs as possible.  You can reduce the number of germs on your skin by washing with CHG (chlorahexidine gluconate) soap before surgery.  CHG is an antiseptic cleaner which kills germs and bonds with the skin to continue killing germs even after washing. Please DO NOT use if you have an allergy to CHG or antibacterial soaps.  If your skin becomes reddened/irritated stop using the CHG and inform your nurse when you arrive at Short Stay. Do not  shave (including legs and underarms) for at least 48 hours prior to the first CHG shower.  You may shave your face/neck. Please follow these instructions carefully:  1.  Shower with CHG Soap the night before surgery and the  morning of Surgery.  2.  If you choose to wash your hair, wash your hair first as usual with your  normal  shampoo.  3.  After you shampoo, rinse your hair and body thoroughly to remove the  shampoo.                           4.  Use CHG as you would any other liquid soap.  You can apply chg directly  to the skin and wash                       Gently with a scrungie or clean  washcloth.  5.  Apply the CHG Soap to your body ONLY FROM THE NECK DOWN.   Do not use on face/ open                           Wound or open sores. Avoid contact with eyes, ears mouth and genitals (private parts).                       Wash face,  Genitals (private parts) with your normal soap.             6.  Wash thoroughly, paying special attention to the area where your surgery  will be performed.  7.  Thoroughly rinse your body with warm water from the neck down.  8.  DO NOT shower/wash with your normal soap after using and rinsing off  the CHG Soap.                9.  Pat yourself dry with a clean towel.            10.  Wear clean pajamas.            11.  Place clean sheets on your bed the night of your first shower and do not  sleep with pets. Day of Surgery : Do not apply any lotions/deodorants the morning of surgery.  Please wear clean clothes to the hospital/surgery center.  FAILURE TO FOLLOW THESE INSTRUCTIONS MAY RESULT IN THE CANCELLATION OF YOUR SURGERY PATIENT SIGNATURE_________________________________  NURSE SIGNATURE__________________________________  ________________________________________________________________________   Adam Phenix  An incentive spirometer is a tool that can help keep your lungs clear and active. This tool measures how well you are filling your lungs  with each breath. Taking long deep breaths may help reverse or decrease the chance of developing breathing (pulmonary) problems (especially infection) following:  A long period of time when you are unable to move or be active. BEFORE THE PROCEDURE   If the spirometer includes an indicator to show your best effort, your nurse or respiratory therapist will set it to a desired goal.  If possible, sit up straight or lean slightly forward. Try not to slouch.  Hold the incentive spirometer in an upright position. INSTRUCTIONS FOR USE   Sit on the edge of your bed if possible, or sit up as far as you can in bed or on a chair.  Hold the incentive spirometer in an upright position.  Breathe out normally.  Place the mouthpiece in your mouth and seal your lips tightly around it.  Breathe in slowly and as deeply as possible, raising the piston or the ball toward the top of the column.  Hold your breath for 3-5 seconds or for as long as possible. Allow the piston or ball to fall to the bottom of the column.  Remove the mouthpiece from your mouth and breathe out normally.  Rest for a few seconds and repeat Steps 1 through 7 at least 10 times every 1-2 hours when you are awake. Take your time and take a few normal breaths between deep breaths.  The spirometer may include an indicator to show your best effort. Use the indicator as a goal to work toward during each repetition.  After each set of 10 deep breaths, practice coughing to be sure your lungs are clear. If you have an incision (the cut made at the time of surgery), support your incision when coughing by  placing a pillow or rolled up towels firmly against it. Once you are able to get out of bed, walk around indoors and cough well. You may stop using the incentive spirometer when instructed by your caregiver.  RISKS AND COMPLICATIONS  Take your time so you do not get dizzy or light-headed.  If you are in pain, you may need to take or ask  for pain medication before doing incentive spirometry. It is harder to take a deep breath if you are having pain. AFTER USE  Rest and breathe slowly and easily.  It can be helpful to keep track of a log of your progress. Your caregiver can provide you with a simple table to help with this. If you are using the spirometer at home, follow these instructions: Jacqueline Myers IF:   You are having difficultly using the spirometer.  You have trouble using the spirometer as often as instructed.  Your pain medication is not giving enough relief while using the spirometer.  You develop fever of 100.5 F (38.1 C) or higher. SEEK IMMEDIATE MEDICAL CARE IF:   You cough up bloody sputum that had not been present before.  You develop fever of 102 F (38.9 C) or greater.  You develop worsening pain at or near the incision site. MAKE SURE YOU:   Understand these instructions.  Will watch your condition.  Will get help right away if you are not doing well or get worse. Document Released: 09/04/2006 Document Revised: 07/17/2011 Document Reviewed: 11/05/2006 ExitCare Patient Information 2014 ExitCare, [place].   ________________________________________________________________________  WHAT IS A BLOOD TRANSFUSION? Blood Transfusion Information  A transfusion is the replacement of blood or some of its parts. Blood is made up of multiple cells which provide different functions.  Red blood cells carry oxygen and are used for blood loss replacement.  White blood cells fight against infection.  Platelets control bleeding.  Plasma helps clot blood.  Other blood products are available for specialized needs, such as hemophilia or other clotting disorders. BEFORE THE TRANSFUSION  Who gives blood for transfusions?   Healthy volunteers who are fully evaluated to make sure their blood is safe. This is blood bank blood. Transfusion therapy is the safest it has ever been in the practice of  medicine. Before blood is taken from a donor, a complete history is taken to make sure that person has no history of diseases nor engages in risky social behavior (examples are intravenous drug use or sexual activity with multiple partners). The donor's travel history is screened to minimize risk of transmitting infections, such as malaria. The donated blood is tested for signs of infectious diseases, such as HIV and hepatitis. The blood is then tested to be sure it is compatible with you in order to minimize the chance of a transfusion reaction. If you or a relative donates blood, this is often done in anticipation of surgery and is not appropriate for emergency situations. It takes many days to process the donated blood. RISKS AND COMPLICATIONS Although transfusion therapy is very safe and saves many lives, the main dangers of transfusion include:   Getting an infectious disease.  Developing a transfusion reaction. This is an allergic reaction to something in the blood you were given. Every precaution is taken to prevent this. The decision to have a blood transfusion has been considered carefully by your caregiver before blood is given. Blood is not given unless the benefits outweigh the risks. AFTER THE TRANSFUSION  Right after receiving a blood  transfusion, you will usually feel much better and more energetic. This is especially true if your red blood cells have gotten low (anemic). The transfusion raises the level of the red blood cells which carry oxygen, and this usually causes an energy increase.  The nurse administering the transfusion will monitor you carefully for complications. HOME CARE INSTRUCTIONS  No special instructions are needed after a transfusion. You may find your energy is better. Speak with your caregiver about any limitations on activity for underlying diseases you may have. SEEK MEDICAL CARE IF:   Your condition is not improving after your transfusion.  You develop  redness or irritation at the intravenous (IV) site. SEEK IMMEDIATE MEDICAL CARE IF:  Any of the following symptoms occur over the next 12 hours:  Shaking chills.  You have a temperature by mouth above 102 F (38.9 C), not controlled by medicine.  Chest, back, or muscle pain.  People around you feel you are not acting correctly or are confused.  Shortness of breath or difficulty breathing.  Dizziness and fainting.  You get a rash or develop hives.  You have a decrease in urine output.  Your urine turns a dark color or changes to pink, red, or brown. Any of the following symptoms occur over the next 10 days:  You have a temperature by mouth above 102 F (38.9 C), not controlled by medicine.  Shortness of breath.  Weakness after normal activity.  The white part of the eye turns yellow (jaundice).  You have a decrease in the amount of urine or are urinating less often.  Your urine turns a dark color or changes to pink, red, or brown. Document Released: 04/21/2000 Document Revised: 07/17/2011 Document Reviewed: 12/09/2007 [facility] Patient Information 2014 [place], [place].  _______________________________________________________________________

## 2015-04-08 ENCOUNTER — Encounter (HOSPITAL_COMMUNITY): Payer: Self-pay

## 2015-04-08 ENCOUNTER — Encounter (HOSPITAL_COMMUNITY)
Admission: RE | Admit: 2015-04-08 | Discharge: 2015-04-08 | Disposition: A | Discharge status: Home / Self Care | Payer: Medicare Other | Source: Ambulatory Visit | Attending: Orthopedic Surgery | Admitting: Orthopedic Surgery

## 2015-04-08 DIAGNOSIS — M179 Osteoarthritis of knee, unspecified: Secondary | ICD-10-CM | POA: Diagnosis not present

## 2015-04-08 DIAGNOSIS — Z01818 Encounter for other preprocedural examination: Secondary | ICD-10-CM | POA: Diagnosis not present

## 2015-04-08 HISTORY — DX: Nonrheumatic mitral (valve) prolapse: I34.1

## 2015-04-08 HISTORY — DX: Age-related osteoporosis without current pathological fracture: M81.0

## 2015-04-08 HISTORY — DX: Personal history of other diseases of the respiratory system: Z87.09

## 2015-04-08 LAB — SURGICAL PCR SCREEN
MRSA, PCR: NEGATIVE
Staphylococcus aureus: NEGATIVE

## 2015-04-08 LAB — URINALYSIS, ROUTINE W REFLEX MICROSCOPIC
BILIRUBIN URINE: NEGATIVE
GLUCOSE, UA: NEGATIVE mg/dL
HGB URINE DIPSTICK: NEGATIVE
Ketones, ur: NEGATIVE mg/dL
Leukocytes, UA: NEGATIVE
Nitrite: NEGATIVE
PH: 6 (ref 5.0–8.0)
Protein, ur: NEGATIVE mg/dL
SPECIFIC GRAVITY, URINE: 1.024 (ref 1.005–1.030)

## 2015-04-08 LAB — COMPREHENSIVE METABOLIC PANEL
ALT: 22 U/L (ref 14–54)
ANION GAP: 8 (ref 5–15)
AST: 29 U/L (ref 15–41)
Albumin: 4.5 g/dL (ref 3.5–5.0)
Alkaline Phosphatase: 75 U/L (ref 38–126)
BILIRUBIN TOTAL: 0.6 mg/dL (ref 0.3–1.2)
BUN: 19 mg/dL (ref 6–20)
CHLORIDE: 102 mmol/L (ref 101–111)
CO2: 29 mmol/L (ref 22–32)
Calcium: 9.6 mg/dL (ref 8.9–10.3)
Creatinine, Ser: 0.92 mg/dL (ref 0.44–1.00)
GFR calc Af Amer: >60 mL/min (ref >60)
Glucose, Bld: 103 mg/dL — ABNORMAL HIGH (ref 65–99)
POTASSIUM: 4.6 mmol/L (ref 3.5–5.1)
Sodium: 139 mmol/L (ref 135–145)
TOTAL PROTEIN: 7.5 g/dL (ref 6.5–8.1)

## 2015-04-08 LAB — CBC
HEMATOCRIT: 42.9 % (ref 36.0–46.0)
Hemoglobin: 14 g/dL (ref 12.0–15.0)
MCH: 29.1 pg (ref 26.0–34.0)
MCHC: 32.6 g/dL (ref 30.0–36.0)
MCV: 89.2 fL (ref 78.0–100.0)
PLATELETS: 299 10*3/uL (ref 150–400)
RBC: 4.81 MIL/uL (ref 3.87–5.11)
RDW: 13.7 % (ref 11.5–15.5)
WBC: 10.2 10*3/uL (ref 4.0–10.5)

## 2015-04-08 LAB — PROTIME-INR
INR: 0.93 (ref 0.00–1.49)
PROTHROMBIN TIME: 12.7 s (ref 11.6–15.2)

## 2015-04-08 LAB — APTT: APTT: 28 s (ref 24–37)

## 2015-04-08 LAB — ABO/RH: ABO/RH(D): O NEG

## 2015-04-09 NOTE — Progress Notes (Signed)
Dr Orene Desanctis / anesthesia reviewed pts EKG per 04/08/2015 and 10/20/2013 / epic. No orders given. Anesthesia to see pt day of surgery.

## 2015-04-12 ENCOUNTER — Ambulatory Visit: Payer: Self-pay | Admitting: Orthopedic Surgery

## 2015-04-12 NOTE — H&P (Signed)
Jacqueline Myers DOB: [birth year] Married / Language: English / Race: White Female Date of Admission:  04/14/2015 CC:  Left Knee Pain History of Present Illness The patient is a 64 year old female who comes in for a preoperative History and Physical. The patient is scheduled for a left total knee arthroplasty to be performed by Dr. Dione Plover. Aluisio, MD at [facility] on 04-14-2015. The patient was seen for a second opinion. The patient reports left knee and right knee symptoms including: pain (medial. left knee hurts the worse.), swelling, locking, catching, giving way, stiffness and grinding which began 10 year(s) ago. The patient describes their pain as aching.The patient feels that the symptoms are worsening. The patient has the current diagnosis of knee osteoarthritis. Past treatment for this problem has included intra-articular injection of corticosteroids (visco did not help.). Note for "Knee pain": bilateral visco injection 2015. Pt. had been applying voltaren gel. She has been treated in the past by Dr. Almedia Balls. She has had cortisone injections, which in the past had helped for short amount of time. She had viscosupplement injections, which did not provide much benefit. She states that the left knee is more symptomatic than the right. Both knees are painful, but the left is worse. It is limiting what she can and cannot do. She does get pain with all activities and now starting to get pain at rest and at night. She is at a stage now where she cannot tolerate this anymore and is having a negative effect on her lifestyle. She is not having any associated hip pain, back pain, lower extremity weakness or paresthesia. She is ready to proceed with surgery for the knee at this time. They have been treated conservatively in the past for the above stated problem and despite conservative measures, they continue to have progressive pain and severe functional limitations and dysfunction. They have  failed non-operative management including home exercise, medications, and injections. It is felt that they would benefit from undergoing total joint replacement. Risks and benefits of the procedure have been discussed with the patient and they elect to proceed with surgery. There are no active contraindications to surgery such as ongoing infection or rapidly progressive neurological disease.  Problem List/Past Medical  Primary osteoarthritis of left knee (M17.12)  Arthritis of knee (M19.90)  left Hypercholesterolemia  Osteoarthritis  Osteoporosis  Gastroesophageal Reflux Disease  High blood pressure   Allergies (Theotis Gerdeman L Tannon Peerson, III PA-C; 03/18/2015 3:55 PM) No Known Drug Allergies 03/18/2015 (Marked as Inactive) Statins Support *DIETARY PRODUCTS/DIETARY MANAGEMENT PRODUCTS*  leg cramps and pain  Family History Congestive Heart Failure  mother Depression  father Diabetes Mellitus  father Cancer  sister Hypertension  father Osteoarthritis  mother and father  Social History  Living situation  live with spouse Marital status  married No history of drug/alcohol rehab  Exercise  Exercises weekly; does running / walking Children  2 Current drinker  12/16/2014: Currently drinks wine only occasionally per week Current work status  retired Tobacco use  12/16/2014: former smoker smoke(d) 1 pack(s) per day Not under pain contract  Number of flights of stairs before winded  2-3 Tobacco / smoke exposure  12/16/2014: no Post-Surgical Plans  Plan for home following Left Total Knee Replacement  Medication History Losartan Potassium-HCTZ (100-25MG  Tablet, Oral) Active. Cyclobenzaprine HCl (10MG  Tablet, Oral) Active. Citracal +D3 (250-107-500MG -MG-UNIT Tablet Chewable, Oral) Active. Omega 3 500 (500MG  Capsule, Oral) Active. Glucosamine 1500 Complex (Oral) Active. Multivitamin Women 50+ (Oral) Active.   Past  Surgical History  Colon Polyp Removal -  Colonoscopy  Tonsillectomy    Review of Systems General Not Present- Chills, Fatigue, Fever, Memory Loss, Night Sweats, Weight Gain and Weight Loss. Skin Not Present- Eczema, Hives, Itching, Lesions and Rash. HEENT Not Present- Dentures, Double Vision, Headache, Hearing Loss, Tinnitus and Visual Loss. Respiratory Not Present- Allergies, Chronic Cough, Coughing up blood, Shortness of breath at rest and Shortness of breath with exertion. Cardiovascular Not Present- Chest Pain, Difficulty Breathing Lying Down, Murmur, Palpitations, Racing/skipping heartbeats and Swelling. Gastrointestinal Present- Heartburn. Not Present- Abdominal Pain, Bloody Stool, Constipation, Diarrhea, Difficulty Swallowing, Jaundice, Loss of appetitie, Nausea and Vomiting. Female Genitourinary Not Present- Blood in Urine, Discharge, Flank Pain, Incontinence, Painful Urination, Urgency, Urinary frequency, Urinary Retention, Urinating at Night and Weak urinary stream. Musculoskeletal Present- Joint Pain and Morning Stiffness. Not Present- Back Pain, Joint Swelling, Muscle Pain, Muscle Weakness and Spasms. Neurological Not Present- Blackout spells, Difficulty with balance, Dizziness, Paralysis, Tremor and Weakness. Psychiatric Not Present- Insomnia.  Vitals  Weight: 143 lb Height: 63in Weight was reported by patient. Height was reported by patient. Body Surface Area: 1.68 m Body Mass Index: 25.33 kg/m  BP: 150/70 (Sitting, Right Arm, Standard)   Physical Exam General Mental Status -Alert, cooperative and good historian. General Appearance-pleasant, Not in acute distress. Orientation-Oriented X3. Build & Nutrition-Well nourished and Well developed.  Head and Neck Head-normocephalic, atraumatic . Neck Global Assessment - supple, no bruit auscultated on the right, no bruit auscultated on the left.  Eye Vision-Wears corrective lenses. Pupil - Bilateral-Regular and Round. Motion -  Bilateral-EOMI.  Chest and Lung Exam Auscultation Breath sounds - clear at anterior chest wall and clear at posterior chest wall. Adventitious sounds - No Adventitious sounds.  Cardiovascular Auscultation Rhythm - Regular rate and rhythm. Heart Sounds - S1 WNL and S2 WNL. Murmurs & Other Heart Sounds - Auscultation of the heart reveals - No Murmurs.  Abdomen Palpation/Percussion Tenderness - Abdomen is non-tender to palpation. Rigidity (guarding) - Abdomen is soft. Auscultation Auscultation of the abdomen reveals - Bowel sounds normal.  Female Genitourinary Note: Not done, not pertinent to present illness   Musculoskeletal Note: She is alert and oriented, in no apparent distress. Evaluation of her hips show normal range of motion with no discomfort. Her right knee shows no effusion. Range about 0 to 130. There is slight crepitus in range of motion, some tenderness medially greater than laterally and no instability. Left knee, no effusion, range of motion is about 0 to 130, moderate crepitus on range of motion, tenderness medial greater than lateral, no instability noted. Pulse, sensation and motor are intact.  RADIOGRAPHS AP both knees and lateral show that she has the bone-on-bone arthritis, medial patellofemoral, left side worse than the right.   Assessment & Plan Primary osteoarthritis of left knee (M17.12)  Note:Surgical Plans: Left Total Knee Replacement  Disposition:   PCP: Dr. Shanon Ace - Patient has been seen preoperatively and felt to be stable for surgery.  IV TXA  Anesthesia Issues: None  Signed electronically by Joelene Millin, III PA-C

## 2015-04-14 ENCOUNTER — Encounter (HOSPITAL_COMMUNITY): Payer: Self-pay | Admitting: *Deleted

## 2015-04-14 ENCOUNTER — Inpatient Hospital Stay (HOSPITAL_COMMUNITY): Payer: Medicare Other | Admitting: Registered Nurse

## 2015-04-14 ENCOUNTER — Inpatient Hospital Stay (HOSPITAL_COMMUNITY)
Admission: RE | Admit: 2015-04-14 | Discharge: 2015-04-16 | DRG: 470 | Disposition: A | Discharge status: Home Health | Payer: Medicare Other | Source: Ambulatory Visit | Attending: Orthopedic Surgery | Admitting: Orthopedic Surgery

## 2015-04-14 ENCOUNTER — Encounter (HOSPITAL_COMMUNITY)
Admission: RE | Disposition: A | Discharge status: Home Health | Payer: Self-pay | Source: Ambulatory Visit | Attending: Orthopedic Surgery

## 2015-04-14 DIAGNOSIS — M25762 Osteophyte, left knee: Secondary | ICD-10-CM | POA: Diagnosis present

## 2015-04-14 DIAGNOSIS — Z87891 Personal history of nicotine dependence: Secondary | ICD-10-CM | POA: Diagnosis not present

## 2015-04-14 DIAGNOSIS — M1710 Unilateral primary osteoarthritis, unspecified knee: Secondary | ICD-10-CM | POA: Diagnosis present

## 2015-04-14 DIAGNOSIS — Z96659 Presence of unspecified artificial knee joint: Secondary | ICD-10-CM

## 2015-04-14 DIAGNOSIS — M179 Osteoarthritis of knee, unspecified: Secondary | ICD-10-CM | POA: Diagnosis not present

## 2015-04-14 DIAGNOSIS — M25562 Pain in left knee: Secondary | ICD-10-CM | POA: Diagnosis not present

## 2015-04-14 DIAGNOSIS — M1712 Unilateral primary osteoarthritis, left knee: Secondary | ICD-10-CM | POA: Diagnosis present

## 2015-04-14 DIAGNOSIS — I10 Essential (primary) hypertension: Secondary | ICD-10-CM | POA: Diagnosis present

## 2015-04-14 DIAGNOSIS — E7800 Pure hypercholesterolemia, unspecified: Secondary | ICD-10-CM | POA: Diagnosis present

## 2015-04-14 DIAGNOSIS — M810 Age-related osteoporosis without current pathological fracture: Secondary | ICD-10-CM | POA: Diagnosis present

## 2015-04-14 DIAGNOSIS — K219 Gastro-esophageal reflux disease without esophagitis: Secondary | ICD-10-CM | POA: Diagnosis present

## 2015-04-14 DIAGNOSIS — G8918 Other acute postprocedural pain: Secondary | ICD-10-CM | POA: Diagnosis not present

## 2015-04-14 HISTORY — PX: TOTAL KNEE ARTHROPLASTY: SHX125

## 2015-04-14 LAB — TYPE AND SCREEN
ABO/RH(D): O NEG
ANTIBODY SCREEN: NEGATIVE

## 2015-04-14 SURGERY — ARTHROPLASTY, KNEE, TOTAL
Anesthesia: General | Site: Knee | Laterality: Left

## 2015-04-14 MED ORDER — LACTATED RINGERS IV SOLN
INTRAVENOUS | Status: DC | PRN
  Administered 2015-04-14: 11:00:00 via INTRAVENOUS

## 2015-04-14 MED ORDER — MIDAZOLAM HCL 2 MG/2ML IJ SOLN
INTRAMUSCULAR | Status: AC
  Filled 2015-04-14: qty 2

## 2015-04-14 MED ORDER — ONDANSETRON HCL 4 MG/2ML IJ SOLN
INTRAMUSCULAR | Status: DC | PRN
  Administered 2015-04-14: 4 mg via INTRAVENOUS

## 2015-04-14 MED ORDER — SODIUM CHLORIDE 0.9 % IJ SOLN
INTRAMUSCULAR | Status: AC
  Filled 2015-04-14: qty 50

## 2015-04-14 MED ORDER — MENTHOL 3 MG MT LOZG
1.0000 | LOZENGE | OROMUCOSAL | Status: DC | PRN
Start: 2015-04-14 — End: 2015-04-16

## 2015-04-14 MED ORDER — ROPIVACAINE HCL 5 MG/ML IJ SOLN
INTRAMUSCULAR | Status: AC
  Filled 2015-04-14: qty 30

## 2015-04-14 MED ORDER — BUPIVACAINE HCL (PF) 0.25 % IJ SOLN
INTRAMUSCULAR | Status: AC
  Filled 2015-04-14: qty 30

## 2015-04-14 MED ORDER — ROCURONIUM BROMIDE 100 MG/10ML IV SOLN
INTRAVENOUS | Status: DC | PRN
  Administered 2015-04-14: 30 mg via INTRAVENOUS

## 2015-04-14 MED ORDER — KETOROLAC TROMETHAMINE 15 MG/ML IJ SOLN
INTRAMUSCULAR | Status: AC
  Filled 2015-04-14: qty 1

## 2015-04-14 MED ORDER — SUCCINYLCHOLINE CHLORIDE 20 MG/ML IJ SOLN
INTRAMUSCULAR | Status: DC | PRN
  Administered 2015-04-14: 100 mg via INTRAVENOUS

## 2015-04-14 MED ORDER — DOCUSATE SODIUM 100 MG PO CAPS
100.0000 mg | ORAL_CAPSULE | Freq: Two times a day (BID) | ORAL | Status: DC
  Administered 2015-04-14 – 2015-04-16 (×4): 100 mg via ORAL

## 2015-04-14 MED ORDER — DEXAMETHASONE SODIUM PHOSPHATE 10 MG/ML IJ SOLN
INTRAMUSCULAR | Status: AC
  Filled 2015-04-14: qty 1

## 2015-04-14 MED ORDER — LACTATED RINGERS IV SOLN
INTRAVENOUS | Status: DC
  Administered 2015-04-14: 14:00:00 via INTRAVENOUS

## 2015-04-14 MED ORDER — LIDOCAINE HCL (CARDIAC) 20 MG/ML IV SOLN
INTRAVENOUS | Status: AC
  Filled 2015-04-14: qty 5

## 2015-04-14 MED ORDER — DEXAMETHASONE SODIUM PHOSPHATE 10 MG/ML IJ SOLN: 10.0000 mg | Freq: Once | INTRAMUSCULAR | Status: DC

## 2015-04-14 MED ORDER — ONDANSETRON HCL 4 MG/2ML IJ SOLN
INTRAMUSCULAR | Status: AC
  Filled 2015-04-14: qty 2

## 2015-04-14 MED ORDER — HYDROMORPHONE HCL 1 MG/ML IJ SOLN
INTRAMUSCULAR | Status: DC | PRN
  Administered 2015-04-14 (×4): 0.5 mg via INTRAVENOUS

## 2015-04-14 MED ORDER — ONDANSETRON HCL 4 MG PO TABS: 4.0000 mg | ORAL_TABLET | Freq: Four times a day (QID) | ORAL | Status: DC | PRN

## 2015-04-14 MED ORDER — ONDANSETRON HCL 4 MG/2ML IJ SOLN: 4.0000 mg | Freq: Four times a day (QID) | INTRAMUSCULAR | Status: DC | PRN

## 2015-04-14 MED ORDER — METOCLOPRAMIDE HCL 10 MG PO TABS: 5.0000 mg | ORAL_TABLET | Freq: Three times a day (TID) | ORAL | Status: DC | PRN

## 2015-04-14 MED ORDER — ROCURONIUM BROMIDE 100 MG/10ML IV SOLN
INTRAVENOUS | Status: AC
  Filled 2015-04-14: qty 1

## 2015-04-14 MED ORDER — LOSARTAN POTASSIUM 50 MG PO TABS
100.0000 mg | ORAL_TABLET | Freq: Every day | ORAL | Status: DC
  Administered 2015-04-15 – 2015-04-16 (×2): 100 mg via ORAL
  Filled 2015-04-14 (×2): qty 2

## 2015-04-14 MED ORDER — MORPHINE SULFATE (PF) 2 MG/ML IV SOLN
1.0000 mg | INTRAVENOUS | Status: DC | PRN
  Administered 2015-04-15 – 2015-04-16 (×2): 1 mg via INTRAVENOUS
  Filled 2015-04-14 (×3): qty 1

## 2015-04-14 MED ORDER — ACETAMINOPHEN 500 MG PO TABS
1000.0000 mg | ORAL_TABLET | Freq: Four times a day (QID) | ORAL | Status: AC
  Administered 2015-04-14 – 2015-04-15 (×4): 1000 mg via ORAL
  Filled 2015-04-14 (×4): qty 2

## 2015-04-14 MED ORDER — CHLORHEXIDINE GLUCONATE 4 % EX LIQD: 60.0000 mL | Freq: Once | CUTANEOUS | Status: DC

## 2015-04-14 MED ORDER — SUGAMMADEX SODIUM 200 MG/2ML IV SOLN
INTRAVENOUS | Status: DC | PRN
  Administered 2015-04-14: 130 mg via INTRAVENOUS

## 2015-04-14 MED ORDER — PROPOFOL 10 MG/ML IV BOLUS
INTRAVENOUS | Status: AC
  Filled 2015-04-14: qty 40

## 2015-04-14 MED ORDER — LIDOCAINE HCL (CARDIAC) 20 MG/ML IV SOLN
INTRAVENOUS | Status: DC | PRN
  Administered 2015-04-14: 50 mg via INTRAVENOUS

## 2015-04-14 MED ORDER — DIPHENHYDRAMINE HCL 12.5 MG/5ML PO ELIX: 12.5000 mg | ORAL_SOLUTION | ORAL | Status: DC | PRN

## 2015-04-14 MED ORDER — FLEET ENEMA 7-19 GM/118ML RE ENEM: 1.0000 | ENEMA | Freq: Once | RECTAL | Status: DC | PRN

## 2015-04-14 MED ORDER — PROPOFOL 10 MG/ML IV BOLUS
INTRAVENOUS | Status: DC | PRN
  Administered 2015-04-14: 150 mg via INTRAVENOUS

## 2015-04-14 MED ORDER — ACETAMINOPHEN 10 MG/ML IV SOLN
1000.0000 mg | Freq: Once | INTRAVENOUS | Status: AC
  Administered 2015-04-14: 1000 mg via INTRAVENOUS

## 2015-04-14 MED ORDER — BISACODYL 10 MG RE SUPP: 10.0000 mg | Freq: Every day | RECTAL | Status: DC | PRN

## 2015-04-14 MED ORDER — FENTANYL CITRATE (PF) 100 MCG/2ML IJ SOLN
INTRAMUSCULAR | Status: DC | PRN
  Administered 2015-04-14 (×4): 50 ug via INTRAVENOUS

## 2015-04-14 MED ORDER — HYDROMORPHONE HCL 2 MG/ML IJ SOLN
INTRAMUSCULAR | Status: AC
  Filled 2015-04-14: qty 1

## 2015-04-14 MED ORDER — HYDROCHLOROTHIAZIDE 25 MG PO TABS
25.0000 mg | ORAL_TABLET | Freq: Every day | ORAL | Status: DC
  Administered 2015-04-15 – 2015-04-16 (×2): 25 mg via ORAL
  Filled 2015-04-14 (×2): qty 1

## 2015-04-14 MED ORDER — MEPERIDINE HCL 50 MG/ML IJ SOLN: 6.2500 mg | INTRAMUSCULAR | Status: DC | PRN

## 2015-04-14 MED ORDER — SODIUM CHLORIDE 0.9 % IJ SOLN
INTRAMUSCULAR | Status: DC | PRN
  Administered 2015-04-14: 30 mL via INTRAVENOUS

## 2015-04-14 MED ORDER — FENTANYL CITRATE (PF) 100 MCG/2ML IJ SOLN
INTRAMUSCULAR | Status: AC
  Filled 2015-04-14: qty 2

## 2015-04-14 MED ORDER — PROMETHAZINE HCL 25 MG/ML IJ SOLN: 6.2500 mg | INTRAMUSCULAR | Status: DC | PRN

## 2015-04-14 MED ORDER — FENTANYL CITRATE (PF) 100 MCG/2ML IJ SOLN
25.0000 ug | INTRAMUSCULAR | Status: DC | PRN
  Administered 2015-04-14 (×2): 50 ug via INTRAVENOUS

## 2015-04-14 MED ORDER — BUPIVACAINE LIPOSOME 1.3 % IJ SUSP
20.0000 mL | Freq: Once | INTRAMUSCULAR | Status: DC
  Filled 2015-04-14: qty 20

## 2015-04-14 MED ORDER — METHOCARBAMOL 500 MG PO TABS
500.0000 mg | ORAL_TABLET | Freq: Four times a day (QID) | ORAL | Status: DC | PRN
  Administered 2015-04-15 (×4): 500 mg via ORAL
  Filled 2015-04-14 (×4): qty 1

## 2015-04-14 MED ORDER — DEXAMETHASONE SODIUM PHOSPHATE 10 MG/ML IJ SOLN
10.0000 mg | Freq: Once | INTRAMUSCULAR | Status: AC
  Administered 2015-04-15: 10 mg via INTRAVENOUS
  Filled 2015-04-14 (×2): qty 1

## 2015-04-14 MED ORDER — HYDROMORPHONE HCL 1 MG/ML IJ SOLN
INTRAMUSCULAR | Status: AC
  Filled 2015-04-14: qty 1

## 2015-04-14 MED ORDER — CEFAZOLIN SODIUM-DEXTROSE 2-3 GM-% IV SOLR
INTRAVENOUS | Status: AC
  Filled 2015-04-14: qty 50

## 2015-04-14 MED ORDER — HYDROMORPHONE HCL 1 MG/ML IJ SOLN
0.2500 mg | INTRAMUSCULAR | Status: DC | PRN
  Administered 2015-04-14 (×2): 0.5 mg via INTRAVENOUS

## 2015-04-14 MED ORDER — ACETAMINOPHEN 650 MG RE SUPP: 650.0000 mg | Freq: Four times a day (QID) | RECTAL | Status: DC | PRN

## 2015-04-14 MED ORDER — SODIUM CHLORIDE 0.9 % IV SOLN
INTRAVENOUS | Status: DC
  Administered 2015-04-14: 18:00:00 via INTRAVENOUS

## 2015-04-14 MED ORDER — METOCLOPRAMIDE HCL 5 MG/ML IJ SOLN: 5.0000 mg | Freq: Three times a day (TID) | INTRAMUSCULAR | Status: DC | PRN

## 2015-04-14 MED ORDER — LOSARTAN POTASSIUM-HCTZ 100-25 MG PO TABS: 1.0000 | ORAL_TABLET | Freq: Every day | ORAL | Status: DC

## 2015-04-14 MED ORDER — MIDAZOLAM HCL 2 MG/2ML IJ SOLN
0.5000 mg | Freq: Once | INTRAMUSCULAR | Status: AC
  Administered 2015-04-14: 0.5 mg via INTRAVENOUS

## 2015-04-14 MED ORDER — METHOCARBAMOL 1000 MG/10ML IJ SOLN
500.0000 mg | Freq: Four times a day (QID) | INTRAVENOUS | Status: DC | PRN
  Administered 2015-04-14 (×2): 500 mg via INTRAVENOUS
  Filled 2015-04-14 (×4): qty 5

## 2015-04-14 MED ORDER — MIDAZOLAM HCL 5 MG/5ML IJ SOLN
INTRAMUSCULAR | Status: DC | PRN
  Administered 2015-04-14: 2 mg via INTRAVENOUS

## 2015-04-14 MED ORDER — POLYETHYLENE GLYCOL 3350 17 G PO PACK: 17.0000 g | PACK | Freq: Every day | ORAL | Status: DC | PRN

## 2015-04-14 MED ORDER — DEXAMETHASONE SODIUM PHOSPHATE 10 MG/ML IJ SOLN
INTRAMUSCULAR | Status: DC | PRN
  Administered 2015-04-14: 10 mg via INTRAVENOUS

## 2015-04-14 MED ORDER — SUGAMMADEX SODIUM 200 MG/2ML IV SOLN
INTRAVENOUS | Status: AC
  Filled 2015-04-14: qty 2

## 2015-04-14 MED ORDER — PHENOL 1.4 % MT LIQD
1.0000 | OROMUCOSAL | Status: DC | PRN
Start: 2015-04-14 — End: 2015-04-16
  Filled 2015-04-14: qty 177

## 2015-04-14 MED ORDER — ACETAMINOPHEN 325 MG PO TABS
650.0000 mg | ORAL_TABLET | Freq: Four times a day (QID) | ORAL | Status: DC | PRN
  Administered 2015-04-15 – 2015-04-16 (×2): 650 mg via ORAL
  Filled 2015-04-14 (×2): qty 2

## 2015-04-14 MED ORDER — CEFAZOLIN SODIUM-DEXTROSE 2-3 GM-% IV SOLR
2.0000 g | Freq: Four times a day (QID) | INTRAVENOUS | Status: AC
  Administered 2015-04-14 – 2015-04-15 (×2): 2 g via INTRAVENOUS
  Filled 2015-04-14 (×2): qty 50

## 2015-04-14 MED ORDER — KETOROLAC TROMETHAMINE 15 MG/ML IJ SOLN
7.5000 mg | Freq: Four times a day (QID) | INTRAMUSCULAR | Status: AC | PRN
  Administered 2015-04-14 – 2015-04-15 (×3): 7.5 mg via INTRAVENOUS
  Filled 2015-04-14 (×2): qty 1

## 2015-04-14 MED ORDER — TRAMADOL HCL 50 MG PO TABS: 50.0000 mg | ORAL_TABLET | Freq: Four times a day (QID) | ORAL | Status: DC | PRN

## 2015-04-14 MED ORDER — ACETAMINOPHEN 10 MG/ML IV SOLN
INTRAVENOUS | Status: AC
  Filled 2015-04-14: qty 100

## 2015-04-14 MED ORDER — RIVAROXABAN 10 MG PO TABS
10.0000 mg | ORAL_TABLET | Freq: Every day | ORAL | Status: DC
  Administered 2015-04-15 – 2015-04-16 (×2): 10 mg via ORAL
  Filled 2015-04-14 (×3): qty 1

## 2015-04-14 MED ORDER — CEFAZOLIN SODIUM-DEXTROSE 2-3 GM-% IV SOLR
2.0000 g | INTRAVENOUS | Status: AC
  Administered 2015-04-14: 2 g via INTRAVENOUS

## 2015-04-14 MED ORDER — ROPIVACAINE HCL 5 MG/ML IJ SOLN
INTRAMUSCULAR | Status: DC | PRN
  Administered 2015-04-14: 25 mL via PERINEURAL

## 2015-04-14 MED ORDER — OXYCODONE HCL 5 MG PO TABS
5.0000 mg | ORAL_TABLET | ORAL | Status: DC | PRN
  Administered 2015-04-14: 5 mg via ORAL
  Administered 2015-04-14 – 2015-04-16 (×10): 10 mg via ORAL
  Filled 2015-04-14 (×9): qty 2
  Filled 2015-04-14: qty 1
  Filled 2015-04-14: qty 2

## 2015-04-14 MED ORDER — TRANEXAMIC ACID 1000 MG/10ML IV SOLN
1000.0000 mg | INTRAVENOUS | Status: AC
  Administered 2015-04-14: 1000 mg via INTRAVENOUS
  Filled 2015-04-14: qty 10

## 2015-04-14 MED ORDER — SODIUM CHLORIDE 0.9 % IV SOLN: INTRAVENOUS | Status: DC

## 2015-04-14 MED ORDER — BUPIVACAINE LIPOSOME 1.3 % IJ SUSP
INTRAMUSCULAR | Status: DC | PRN
  Administered 2015-04-14: 20 mL

## 2015-04-14 SURGICAL SUPPLY — 52 items
BAG DECANTER FOR FLEXI CONT (MISCELLANEOUS) ×2 IMPLANT
BAG SPEC THK2 15X12 ZIP CLS (MISCELLANEOUS) ×1
BAG ZIPLOCK 12X15 (MISCELLANEOUS) ×2 IMPLANT
BANDAGE ELASTIC 4 VELCRO ST LF (GAUZE/BANDAGES/DRESSINGS) ×2 IMPLANT
BANDAGE ELASTIC 6 VELCRO ST LF (GAUZE/BANDAGES/DRESSINGS) ×2 IMPLANT
BLADE SAG 18X100X1.27 (BLADE) ×2 IMPLANT
BLADE SAW SGTL 11.0X1.19X90.0M (BLADE) ×2 IMPLANT
BOWL SMART MIX CTS (DISPOSABLE) ×2 IMPLANT
CAPT KNEE TOTAL 3 ATTUNE ×2 IMPLANT
CEMENT HV SMART SET (Cement) ×4 IMPLANT
CLOTH BEACON ORANGE TIMEOUT ST (SAFETY) ×2 IMPLANT
CUFF TOURN SGL QUICK 34 (TOURNIQUET CUFF) ×2
CUFF TRNQT CYL 34X4X40X1 (TOURNIQUET CUFF) ×1 IMPLANT
DECANTER SPIKE VIAL GLASS SM (MISCELLANEOUS) ×2 IMPLANT
DRAPE U-SHAPE 47X51 STRL (DRAPES) ×2 IMPLANT
DRSG ADAPTIC 3X8 NADH LF (GAUZE/BANDAGES/DRESSINGS) ×2 IMPLANT
DRSG PAD ABDOMINAL 8X10 ST (GAUZE/BANDAGES/DRESSINGS) ×2 IMPLANT
DURAPREP 26ML APPLICATOR (WOUND CARE) ×2 IMPLANT
ELECT REM PT RETURN 9FT ADLT (ELECTROSURGICAL) ×2
ELECTRODE REM PT RTRN 9FT ADLT (ELECTROSURGICAL) ×1 IMPLANT
EVACUATOR 1/8 PVC DRAIN (DRAIN) ×2 IMPLANT
GAUZE SPONGE 4X4 12PLY STRL (GAUZE/BANDAGES/DRESSINGS) ×2 IMPLANT
GAUZE SPONGE 4X4 16PLY XRAY LF (GAUZE/BANDAGES/DRESSINGS) ×2 IMPLANT
GLOVE BIO SURGEON STRL SZ7.5 (GLOVE) IMPLANT
GLOVE BIO SURGEON STRL SZ8 (GLOVE) ×2 IMPLANT
GLOVE BIOGEL PI IND STRL 6.5 (GLOVE) ×1 IMPLANT
GLOVE BIOGEL PI IND STRL 8 (GLOVE) ×1 IMPLANT
GLOVE BIOGEL PI INDICATOR 6.5 (GLOVE) ×1
GLOVE BIOGEL PI INDICATOR 8 (GLOVE) ×1
GLOVE SURG SS PI 6.5 STRL IVOR (GLOVE) ×8 IMPLANT
GOWN STRL REUS W/TWL LRG LVL3 (GOWN DISPOSABLE) ×2 IMPLANT
GOWN STRL REUS W/TWL XL LVL3 (GOWN DISPOSABLE) IMPLANT
HANDPIECE INTERPULSE COAX TIP (DISPOSABLE) ×2
IMMOBILIZER KNEE 20 (SOFTGOODS) ×2
IMMOBILIZER KNEE 20 THIGH 36 (SOFTGOODS) ×1 IMPLANT
MANIFOLD NEPTUNE II (INSTRUMENTS) ×2 IMPLANT
NS IRRIG 1000ML POUR BTL (IV SOLUTION) ×2 IMPLANT
PACK TOTAL KNEE CUSTOM (KITS) ×2 IMPLANT
PADDING CAST COTTON 6X4 STRL (CAST SUPPLIES) ×2 IMPLANT
POSITIONER SURGICAL ARM (MISCELLANEOUS) ×2 IMPLANT
SET HNDPC FAN SPRY TIP SCT (DISPOSABLE) ×1 IMPLANT
STRIP CLOSURE SKIN 1/2X4 (GAUZE/BANDAGES/DRESSINGS) ×4 IMPLANT
SUT MNCRL AB 4-0 PS2 18 (SUTURE) ×2 IMPLANT
SUT VIC AB 2-0 CT1 27 (SUTURE) ×6
SUT VIC AB 2-0 CT1 TAPERPNT 27 (SUTURE) ×3 IMPLANT
SUT VLOC 180 0 24IN GS25 (SUTURE) ×2 IMPLANT
SYR 50ML LL SCALE MARK (SYRINGE) ×2 IMPLANT
TRAY FOLEY W/METER SILVER 14FR (SET/KITS/TRAYS/PACK) ×2 IMPLANT
TRAY FOLEY W/METER SILVER 16FR (SET/KITS/TRAYS/PACK) ×2 IMPLANT
WATER STERILE IRR 1500ML POUR (IV SOLUTION) ×2 IMPLANT
WRAP KNEE MAXI GEL POST OP (GAUZE/BANDAGES/DRESSINGS) ×2 IMPLANT
YANKAUER SUCT BULB TIP 10FT TU (MISCELLANEOUS) ×2 IMPLANT

## 2015-04-14 NOTE — Progress Notes (Signed)
Utilization review completed.  

## 2015-04-14 NOTE — Transfer of Care (Signed)
Immediate Anesthesia Transfer of Care Note  Patient: Jacqueline Myers  Procedure(s) Performed: Procedure(s): LEFT TOTAL KNEE ARTHROPLASTY (Left)  Patient Location: PACU  Anesthesia Type:General  Level of Consciousness: awake, alert , oriented and patient cooperative  Airway & Oxygen Therapy: Patient Spontanous Breathing and Patient connected to face mask oxygen  Post-op Assessment: Report given to RN, Post -op Vital signs reviewed and stable and Patient moving all extremities  Post vital signs: Reviewed and stable  Last Vitals:  Filed Vitals:   04/14/15 1024  BP: 162/65  Pulse: 96  Temp: 36.9 C  Resp: 18    Complications: No apparent anesthesia complications

## 2015-04-14 NOTE — Op Note (Signed)
Pre-operative diagnosis- Osteoarthritis  Left knee(s)  Post-operative diagnosis- Osteoarthritis Left knee(s)  Procedure-  Left  Total Knee Arthroplasty  Surgeon- Dione Plover. Kabeer Hoagland, MD  Assistant- Arlee Muslim, PA-C   Anesthesia-  GA combined with regional for post-op pain  EBL-* No blood loss amount entered *   Drains Hemovac  Tourniquet time-  Total Tourniquet Time Documented: Thigh (Left) - 30 minutes Total: Thigh (Left) - 30 minutes     Complications- None  Condition-PACU - hemodynamically stable.   Brief Clinical Note  Jacqueline Myers is a 64 y.o. year old female with end stage OA of her left knee with progressively worsening pain and dysfunction. She has constant pain, with activity and at rest and significant functional deficits with difficulties even with ADLs. She has had extensive non-op management including analgesics, injections of cortisone and viscosupplements, and home exercise program, but remains in significant pain with significant dysfunction. Radiographs show bone on bone arthritis medial and patellofemoral. She presents now for left Total Knee Arthroplasty.    Procedure in detail---   The patient is brought into the operating room and positioned supine on the operating table. After successful administration of  GA combined with regional for post-op pain,   a tourniquet is placed high on the  Left thigh(s) and the lower extremity is prepped and draped in the usual sterile fashion. Time out is performed by the operating team and then the  Left lower extremity is wrapped in Esmarch, knee flexed and the tourniquet inflated to 300 mmHg.       A midline incision is made with a ten blade through the subcutaneous tissue to the level of the extensor mechanism. A fresh blade is used to make a medial parapatellar arthrotomy. Soft tissue over the proximal medial tibia is subperiosteally elevated to the joint line with a knife and into the semimembranosus bursa with a Cobb  elevator. Soft tissue over the proximal lateral tibia is elevated with attention being paid to avoiding the patellar tendon on the tibial tubercle. The patella is everted, knee flexed 90 degrees and the ACL and PCL are removed. Findings are bone on bone medial and patellofemoral with large global osteophytes.        The drill is used to create a starting hole in the distal femur and the canal is thoroughly irrigated with sterile saline to remove the fatty contents. The 5 degree Left  valgus alignment guide is placed into the femoral canal and the distal femoral cutting block is pinned to remove 9 mm off the distal femur. Resection is made with an oscillating saw.      The tibia is subluxed forward and the menisci are removed. The extramedullary alignment guide is placed referencing proximally at the medial aspect of the tibial tubercle and distally along the second metatarsal axis and tibial crest. The block is pinned to remove 53mm off the more deficient medial  side. Resection is made with an oscillating saw. Size 5is the most appropriate size for the tibia and the proximal tibia is prepared with the modular drill and keel punch for that size.      The femoral sizing guide is placed and size 6 is most appropriate. Rotation is marked off the epicondylar axis and confirmed by creating a rectangular flexion gap at 90 degrees. The size 6 cutting block is pinned in this rotation and the anterior, posterior and chamfer cuts are made with the oscillating saw. The intercondylar block is then placed and that cut  is made.      Trial size 5 tibial component, trial size 6 posterior stabilized femur and a 6  mm posterior stabilized rotating platform insert trial is placed. Full extension is achieved with excellent varus/valgus and anterior/posterior balance throughout full range of motion. The patella is everted and thickness measured to be 22  mm. Free hand resection is taken to 12 mm, a 35 template is placed, lug holes  are drilled, trial patella is placed, and it tracks normally. Osteophytes are removed off the posterior femur with the trial in place. All trials are removed and the cut bone surfaces prepared with pulsatile lavage. Cement is mixed and once ready for implantation, the size 5 tibial implant, size  6 narrow posterior stabilized femoral component, and the size 35 patella are cemented in place and the patella is held with the clamp. The trial insert is placed and the knee held in full extension. The Exparel (20 ml mixed with 30 ml saline) is injected into the extensor mechanism, posterior capsule, medial and lateral gutters and subcutaneous tissues.  All extruded cement is removed and once the cement is hard the permanent 6 mm posterior stabilized rotating platform insert is placed into the tibial tray.      The wound is copiously irrigated with saline solution and the extensor mechanism closed over a hemovac drain with #1 V-loc suture. The tourniquet is released for a total tourniquet time of 30  minutes. Flexion against gravity is 140 degrees and the patella tracks normally. Subcutaneous tissue is closed with 2.0 vicryl and subcuticular with running 4.0 Monocryl. The incision is cleaned and dried and steri-strips and a bulky sterile dressing are applied. The limb is placed into a knee immobilizer and the patient is awakened and transported to recovery in stable condition.      Please note that a surgical assistant was a medical necessity for this procedure in order to perform it in a safe and expeditious manner. Surgical assistant was necessary to retract the ligaments and vital neurovascular structures to prevent injury to them and also necessary for proper positioning of the limb to allow for anatomic placement of the prosthesis.   Dione Plover Jacqueline Hahne, MD    04/14/2015, 1:24 PM

## 2015-04-14 NOTE — Anesthesia Preprocedure Evaluation (Addendum)
Anesthesia Evaluation  Patient identified by MRN, date of birth, ID band Patient awake    Reviewed: Allergy & Precautions, NPO status , Patient's Chart, lab work & pertinent test results  Airway Mallampati: II  TM Distance: >3 FB Neck ROM: Full    Dental no notable dental hx.    Pulmonary former smoker,    Pulmonary exam normal breath sounds clear to auscultation       Cardiovascular hypertension, Pt. on medications Normal cardiovascular exam Rhythm:Regular Rate:Normal     Neuro/Psych negative neurological ROS  negative psych ROS   GI/Hepatic negative GI ROS, Neg liver ROS,   Endo/Other  negative endocrine ROS  Renal/GU negative Renal ROS  negative genitourinary   Musculoskeletal negative musculoskeletal ROS (+)   Abdominal   Peds negative pediatric ROS (+)  Hematology negative hematology ROS (+)   Anesthesia Other Findings   Reproductive/Obstetrics negative OB ROS                          Anesthesia Physical Anesthesia Plan  ASA: II  Anesthesia Plan: General   Post-op Pain Management: GA combined w/ Regional for post-op pain   Induction: Intravenous  Airway Management Planned: Oral ETT  Additional Equipment:   Intra-op Plan:   Post-operative Plan: Extubation in OR  Informed Consent: I have reviewed the patients History and Physical, chart, labs and discussed the procedure including the risks, benefits and alternatives for the proposed anesthesia with the patient or authorized representative who has indicated his/her understanding and acceptance.   Dental advisory given  Plan Discussed with: CRNA  Anesthesia Plan Comments: (Spinal Stenosis. Not a candidate for SAB.  GA plus adductor canal block )       Anesthesia Quick Evaluation

## 2015-04-14 NOTE — H&P (View-Only) (Signed)
Jacqueline Myers DOB: May 02, 1951 Married / Language: English / Race: White Female Date of Admission:  04/14/2015 CC:  Left Knee Pain History of Present Illness The patient is a 64 year old female who comes in for a preoperative History and Physical. The patient is scheduled for a left total knee arthroplasty to be performed by Dr. Dione Plover. Aluisio, MD at [facility] on 04-14-2015. The patient was seen for a second opinion. The patient reports left knee and right knee symptoms including: pain (medial. left knee hurts the worse.), swelling, locking, catching, giving way, stiffness and grinding which began 10 year(s) ago. The patient describes their pain as aching.The patient feels that the symptoms are worsening. The patient has the current diagnosis of knee osteoarthritis. Past treatment for this problem has included intra-articular injection of corticosteroids (visco did not help.). Note for "Knee pain": bilateral visco injection 2015. Pt. had been applying voltaren gel. She has been treated in the past by Dr. Almedia Balls. She has had cortisone injections, which in the past had helped for short amount of time. She had viscosupplement injections, which did not provide much benefit. She states that the left knee is more symptomatic than the right. Both knees are painful, but the left is worse. It is limiting what she can and cannot do. She does get pain with all activities and now starting to get pain at rest and at night. She is at a stage now where she cannot tolerate this anymore and is having a negative effect on her lifestyle. She is not having any associated hip pain, back pain, lower extremity weakness or paresthesia. She is ready to proceed with surgery for the knee at this time. They have been treated conservatively in the past for the above stated problem and despite conservative measures, they continue to have progressive pain and severe functional limitations and dysfunction. They have  failed non-operative management including home exercise, medications, and injections. It is felt that they would benefit from undergoing total joint replacement. Risks and benefits of the procedure have been discussed with the patient and they elect to proceed with surgery. There are no active contraindications to surgery such as ongoing infection or rapidly progressive neurological disease.  Problem List/Past Medical  Primary osteoarthritis of left knee (M17.12)  Arthritis of knee (M19.90)  left Hypercholesterolemia  Osteoarthritis  Osteoporosis  Gastroesophageal Reflux Disease  High blood pressure   Allergies (Alexzandrew L Perkins, III PA-C; 03/18/2015 3:55 PM) No Known Drug Allergies 03/18/2015 (Marked as Inactive) Statins Support *DIETARY PRODUCTS/DIETARY MANAGEMENT PRODUCTS*  leg cramps and pain  Family History Congestive Heart Failure  mother Depression  father Diabetes Mellitus  father Cancer  sister Hypertension  father Osteoarthritis  mother and father  Social History  Living situation  live with spouse Marital status  married No history of drug/alcohol rehab  Exercise  Exercises weekly; does running / walking Children  2 Current drinker  12/16/2014: Currently drinks wine only occasionally per week Current work status  retired Tobacco use  12/16/2014: former smoker smoke(d) 1 pack(s) per day Not under pain contract  Number of flights of stairs before winded  2-3 Tobacco / smoke exposure  12/16/2014: no Post-Surgical Plans  Plan for home following Left Total Knee Replacement  Medication History Losartan Potassium-HCTZ (100-25MG  Tablet, Oral) Active. Cyclobenzaprine HCl (10MG  Tablet, Oral) Active. Citracal +D3 (250-107-500MG -MG-UNIT Tablet Chewable, Oral) Active. Omega 3 500 (500MG  Capsule, Oral) Active. Glucosamine 1500 Complex (Oral) Active. Multivitamin Women 50+ (Oral) Active.   Past  Surgical History  Colon Polyp Removal -  Colonoscopy  Tonsillectomy    Review of Systems General Not Present- Chills, Fatigue, Fever, Memory Loss, Night Sweats, Weight Gain and Weight Loss. Skin Not Present- Eczema, Hives, Itching, Lesions and Rash. HEENT Not Present- Dentures, Double Vision, Headache, Hearing Loss, Tinnitus and Visual Loss. Respiratory Not Present- Allergies, Chronic Cough, Coughing up blood, Shortness of breath at rest and Shortness of breath with exertion. Cardiovascular Not Present- Chest Pain, Difficulty Breathing Lying Down, Murmur, Palpitations, Racing/skipping heartbeats and Swelling. Gastrointestinal Present- Heartburn. Not Present- Abdominal Pain, Bloody Stool, Constipation, Diarrhea, Difficulty Swallowing, Jaundice, Loss of appetitie, Nausea and Vomiting. Female Genitourinary Not Present- Blood in Urine, Discharge, Flank Pain, Incontinence, Painful Urination, Urgency, Urinary frequency, Urinary Retention, Urinating at Night and Weak urinary stream. Musculoskeletal Present- Joint Pain and Morning Stiffness. Not Present- Back Pain, Joint Swelling, Muscle Pain, Muscle Weakness and Spasms. Neurological Not Present- Blackout spells, Difficulty with balance, Dizziness, Paralysis, Tremor and Weakness. Psychiatric Not Present- Insomnia.  Vitals  Weight: 143 lb Height: 63in Weight was reported by patient. Height was reported by patient. Body Surface Area: 1.68 m Body Mass Index: 25.33 kg/m  BP: 150/70 (Sitting, Right Arm, Standard)   Physical Exam General Mental Status -Alert, cooperative and good historian. General Appearance-pleasant, Not in acute distress. Orientation-Oriented X3. Build & Nutrition-Well nourished and Well developed.  Head and Neck Head-normocephalic, atraumatic . Neck Global Assessment - supple, no bruit auscultated on the right, no bruit auscultated on the left.  Eye Vision-Wears corrective lenses. Pupil - Bilateral-Regular and Round. Motion -  Bilateral-EOMI.  Chest and Lung Exam Auscultation Breath sounds - clear at anterior chest wall and clear at posterior chest wall. Adventitious sounds - No Adventitious sounds.  Cardiovascular Auscultation Rhythm - Regular rate and rhythm. Heart Sounds - S1 WNL and S2 WNL. Murmurs & Other Heart Sounds - Auscultation of the heart reveals - No Murmurs.  Abdomen Palpation/Percussion Tenderness - Abdomen is non-tender to palpation. Rigidity (guarding) - Abdomen is soft. Auscultation Auscultation of the abdomen reveals - Bowel sounds normal.  Female Genitourinary Note: Not done, not pertinent to present illness   Musculoskeletal Note: She is alert and oriented, in no apparent distress. Evaluation of her hips show normal range of motion with no discomfort. Her right knee shows no effusion. Range about 0 to 130. There is slight crepitus in range of motion, some tenderness medially greater than laterally and no instability. Left knee, no effusion, range of motion is about 0 to 130, moderate crepitus on range of motion, tenderness medial greater than lateral, no instability noted. Pulse, sensation and motor are intact.  RADIOGRAPHS AP both knees and lateral show that she has the bone-on-bone arthritis, medial patellofemoral, left side worse than the right.   Assessment & Plan Primary osteoarthritis of left knee (M17.12)  Note:Surgical Plans: Left Total Knee Replacement  Disposition:   PCP: Dr. Shanon Ace - Patient has been seen preoperatively and felt to be stable for surgery.  IV TXA  Anesthesia Issues: None  Signed electronically by Joelene Millin, III PA-C

## 2015-04-14 NOTE — Anesthesia Postprocedure Evaluation (Signed)
Anesthesia Post Note  Patient: Jacqueline Myers  Procedure(s) Performed: Procedure(s) (LRB): LEFT TOTAL KNEE ARTHROPLASTY (Left)  Patient location during evaluation: PACU Anesthesia Type: General Level of consciousness: awake and alert Pain management: pain level controlled Vital Signs Assessment: post-procedure vital signs reviewed and stable Respiratory status: spontaneous breathing, nonlabored ventilation, respiratory function stable and patient connected to nasal cannula oxygen Cardiovascular status: blood pressure returned to baseline and stable Postop Assessment: no signs of nausea or vomiting Anesthetic complications: no    Last Vitals:  Filed Vitals:   04/14/15 1445 04/14/15 1500  BP: 157/84   Pulse: 95   Temp:  36.6 C  Resp: 19     Last Pain:  Filed Vitals:   04/14/15 1503  PainSc: 4                  [name]

## 2015-04-14 NOTE — Interval H&P Note (Signed)
History and Physical Interval Note:  04/14/2015 11:48 AM  Jacqueline Myers  has presented today for surgery, with the diagnosis of left knee osteoarthritis  The various methods of treatment have been discussed with the patient and family. After consideration of risks, benefits and other options for treatment, the patient has consented to  Procedure(s): LEFT TOTAL KNEE ARTHROPLASTY (Left) as a surgical intervention .  The patient's history has been reviewed, patient examined, no change in status, stable for surgery.  I have reviewed the patient's chart and labs.  Questions were answered to the patient's satisfaction.     [name]

## 2015-04-14 NOTE — Anesthesia Procedure Notes (Addendum)
Anesthesia Regional Block:  Adductor canal block  Pre-Anesthetic Checklist: ,, timeout performed, Correct Patient, Correct Site, Correct Laterality, Correct Procedure, Correct Position, site marked, Risks and benefits discussed,  Surgical consent,  Pre-op evaluation,  At surgeon's request and post-op pain management  Laterality: Left and Lower  Prep: chloraprep       Needles:  Injection technique: Single-shot  Needle Type: Stimiplex     Needle Length: 10cm 10 cm Needle Gauge: 21 and 21 G    Additional Needles:  Procedures: ultrasound guided (picture in chart) Adductor canal block Narrative:  Injection made incrementally with aspirations every 5 mL.  Performed by: Personally   Additional Notes: Patient tolerated the procedure well without complications   Procedure Name: Intubation Date/Time: 04/14/2015 12:29 PM Performed by: Carleene Cooper A Pre-anesthesia Checklist: Patient identified, Emergency Drugs available, Suction available, Patient being monitored and Timeout performed Patient Re-evaluated:Patient Re-evaluated prior to inductionOxygen Delivery Method: Circle system utilized Preoxygenation: Pre-oxygenation with 100% oxygen Intubation Type: IV induction Ventilation: Mask ventilation without difficulty Laryngoscope Size: Mac and 4 Grade View: Grade I Tube type: Oral Tube size: 7.5 mm Number of attempts: 1 Airway Equipment and Method: Stylet Placement Confirmation: ETT inserted through vocal cords under direct vision,  breath sounds checked- equal and bilateral and positive ETCO2 Secured at: 21 cm Tube secured with: Tape Dental Injury: Teeth and Oropharynx as per pre-operative assessment

## 2015-04-15 LAB — BASIC METABOLIC PANEL
Anion gap: 6 (ref 5–15)
BUN: 13 mg/dL (ref 6–20)
CHLORIDE: 103 mmol/L (ref 101–111)
CO2: 26 mmol/L (ref 22–32)
CREATININE: 0.69 mg/dL (ref 0.44–1.00)
Calcium: 8.7 mg/dL — ABNORMAL LOW (ref 8.9–10.3)
GFR calc non Af Amer: >60 mL/min (ref >60)
Glucose, Bld: 130 mg/dL — ABNORMAL HIGH (ref 65–99)
POTASSIUM: 4 mmol/L (ref 3.5–5.1)
Sodium: 135 mmol/L (ref 135–145)

## 2015-04-15 LAB — CBC
HEMATOCRIT: 33.2 % — AB (ref 36.0–46.0)
HEMOGLOBIN: 11.1 g/dL — AB (ref 12.0–15.0)
MCH: 29 pg (ref 26.0–34.0)
MCHC: 33.4 g/dL (ref 30.0–36.0)
MCV: 86.7 fL (ref 78.0–100.0)
Platelets: 223 10*3/uL (ref 150–400)
RBC: 3.83 MIL/uL — AB (ref 3.87–5.11)
RDW: 13.5 % (ref 11.5–15.5)
WBC: 14.9 10*3/uL — ABNORMAL HIGH (ref 4.0–10.5)

## 2015-04-15 MED ORDER — OXYCODONE HCL 5 MG PO TABS: 5.0000 mg | ORAL_TABLET | ORAL | Status: DC | PRN

## 2015-04-15 MED ORDER — METHOCARBAMOL 500 MG PO TABS: 500.0000 mg | ORAL_TABLET | Freq: Four times a day (QID) | ORAL | Status: DC | PRN

## 2015-04-15 MED ORDER — TRAMADOL HCL 50 MG PO TABS: 50.0000 mg | ORAL_TABLET | Freq: Four times a day (QID) | ORAL | Status: DC | PRN

## 2015-04-15 MED ORDER — HYDROCODONE-ACETAMINOPHEN 5-325 MG PO TABS
1.0000 | ORAL_TABLET | ORAL | Status: DC | PRN
  Administered 2015-04-15 – 2015-04-16 (×2): 2 via ORAL
  Filled 2015-04-15 (×2): qty 2

## 2015-04-15 MED ORDER — NON FORMULARY: 20.0000 mg | Freq: Every day | Status: DC

## 2015-04-15 MED ORDER — RIVAROXABAN 10 MG PO TABS: 10.0000 mg | ORAL_TABLET | Freq: Every day | ORAL | Status: DC

## 2015-04-15 MED ORDER — OMEPRAZOLE 20 MG PO CPDR
20.0000 mg | DELAYED_RELEASE_CAPSULE | Freq: Every day | ORAL | Status: DC
  Administered 2015-04-15 – 2015-04-16 (×2): 20 mg via ORAL
  Filled 2015-04-15 (×2): qty 1

## 2015-04-15 NOTE — Progress Notes (Signed)
   Subjective: 1 Day Post-Op Procedure(s) (LRB): LEFT TOTAL KNEE ARTHROPLASTY (Left) Patient reports pain as mild.   Patient seen in rounds by Dr. Wynelle Link.  Doing very well. Patient is well, but has had some minor complaints of pain in the knee, requiring pain medications We will start therapy today.  Plan is to go Home after hospital stay.  Objective: Vital signs in last 24 hours: Temp:  [97.6 F (36.4 C)-98.5 F (36.9 C)] 98 F (36.7 C) (12/08 0544) Pulse Rate:  [79-108] 100 (12/08 0544) Resp:  [12-19] 16 (12/08 0544) BP: (124-171)/(53-84) 139/68 mmHg (12/08 0544) SpO2:  [97 %-100 %] 97 % (12/08 0544) Weight:  [65.318 kg (144 lb)] 65.318 kg (144 lb) (12/07 1540)  Intake/Output from previous day:  Intake/Output Summary (Last 24 hours) at 04/15/15 0729 Last data filed at 04/15/15 0600  Gross per 24 hour  Intake   4860 ml  Output   2502 ml  Net   2358 ml    Intake/Output this shift: UOP 1900 since around [place]  Labs:  Recent Labs  04/15/15 0507  HGB 11.1*    Recent Labs  04/15/15 0507  WBC 14.9*  RBC 3.83*  HCT 33.2*  PLT 223    Recent Labs  04/15/15 0507  NA 135  K 4.0  CL 103  CO2 26  BUN 13  CREATININE 0.69  GLUCOSE 130*  CALCIUM 8.7*   No results for input(s): LABPT, INR in the last 72 hours.  EXAM General - Patient is Alert, Appropriate and Oriented Extremity - Neurovascular intact Sensation intact distally Dorsiflexion/Plantar flexion intact Dressing - dressing C/D/I Motor Function - intact, moving foot and toes well on exam.  Hemovac pulled without difficulty.  Past Medical History  Diagnosis Date  . Hypertension   . Hyperlipidemia   . Spinal stenosis   . Osteoarthritis   . GERD (gastroesophageal reflux disease)   . Diverticulosis   . Colon polyps     hyperplastic  . Osteoporosis   . History of bronchitis   . Mitral valve prolapse     Assessment/Plan: 1 Day Post-Op Procedure(s) (LRB): LEFT TOTAL KNEE ARTHROPLASTY  (Left) Principal Problem:   OA (osteoarthritis) of knee Active Problems:   S/P total knee arthroplasty  Estimated body mass index is 25.51 kg/(m^2) as calculated from the following:   Height as of this encounter: 5\' 3"  (1.6 m).   Weight as of this encounter: 65.318 kg (144 lb). Advance diet Up with therapy Plan for discharge tomorrow Discharge home with home health  DVT Prophylaxis - Xarelto Weight-Bearing as tolerated to left leg D/C O2 and Pulse OX and try on Room Air  Arlee Muslim, PA-C Orthopaedic Surgery 04/15/2015, 7:29 AM

## 2015-04-15 NOTE — Evaluation (Signed)
Physical Therapy Evaluation Patient Details Name: Jacqueline Myers MRN: [id number] DOB: 1950-07-19 Today's Date: 04/15/2015   History of Present Illness  s/p L TKA  Clinical Impression  Patient is progressing well, performs SLR, ambulating without KI. Patient  Will benefit from PT to address problems listed in note below to DC to home.    Follow Up Recommendations Home health PT;Supervision/Assistance - 24 hour    Equipment Recommendations  Rolling walker with 5" wheels    Recommendations for Other Services       Precautions / Restrictions Precautions Precautions: Knee;Fall Required Braces or Orthoses: Knee Immobilizer - Left Knee Immobilizer - Left: Discontinue once straight leg raise with < 10 degree lag Restrictions Weight Bearing Restrictions: No      Mobility  Bed Mobility Overal bed mobility: Needs Assistance Bed Mobility: Supine to Sit;Sit to Supine     Supine to sit: Min assist Sit to supine: Min assist   General bed mobility comments: assist for LLE  Transfers Overall transfer level: Needs assistance Equipment used: Rolling walker (2 wheeled) Transfers: Sit to/from Stand Sit to Stand: Supervision         General transfer comment: manages LLE without the KI  Ambulation/Gait Ambulation/Gait assistance: Min guard Ambulation Distance (Feet): 150 Feet Assistive device: Rolling walker (2 wheeled) Gait Pattern/deviations: Step-to pattern;Step-through pattern     General Gait Details: cues for sequence  Stairs            Wheelchair Mobility    Modified Rankin (Stroke Patients Only)       Balance                                             Pertinent Vitals/Pain Pain Assessment: 0-10 Pain Score: 6  Pain Location: L knee Pain Descriptors / Indicators: Aching;Tightness Pain Intervention(s): Limited activity within patient's tolerance;Monitored during session;Premedicated before session;Ice applied    Home Living  Family/patient expects to be discharged to:: Private residence Living Arrangements: Spouse/significant other Available Help at Discharge: Family Type of Home: House Home Access: Stairs to enter Entrance Stairs-Rails: None   Home Layout: One level Home Equipment: Toilet riser;Shower seat - built in      Prior Function Level of Independence: Independent with assistive device(s)               Hand Dominance        Extremity/Trunk Assessment   Upper Extremity Assessment: Defer to OT evaluation           Lower Extremity Assessment: LLE deficits/detail   LLE Deficits / Details: performs SLR, knee flexion to 60.  Cervical / Trunk Assessment: Normal  Communication   Communication: No difficulties  Cognition Arousal/Alertness: Awake/alert Behavior During Therapy: WFL for tasks assessed/performed Overall Cognitive Status: Within Functional Limits for tasks assessed                      General Comments      Exercises Total Joint Exercises Ankle Circles/Pumps: AROM;Both;10 reps;Supine Quad Sets: AROM;Both;10 reps;Supine Towel Squeeze: AROM;Both;10 reps;Supine Short Arc Quad: AROM;Left;10 reps;Supine Heel Slides: AROM;Left;10 reps;Supine Hip ABduction/ADduction: AROM;Left;10 reps;Supine Straight Leg Raises: AAROM;Left;10 reps;Supine      Assessment/Plan    PT Assessment Patient needs continued PT services  PT Diagnosis Difficulty walking   PT Problem List Decreased strength;Decreased range of motion;Decreased activity tolerance;Decreased mobility;Decreased knowledge of precautions;Decreased safety awareness;Decreased  knowledge of use of DME;Pain  PT Treatment Interventions DME instruction;Gait training;Functional mobility training;Therapeutic activities;Therapeutic exercise;Patient/family education   PT Goals (Current goals can be found in the Care Plan section) Acute Rehab PT Goals Patient Stated Goal: play tennis PT Goal Formulation: With  patient/family Time For Goal Achievement: 04/18/15 Potential to Achieve Goals: Good    Frequency 7X/week   Barriers to discharge        Co-evaluation               End of Session   Activity Tolerance: Patient tolerated treatment well Patient left: in chair;with call bell/phone within reach;with family/visitor present Nurse Communication: Mobility status         Time: 1100-1130 PT Time Calculation (min) (ACUTE ONLY): 30 min   Charges:   PT Evaluation $Initial PT Evaluation Tier I: 1 Procedure PT Treatments $Gait Training: 8-22 mins   PT G CodesMarcelino Freestone PT [id number]  04/15/2015, 12:53 PM

## 2015-04-15 NOTE — Discharge Instructions (Addendum)
° °Dr. Frank Aluisio °Total Joint Specialist °[facility] °[address] °[place], [place] [address] °[phone] ° °TOTAL KNEE REPLACEMENT POSTOPERATIVE DIRECTIONS ° °Knee Rehabilitation, Guidelines Following Surgery  °Results after knee surgery are often greatly improved when you follow the exercise, range of motion and muscle strengthening exercises prescribed by your doctor. Safety measures are also important to protect the knee from further injury. Any time any of these exercises cause you to have increased pain or swelling in your knee joint, decrease the amount until you are comfortable again and slowly increase them. If you have problems or questions, call your caregiver or physical therapist for advice.  ° °HOME CARE INSTRUCTIONS  °Remove items at home which could result in a fall. This includes throw rugs or furniture in walking pathways.  °· ICE to the affected knee every three hours for 30 minutes at a time and then as needed for pain and swelling.  Continue to use ice on the knee for pain and swelling from surgery. You may notice swelling that will progress down to the foot and ankle.  This is normal after surgery.  Elevate the leg when you are not up walking on it.   °· Continue to use the breathing machine which will help keep your temperature down.  It is common for your temperature to cycle up and down following surgery, especially at night when you are not up moving around and exerting yourself.  The breathing machine keeps your lungs expanded and your temperature down. °· Do not place pillow under knee, focus on keeping the knee straight while resting ° °DIET °You may resume your previous home diet once your are discharged from the hospital. ° °DRESSING / WOUND CARE / SHOWERING °You may shower 3 days after surgery, but keep the wounds dry during showering.  You may use an occlusive plastic wrap (Press'n Seal for example), NO SOAKING/SUBMERGING IN THE BATHTUB.  If the  bandage gets wet, change with a clean dry gauze.  If the incision gets wet, pat the wound dry with a clean towel. °You may start showering once you are discharged home but do not submerge the incision under water. Just pat the incision dry and apply a dry gauze dressing on daily. °Change the surgical dressing daily and reapply a dry dressing each time. ° °ACTIVITY °Walk with your walker as instructed. °Use walker as long as suggested by your caregivers. °Avoid periods of inactivity such as sitting longer than an hour when not asleep. This helps prevent blood clots.  °You may resume a sexual relationship in one month or when given the OK by your doctor.  °You may return to work once you are cleared by your doctor.  °Do not drive a car for 6 weeks or until released by you surgeon.  °Do not drive while taking narcotics. ° °WEIGHT BEARING °Weight bearing as tolerated with assist device (walker, cane, etc) as directed, use it as long as suggested by your surgeon or therapist, typically at least 4-6 weeks. ° °POSTOPERATIVE CONSTIPATION PROTOCOL °Constipation - defined medically as fewer than three stools per week and severe constipation as less than one stool per week. ° °One of the most common issues patients have following surgery is constipation.  Even if you have a regular bowel pattern at home, your normal regimen is likely to be disrupted due to multiple reasons following surgery.  Combination of anesthesia, postoperative narcotics, change in appetite and fluid intake all can affect your bowels.    In order to avoid complications following surgery, here are some recommendations in order to help you during your recovery period. ° °Colace (docusate) - Pick up an over-the-counter form of Colace or another stool softener and take twice a day as long as you are requiring postoperative pain medications.  Take with a full glass of water daily.  If you experience loose stools or diarrhea, hold the colace until you stool forms  back up.  If your symptoms do not get better within 1 week or if they get worse, check with your doctor. ° °Dulcolax (bisacodyl) - Pick up over-the-counter and take as directed by the product packaging as needed to assist with the movement of your bowels.  Take with a full glass of water.  Use this product as needed if not relieved by Colace only.  ° °MiraLax (polyethylene glycol) - Pick up over-the-counter to have on hand.  MiraLax is a solution that will increase the amount of water in your bowels to assist with bowel movements.  Take as directed and can mix with a glass of water, juice, soda, coffee, or tea.  Take if you go more than two days without a movement. °Do not use MiraLax more than once per day. Call your doctor if you are still constipated or irregular after using this medication for 7 days in a row. ° °If you continue to have problems with postoperative constipation, please contact the office for further assistance and recommendations.  If you experience "the worst abdominal pain ever" or develop nausea or vomiting, please contact the office immediatly for further recommendations for treatment. ° °ITCHING ° If you experience itching with your medications, try taking only a single pain pill, or even half a pain pill at a time.  You can also use Benadryl over the counter for itching or also to help with sleep.  ° °TED HOSE STOCKINGS °Wear the elastic stockings on both legs for three weeks following surgery during the day but you may remove then at night for sleeping. ° °MEDICATIONS °See your medication summary on the “After Visit Summary” that the nursing staff will review with you prior to discharge.  You may have some home medications which will be placed on hold until you complete the course of blood thinner medication.  It is important for you to complete the blood thinner medication as prescribed by your surgeon.  Continue your approved medications as instructed at time of  discharge. ° °PRECAUTIONS °If you experience chest pain or shortness of breath - call 911 immediately for transfer to the hospital emergency department.  °If you develop a fever greater that 101 F, purulent drainage from wound, increased redness or drainage from wound, foul odor from the wound/dressing, or calf pain - CONTACT YOUR SURGEON.   °                                                °FOLLOW-UP APPOINTMENTS °Make sure you keep all of your appointments after your operation with your surgeon and caregivers. You should call the office at the above phone number and make an appointment for approximately two weeks after the date of your surgery or on the date instructed by your surgeon outlined in the "After Visit Summary". ° ° °RANGE OF MOTION AND STRENGTHENING EXERCISES  °Rehabilitation of the knee is important following a knee injury or   an operation. After just a few days of immobilization, the muscles of the thigh which control the knee become weakened and shrink (atrophy). Knee exercises are designed to build up the tone and strength of the thigh muscles and to improve knee motion. Often times heat used for twenty to thirty minutes before working out will loosen up your tissues and help with improving the range of motion but do not use heat for the first two weeks following surgery. These exercises can be done on a training (exercise) mat, on the floor, on a table or on a bed. Use what ever works the best and is most comfortable for you Knee exercises include:  °Leg Lifts - While your knee is still immobilized in a splint or cast, you can do straight leg raises. Lift the leg to 60 degrees, hold for 3 sec, and slowly lower the leg. Repeat 10-20 times 2-3 times daily. Perform this exercise against resistance later as your knee gets better.  °Quad and Hamstring Sets - Tighten up the muscle on the front of the thigh (Quad) and hold for 5-10 sec. Repeat this 10-20 times hourly. Hamstring sets are done by pushing the  foot backward against an object and holding for 5-10 sec. Repeat as with quad sets.  °· Leg Slides: Lying on your back, slowly slide your foot toward your buttocks, bending your knee up off the floor (only go as far as is comfortable). Then slowly slide your foot back down until your leg is flat on the floor again. °· Angel Wings: Lying on your back spread your legs to the side as far apart as you can without causing discomfort.  °A rehabilitation program following serious knee injuries can speed recovery and prevent re-injury in the future due to weakened muscles. Contact your doctor or a physical therapist for more information on knee rehabilitation.  ° °IF YOU ARE TRANSFERRED TO A SKILLED REHAB FACILITY °If the patient is transferred to a skilled rehab facility following release from the hospital, a list of the current medications will be sent to the facility for the patient to continue.  When discharged from the skilled rehab facility, please have the facility set up the patient's Home Health Physical Therapy prior to being released. Also, the skilled facility will be responsible for providing the patient with their medications at time of release from the facility to include their pain medication, the muscle relaxants, and their blood thinner medication. If the patient is still at the rehab facility at time of the two week follow up appointment, the skilled rehab facility will also need to assist the patient in arranging follow up appointment in our office and any transportation needs. ° °MAKE SURE YOU:  °Understand these instructions.  °Get help right away if you are not doing well or get worse.  ° ° °Pick up stool softner and laxative for home use following surgery while on pain medications. °Do not submerge incision under water. °Please use good hand washing techniques while changing dressing each day. °May shower starting three days after surgery. °Please use a clean towel to pat the incision dry following  showers. °Continue to use ice for pain and swelling after surgery. °Do not use any lotions or creams on the incision until instructed by your surgeon. ° °Take Xarelto for two and a half more weeks, then discontinue Xarelto. °Once the patient has completed the blood thinner regimen, then take a Baby 81 mg Aspirin daily for three more weeks. ° ° °Information   on my medicine - XARELTO® (Rivaroxaban) ° °This medication education was reviewed with me or my healthcare representative as part of my discharge preparation.  The pharmacist that spoke with me during my hospital stay was:  WOFFORD, DREW A, RPH ° °Why was Xarelto® prescribed for you? °Xarelto® was prescribed for you to reduce the risk of blood clots forming after orthopedic surgery. The medical term for these abnormal blood clots is venous thromboembolism (VTE). ° °What do you need to know about xarelto® ? °Take your Xarelto® ONCE DAILY at the same time every day. °You may take it either with or without food. ° °If you have difficulty swallowing the tablet whole, you may crush it and mix in applesauce just prior to taking your dose. ° °Take Xarelto® exactly as prescribed by your doctor and DO NOT stop taking Xarelto® without talking to the doctor who prescribed the medication.  Stopping without other VTE prevention medication to take the place of Xarelto® may increase your risk of developing a clot. ° °After discharge, you should have regular check-up appointments with your healthcare provider that is prescribing your Xarelto®.   ° °What do you do if you miss a dose? °If you miss a dose, take it as soon as you remember on the same day then continue your regularly scheduled once daily regimen the next day. Do not take two doses of Xarelto® on the same day.  ° °Important Safety Information °A possible side effect of Xarelto® is bleeding. You should call your healthcare provider right away if you experience any of the following: °? Bleeding from an injury or your  nose that does not stop. °? Unusual colored urine (red or dark brown) or unusual colored stools (red or black). °? Unusual bruising for unknown reasons. °? A serious fall or if you hit your head (even if there is no bleeding). ° °Some medicines may interact with Xarelto® and might increase your risk of bleeding while on Xarelto®. To help avoid this, consult your healthcare provider or pharmacist prior to using any new prescription or non-prescription medications, including herbals, vitamins, non-steroidal anti-inflammatory drugs (NSAIDs) and supplements. ° °This website has more information on Xarelto®: www.xarelto.com. ° ° ° °

## 2015-04-15 NOTE — Progress Notes (Signed)
Physical Therapy Treatment Patient Details Name: Jacqueline Myers MRN: [id number] DOB: 07-20-1950 Today's Date: 04/15/2015    History of Present Illness s/p L TKA    PT Comments    Patient reports pain has excalated. RN aware.  Had tylenol prior, pain meds not due. Ice applied. Repositioned.  Follow Up Recommendations  Home health PT;Supervision/Assistance - 24 hour     Equipment Recommendations  Rolling walker with 5" wheels    Recommendations for Other Services       Precautions / Restrictions Precautions Precautions: Knee;Fall Required Braces or Orthoses: Knee Immobilizer - Left Knee Immobilizer - Left: Discontinue once straight leg raise with < 10 degree lag    Mobility  Bed Mobility         Supine to sit: Min assist Sit to supine: Min assist   General bed mobility comments: assist for LLE  Transfers Overall transfer level: Needs assistance Equipment used: Rolling walker (2 wheeled) Transfers: Sit to/from Stand Sit to Stand: Supervision         General transfer comment: manages LLE without the KI  Ambulation/Gait Ambulation/Gait assistance: Min guard Ambulation Distance (Feet):  (x 2) Assistive device: Rolling walker (2 wheeled) Gait Pattern/deviations: Step-to pattern;Antalgic     General Gait Details: cues for sequence, reports pain is too much and wants to return to bed.   Stairs            Wheelchair Mobility    Modified Rankin (Stroke Patients Only)       Balance                                    Cognition Arousal/Alertness: Awake/alert                          Exercises Total Joint Exercises Ankle Circles/Pumps: AROM;Both;10 reps;Supine Quad Sets: AROM;Both;10 reps;Supine Towel Squeeze: AROM;Both;10 reps;Supine Short Arc Quad: AROM;Left;10 reps;Supine Heel Slides: AROM;Left;10 reps;Supine Hip ABduction/ADduction: AROM;Left;10 reps;Supine Straight Leg Raises: AAROM;Left;10 reps;Supine     General Comments        Pertinent Vitals/Pain Pain Score: 7  Pain Location: L knee Pain Descriptors / Indicators: Aching;Discomfort;Grimacing Pain Intervention(s): Limited activity within patient's tolerance;Monitored during session;Premedicated before session;Ice applied    Home Living Family/patient expects to be discharged to:: Private residence       Home Access: Stairs to enter Entrance Stairs-Rails: None Home Layout: One level        Prior Function            PT Goals (current goals can now be found in the care plan section) Acute Rehab PT Goals Patient Stated Goal: play tennis PT Goal Formulation: With patient/family Time For Goal Achievement: 04/18/15 Potential to Achieve Goals: Good Progress towards PT goals: Progressing toward goals    Frequency  7X/week    PT Plan Current plan remains appropriate    Co-evaluation             End of Session   Activity Tolerance: Patient limited by pain Patient left: in bed;with call bell/phone within reach;with family/visitor present     Time: [id number] PT Time Calculation (min) (ACUTE ONLY): 11 min  Charges:  $Gait Training: 8-22 mins                    G Codes:      Claretha Cooper 04/15/2015, 2:41 PM

## 2015-04-15 NOTE — Care Management Note (Signed)
Case Management Note  Patient Details  Name: Jacqueline Myers MRN: [id number] Date of Birth: 08/02/1950  Subjective/Objective:       S/p Left Total Knee Arthroplasty             Action/Plan: Discharge planning, spoke with patient and spouse at bedside. Have chosen [facility]. Contacted [facility] for referral. Needs RW, declines 3-n-1, contacted [facility] to deliver to room  Expected Discharge Date:                  Expected Discharge Plan:  [place]  In-House Referral:  NA  Discharge planning Services  CM Consult  Post Acute Care Choice:  Home Health, Durable Medical Equipment Choice offered to:  Patient  DME Arranged:  Walker rolling DME Agency:  [place] Arranged:  PT [place]:  [place]  Status of Service:  Completed, signed off  Medicare Important Message Given:    Date Medicare IM Given:    Medicare IM give by:    Date Additional Medicare IM Given:    Additional Medicare Important Message give by:     If discussed at [place] of Stay Meetings, dates discussed:    Additional Comments:  Guadalupe Maple, RN 04/15/2015, 11:37 AM

## 2015-04-15 NOTE — Evaluation (Signed)
Occupational Therapy Evaluation Patient Details Name: Jacqueline Myers MRN: [id number] DOB: 31-Aug-1950 Today's Date: 04/15/2015    History of Present Illness s/p L TKA   Clinical Impression   This 64 year old female was admitted for the above surgery. She will benefit from one more OT session to further educate on bathroom transfers. Goals are for supervision level    Follow Up Recommendations  Supervision/Assistance - 24 hour;No OT follow up    Equipment Recommendations  None recommended by OT    Recommendations for Other Services       Precautions / Restrictions Precautions Precautions: Knee;Fall Restrictions Weight Bearing Restrictions: No      Mobility Bed Mobility Overal bed mobility: Needs Assistance Bed Mobility: Supine to Sit;Sit to Supine     Supine to sit: Min assist Sit to supine: Min assist   General bed mobility comments: assist for LLE  Transfers Overall transfer level: Needs assistance Equipment used: Rolling walker (2 wheeled) Transfers: Sit to/from Stand Sit to Stand: Min assist;Min guard         General transfer comment: steadying assistance initially; then min guard from toilet with bil bars for safety    Balance                                            ADL Overall ADL's : Needs assistance/impaired     Grooming: Min guard;Standing;Oral care       Lower Body Bathing: Minimal assistance;Sit to/from stand       Lower Body Dressing: Minimal assistance;Sit to/from stand   Toilet Transfer: Min guard;Ambulation;Comfort height toilet;Grab bars             General ADL Comments: ambulated to bathroom with min guard for safety.  educated on AE; husband will assist as needed     Vision     Perception     Praxis      Pertinent Vitals/Pain Pain Assessment: 0-10 Pain Score: 4  Pain Location: L knee Pain Descriptors / Indicators: Aching Pain Intervention(s): Limited activity within patient's  tolerance;Monitored during session;Premedicated before session;Patient requesting pain meds-RN notified;Ice applied     Hand Dominance     Extremity/Trunk Assessment Upper Extremity Assessment Upper Extremity Assessment: Overall WFL for tasks assessed           Communication Communication Communication: No difficulties   Cognition Arousal/Alertness: Awake/alert Behavior During Therapy: WFL for tasks assessed/performed Overall Cognitive Status: Within Functional Limits for tasks assessed                     General Comments       Exercises       Shoulder Instructions      Home Living Family/patient expects to be discharged to:: Private residence Living Arrangements: Spouse/significant other Available Help at Discharge: Family               Bathroom Shower/Tub: Walk-in Psychologist, prison and probation services: Standard     Home Equipment: Toilet riser;Shower seat - built in (toilet riser has rails)          Prior Functioning/Environment Level of Independence: Independent with assistive device(s)             OT Diagnosis: Generalized weakness   OT Problem List: Decreased strength;Decreased knowledge of use of DME or AE;Pain   OT Treatment/Interventions: Self-care/ADL training;DME and/or AE instruction;Patient/family education  OT Goals(Current goals can be found in the care plan section) Acute Rehab OT Goals Patient Stated Goal: return to being active OT Goal Formulation: With patient Time For Goal Achievement: 04/22/15 Potential to Achieve Goals: Good ADL Goals Pt Will Transfer to Toilet: with supervision;ambulating;bedside commode Pt Will Perform Tub/Shower Transfer: Shower transfer;with supervision;ambulating;rolling walker;shower seat  OT Frequency: Min 2X/week   Barriers to D/C:            Co-evaluation              End of Session    Activity Tolerance: Patient tolerated treatment well Patient left: in bed;with call bell/phone  within reach;with bed alarm set   Time: [id number] OT Time Calculation (min): 18 min Charges:  OT General Charges $OT Visit: 1 Procedure OT Evaluation $Initial OT Evaluation Tier I: 1 Procedure G-Codes:    Wister Hoefle 05/03/15, 9:40 AM  Lesle Chris, OTR/L [phone] May 03, 2015

## 2015-04-15 NOTE — Discharge Summary (Signed)
Physician Discharge Summary   Patient ID: Jacqueline Myers MRN: [id number] DOB/AGE: 05/09/1950 64 y.o.  Admit date: 04/14/2015 Discharge date: 04-16-2015  Primary Diagnosis:  Osteoarthritis Left knee(s)  Admission Diagnoses:  Past Medical History  Diagnosis Date  . Hypertension   . Hyperlipidemia   . Spinal stenosis   . Osteoarthritis   . GERD (gastroesophageal reflux disease)   . Diverticulosis   . Colon polyps     hyperplastic  . Osteoporosis   . History of bronchitis   . Mitral valve prolapse    Discharge Diagnoses:   Principal Problem:   OA (osteoarthritis) of knee Active Problems:   S/P total knee arthroplasty  Estimated body mass index is 25.51 kg/(m^2) as calculated from the following:   Height as of this encounter: _0  (1.6 m).   Weight as of this encounter: 65.318 kg (144 lb).  Procedure:  Procedure(s) (LRB): LEFT TOTAL KNEE ARTHROPLASTY (Left)   Consults: None  HPI: Jacqueline Myers is a 64 y.o. year old female with end stage OA of her left knee with progressively worsening pain and dysfunction. She has constant pain, with activity and at rest and significant functional deficits with difficulties even with ADLs. She has had extensive non-op management including analgesics, injections of cortisone and viscosupplements, and home exercise program, but remains in significant pain with significant dysfunction. Radiographs show bone on bone arthritis medial and patellofemoral. She presents now for left Total Knee Arthroplasty.   Laboratory Data: Admission on 04/14/2015  Component Date Value Ref Range Status  . WBC 04/15/2015 14.9* 4.0 - 10.5 K/uL Final  . RBC 04/15/2015 3.83* 3.87 - 5.11 MIL/uL Final  . Hemoglobin 04/15/2015 11.1* 12.0 - 15.0 g/dL Final  . HCT 04/15/2015 33.2* 36.0 - 46.0 % Final  . MCV 04/15/2015 86.7  78.0 - 100.0 fL Final  . MCH 04/15/2015 29.0  26.0 - 34.0 pg Final  . MCHC 04/15/2015 33.4  30.0 - 36.0 g/dL Final  . RDW  04/15/2015 13.5  11.5 - 15.5 % Final  . Platelets 04/15/2015 223  150 - 400 K/uL Final  . Sodium 04/15/2015 135  135 - 145 mmol/L Final  . Potassium 04/15/2015 4.0  3.5 - 5.1 mmol/L Final  . Chloride 04/15/2015 103  101 - 111 mmol/L Final  . CO2 04/15/2015 26  22 - 32 mmol/L Final  . Glucose, Bld 04/15/2015 130* 65 - 99 mg/dL Final  . BUN 04/15/2015 13  6 - 20 mg/dL Final  . Creatinine, Ser 04/15/2015 0.69  0.44 - 1.00 mg/dL Final  . Calcium 04/15/2015 8.7* 8.9 - 10.3 mg/dL Final  . GFR calc non Af Amer 04/15/2015 >60  >60 mL/min Final  . GFR calc Af Amer 04/15/2015 >60  >60 mL/min Final   Comment: (NOTE) The eGFR has been calculated using the CKD EPI equation. This calculation has not been validated in all clinical situations. eGFR's persistently <60 mL/min signify possible Chronic Kidney Disease.   Georgiann Hahn gap 04/15/2015 6  5 - 15 Final  Hospital Outpatient Visit on 04/08/2015  Component Date Value Ref Range Status  . MRSA, PCR 04/08/2015 NEGATIVE  NEGATIVE Final  . Staphylococcus aureus 04/08/2015 NEGATIVE  NEGATIVE Final   Comment:        The Xpert SA Assay (FDA approved for NASAL specimens in patients over 35 years of age), is one component of a comprehensive surveillance program.  Test performance has been validated by [facility] for patients greater than or equal to 65  year old. It is not intended to diagnose infection nor to guide or monitor treatment.   Marland Kitchen aPTT 04/08/2015 28  24 - 37 seconds Final  . WBC 04/08/2015 10.2  4.0 - 10.5 K/uL Final  . RBC 04/08/2015 4.81  3.87 - 5.11 MIL/uL Final  . Hemoglobin 04/08/2015 14.0  12.0 - 15.0 g/dL Final  . HCT 04/08/2015 42.9  36.0 - 46.0 % Final  . MCV 04/08/2015 89.2  78.0 - 100.0 fL Final  . MCH 04/08/2015 29.1  26.0 - 34.0 pg Final  . MCHC 04/08/2015 32.6  30.0 - 36.0 g/dL Final  . RDW 04/08/2015 13.7  11.5 - 15.5 % Final  . Platelets 04/08/2015 299  150 - 400 K/uL Final  . Sodium 04/08/2015 139  135 - 145 mmol/L  Final  . Potassium 04/08/2015 4.6  3.5 - 5.1 mmol/L Final  . Chloride 04/08/2015 102  101 - 111 mmol/L Final  . CO2 04/08/2015 29  22 - 32 mmol/L Final  . Glucose, Bld 04/08/2015 103* 65 - 99 mg/dL Final  . BUN 04/08/2015 19  6 - 20 mg/dL Final  . Creatinine, Ser 04/08/2015 0.92  0.44 - 1.00 mg/dL Final  . Calcium 04/08/2015 9.6  8.9 - 10.3 mg/dL Final  . Total Protein 04/08/2015 7.5  6.5 - 8.1 g/dL Final  . Albumin 04/08/2015 4.5  3.5 - 5.0 g/dL Final  . AST 04/08/2015 29  15 - 41 U/L Final  . ALT 04/08/2015 22  14 - 54 U/L Final  . Alkaline Phosphatase 04/08/2015 75  38 - 126 U/L Final  . Total Bilirubin 04/08/2015 0.6  0.3 - 1.2 mg/dL Final  . GFR calc non Af Amer 04/08/2015 >60  >60 mL/min Final  . GFR calc Af Amer 04/08/2015 >60  >60 mL/min Final   Comment: (NOTE) The eGFR has been calculated using the CKD EPI equation. This calculation has not been validated in all clinical situations. eGFR's persistently <60 mL/min signify possible Chronic Kidney Disease.   . Anion gap 04/08/2015 8  5 - 15 Final  . Prothrombin Time 04/08/2015 12.7  11.6 - 15.2 seconds Final  . INR 04/08/2015 0.93  0.00 - 1.49 Final  . ABO/RH(D) 04/08/2015 O NEG   Final  . Antibody Screen 04/08/2015 NEG   Final  . Sample Expiration 04/08/2015 04/17/2015   Final  . Extend sample reason 04/08/2015 NO TRANSFUSIONS OR PREGNANCY IN THE PAST 3 MONTHS   Final  . Color, Urine 04/08/2015 YELLOW  YELLOW Final  . APPearance 04/08/2015 CLEAR  CLEAR Final  . Specific Gravity, Urine 04/08/2015 1.024  1.005 - 1.030 Final  . pH 04/08/2015 6.0  5.0 - 8.0 Final  . Glucose, UA 04/08/2015 NEGATIVE  NEGATIVE mg/dL Final  . Hgb urine dipstick 04/08/2015 NEGATIVE  NEGATIVE Final  . Bilirubin Urine 04/08/2015 NEGATIVE  NEGATIVE Final  . Ketones, ur 04/08/2015 NEGATIVE  NEGATIVE mg/dL Final  . Protein, ur 04/08/2015 NEGATIVE  NEGATIVE mg/dL Final  . Nitrite 04/08/2015 NEGATIVE  NEGATIVE Final  . Leukocytes, UA 04/08/2015 NEGATIVE   NEGATIVE Final   MICROSCOPIC NOT DONE ON URINES WITH NEGATIVE PROTEIN, BLOOD, LEUKOCYTES, NITRITE, OR GLUCOSE <1000 mg/dL.  . ABO/RH(D) 04/08/2015 O NEG   Final     X-Rays:No results found.  EKG: Orders placed or performed during the hospital encounter of 04/08/15  . EKG 12-Lead  . EKG 12-Lead     Hospital Course: KINZY WEYERS is a 64 y.o. who was admitted to [facility]. They were brought to the operating room on 04/14/2015 and underwent Procedure(s): LEFT TOTAL KNEE ARTHROPLASTY.  Patient tolerated the procedure well and was later transferred to the recovery room and then to the orthopaedic floor for postoperative care.  They were given PO and IV analgesics for pain control following their surgery.  They were given 24 hours of postoperative antibiotics of  Anti-infectives    Start     Dose/Rate Route Frequency Ordered Stop   04/14/15 1800  ceFAZolin (ANCEF) IVPB 2 g/50 mL premix     2 g 100 mL/hr over 30 Minutes Intravenous Every 6 hours 04/14/15 1544 04/15/15 0113   04/14/15 1032  ceFAZolin (ANCEF) IVPB 2 g/50 mL premix     2 g 100 mL/hr over 30 Minutes Intravenous On call to O.R. 04/14/15 1032 04/14/15 1231     and started on DVT prophylaxis in the form of Xarelto.   PT and OT were ordered for total joint protocol.  Discharge planning consulted to help with postop disposition and equipment needs.  Patient had a good night on the evening of surgery.  They started to get up OOB with therapy on day one. Hemovac drain was pulled without difficulty.  Continued to work with therapy into day two.  Dressing was changed on day two and the incision was healing well.  Patient was seen in rounds and was ready to go home.  Discharge home with home health Diet - Cardiac diet Follow up - in 2 weeks Activity - WBAT Disposition - Home Condition Upon Discharge - Good D/C Meds - See DC Summary DVT Prophylaxis - Xarelto  Discharge Instructions    Call MD / Call 911    Complete  by:  As directed   If you experience chest pain or shortness of breath, CALL 911 and be transported to the hospital emergency room.  If you develope a fever above 101 F, pus (white drainage) or increased drainage or redness at the wound, or calf pain, call your surgeon's office.     Change dressing    Complete by:  As directed   Change dressing daily with sterile 4 x 4 inch gauze dressing and apply TED hose. Do not submerge the incision under water.     Constipation Prevention    Complete by:  As directed   Drink plenty of fluids.  Prune juice may be helpful.  You may use a stool softener, such as Colace (over the counter) 100 mg twice a day.  Use MiraLax (over the counter) for constipation as needed.     Diet general    Complete by:  As directed      Discharge instructions    Complete by:  As directed   Pick up stool softner and laxative for home use following surgery while on pain medications. Do not submerge incision under water. Please use good hand washing techniques while changing dressing each day. May shower starting three days after surgery. Please use a clean towel to pat the incision dry following showers. Continue to use ice for pain and swelling after surgery. Do not use any lotions or creams on the incision until instructed by your surgeon.  Take Xarelto for two and a half more weeks, then discontinue Xarelto. Once the patient has completed the blood thinner regimen, then take a Baby 81 mg Aspirin daily for three more weeks.  Postoperative Constipation Protocol  Constipation - defined medically as fewer than three stools per week and severe constipation as  less than one stool per week.  One of the most common issues patients have following surgery is constipation.  Even if you have a regular bowel pattern at home, your normal regimen is likely to be disrupted due to multiple reasons following surgery.  Combination of anesthesia, postoperative narcotics, change in appetite and  fluid intake all can affect your bowels.  In order to avoid complications following surgery, here are some recommendations in order to help you during your recovery period.  Colace (docusate) - Pick up an over-the-counter form of Colace or another stool softener and take twice a day as long as you are requiring postoperative pain medications.  Take with a full glass of water daily.  If you experience loose stools or diarrhea, hold the colace until you stool forms back up.  If your symptoms do not get better within 1 week or if they get worse, check with your doctor.  Dulcolax (bisacodyl) - Pick up over-the-counter and take as directed by the product packaging as needed to assist with the movement of your bowels.  Take with a full glass of water.  Use this product as needed if not relieved by Colace only.   MiraLax (polyethylene glycol) - Pick up over-the-counter to have on hand.  MiraLax is a solution that will increase the amount of water in your bowels to assist with bowel movements.  Take as directed and can mix with a glass of water, juice, soda, coffee, or tea.  Take if you go more than two days without a movement. Do not use MiraLax more than once per day. Call your doctor if you are still constipated or irregular after using this medication for 7 days in a row.  If you continue to have problems with postoperative constipation, please contact the office for further assistance and recommendations.  If you experience "the worst abdominal pain ever" or develop nausea or vomiting, please contact the office immediatly for further recommendations for treatment.     Do not put a pillow under the knee. Place it under the heel.    Complete by:  As directed      Do not sit on low chairs, stoools or toilet seats, as it may be difficult to get up from low surfaces    Complete by:  As directed      Driving restrictions    Complete by:  As directed   No driving until released by the physician.     Increase  activity slowly as tolerated    Complete by:  As directed      Lifting restrictions    Complete by:  As directed   No lifting until released by the physician.     Patient may shower    Complete by:  As directed   You may shower without a dressing once there is no drainage.  Do not wash over the wound.  If drainage remains, do not shower until drainage stops.     TED hose    Complete by:  As directed   Use stockings (TED hose) for 3 weeks on both leg(s).  You may remove them at night for sleeping.     Weight bearing as tolerated    Complete by:  As directed   Laterality:  left  Extremity:  Lower            Medication List    STOP taking these medications        calcium citrate-vitamin D 315-200 MG-UNIT tablet  Commonly known as:  CITRACAL+D     CENTRUM SILVER ADULT 50+ PO     fish oil-omega-3 fatty acids 1000 MG capsule     GLUCOSAMINE 1500 COMPLEX Caps     naproxen sodium 220 MG tablet  Commonly known as:  ANAPROX      TAKE these medications        cyclobenzaprine 10 MG tablet  Commonly known as:  FLEXERIL  TAKE ONE-HALF TO ONE TABLET BY MOUTH THREE TIMES DAILY AS NEEDED FOR MUSCLE SPASM.     losartan-hydrochlorothiazide 100-25 MG tablet  Commonly known as:  HYZAAR  Take 1 tablet by mouth daily.     methocarbamol 500 MG tablet  Commonly known as:  ROBAXIN  Take 1 tablet (500 mg total) by mouth every 6 (six) hours as needed for muscle spasms.     omeprazole 20 MG capsule  Commonly known as:  PRILOSEC  Take 1 capsule (20 mg total) by mouth daily.     oxyCODONE 5 MG immediate release tablet  Commonly known as:  Oxy IR/ROXICODONE  Take 1-2 tablets (5-10 mg total) by mouth every 3 (three) hours as needed for moderate pain or severe pain.     rivaroxaban 10 MG Tabs tablet  Commonly known as:  XARELTO  Take 1 tablet (10 mg total) by mouth daily with breakfast. Take Xarelto for two and a half more weeks, then discontinue Xarelto. Once the patient has completed the  blood thinner regimen, then take a Baby 81 mg Aspirin daily for three more weeks.     SYSTANE OP  Apply 1 drop to eye daily as needed.     traMADol 50 MG tablet  Commonly known as:  ULTRAM  Take 1-2 tablets (50-100 mg total) by mouth every 6 (six) hours as needed (mild pain).           Follow-up Information    Follow up with [facility].   Why:  physical therapy   Contact information:   [place] [address] [phone]       Follow up with [facility].   Why:  rolling walker   Contact information:   [address] [place] [address] [phone]       Follow up with [name], MD. Schedule an appointment as soon as possible for a visit on 04/30/2015.   Specialty:  Orthopedic Surgery   Why:  Call office at [phone] to setup appointment on Friday 04/30/2015 with Dr. Wynelle Link.   Contact information:   [address] [place] [address] [phone]       Signed: Arlee Muslim, PA-C Orthopaedic Surgery 04/15/2015, 9:01 PM

## 2015-04-15 NOTE — Addendum Note (Signed)
Addendum  created 04/15/15 1400 by Lollie Sails, CRNA   Modules edited: Charges VN

## 2015-04-16 LAB — CBC
HCT: 29.8 % — ABNORMAL LOW (ref 36.0–46.0)
Hemoglobin: 9.8 g/dL — ABNORMAL LOW (ref 12.0–15.0)
MCH: 28.8 pg (ref 26.0–34.0)
MCHC: 32.9 g/dL (ref 30.0–36.0)
MCV: 87.6 fL (ref 78.0–100.0)
PLATELETS: 211 10*3/uL (ref 150–400)
RBC: 3.4 MIL/uL — ABNORMAL LOW (ref 3.87–5.11)
RDW: 13.9 % (ref 11.5–15.5)
WBC: 13.6 10*3/uL — ABNORMAL HIGH (ref 4.0–10.5)

## 2015-04-16 LAB — BASIC METABOLIC PANEL
Anion gap: 8 (ref 5–15)
BUN: 19 mg/dL (ref 6–20)
CHLORIDE: 100 mmol/L — AB (ref 101–111)
CO2: 29 mmol/L (ref 22–32)
Calcium: 8.6 mg/dL — ABNORMAL LOW (ref 8.9–10.3)
Creatinine, Ser: 0.82 mg/dL (ref 0.44–1.00)
GFR calc Af Amer: >60 mL/min (ref >60)
GLUCOSE: 112 mg/dL — AB (ref 65–99)
POTASSIUM: 3.7 mmol/L (ref 3.5–5.1)
Sodium: 137 mmol/L (ref 135–145)

## 2015-04-16 NOTE — Progress Notes (Signed)
Occupational Therapy Treatment Patient Details Name: GORGEOUS NEWLUN MRN: [id number] DOB: Oct 01, 1950 Today's Date: 04/16/2015    History of present illness s/p L TKA   OT comments  Pt has met all goals.  No further OT is needed at this time  Follow Up Recommendations  Supervision/Assistance - 24 hour;No OT follow up    Equipment Recommendations       Recommendations for Other Services      Precautions / Restrictions Precautions Precautions: Knee;Fall Restrictions Weight Bearing Restrictions: No       Mobility Bed Mobility           Sit to supine: Min assist   General bed mobility comments: assist for LLE  Transfers     Transfers: Sit to/from Stand Sit to Stand: Supervision              Balance                                   ADL Overall ADL's : Needs assistance/impaired                                 Tub/ Shower Transfer: Walk-in shower;Supervision/safety;Ambulation     General ADL Comments: pt had just used commode:  supervision for shower transfer--brought walker in to simulate getting to seat as pt has a large shower with bench.  Pt asked about sleeping on side--recommended she use KI to keep knee straight.  Simulated shower transfer (bed) at supervision level      Vision                     Perception     Praxis      Cognition   Behavior During Therapy: [facility] for tasks assessed/performed Overall Cognitive Status: Within Functional Limits for tasks assessed                       Extremity/Trunk Assessment               Exercises     Shoulder Instructions       General Comments      Pertinent Vitals/ Pain       Pain Score: 7  Pain Location: L knee Pain Descriptors / Indicators: Aching Pain Intervention(s): Limited activity within patient's tolerance;Monitored during session;Premedicated before session;Repositioned;Ice applied  Home Living                                          Prior Functioning/Environment              Frequency       Progress Toward Goals  OT Goals(current goals can now be found in the care plan section)  Progress towards OT goals: Goals met/education completed, patient discharged from OT (simulated toilet (bed))     Plan      Co-evaluation                 End of Session CPM Left Knee CPM Left Knee: Off   Activity Tolerance Patient tolerated treatment well;No increased pain   Patient Left in bed;with call bell/phone within reach;with bed alarm set;with family/visitor present   Nurse Communication  Time: 3374-4514 OT Time Calculation (min): 11 min  Charges: OT General Charges $OT Visit: 1 Procedure OT Treatments $Self Care/Home Management : 8-22 mins  Alainah Phang 04/16/2015, 10:12 AM   Lesle Chris, OTR/L [phone] 04/16/2015

## 2015-04-16 NOTE — Progress Notes (Signed)
   Subjective: 2 Days Post-Op Procedure(s) (LRB): LEFT TOTAL KNEE ARTHROPLASTY (Left) Patient reports pain as mild.   Patient seen in rounds with Dr. Wynelle Link. Patient is well, and has had no acute complaints or problems Patient is ready to go home  Objective: Vital signs in last 24 hours: Temp:  [98.1 F (36.7 C)-98.8 F (37.1 C)] 98.5 F (36.9 C) (12/09 0434) Pulse Rate:  [90-98] 98 (12/09 0434) Resp:  [17-18] 17 (12/09 0434) BP: (131-153)/(53-59) 131/58 mmHg (12/09 0434) SpO2:  [93 %-98 %] 93 % (12/09 0434)  Intake/Output from previous day:  Intake/Output Summary (Last 24 hours) at 04/16/15 0909 Last data filed at 04/15/15 1804  Gross per 24 hour  Intake    840 ml  Output   1600 ml  Net   -760 ml    Labs:  Recent Labs  04/15/15 0507 04/16/15 0535  HGB 11.1* 9.8*    Recent Labs  04/15/15 0507 04/16/15 0535  WBC 14.9* 13.6*  RBC 3.83* 3.40*  HCT 33.2* 29.8*  PLT 223 211    Recent Labs  04/15/15 0507 04/16/15 0535  NA 135 137  K 4.0 3.7  CL 103 100*  CO2 26 29  BUN 13 19  CREATININE 0.69 0.82  GLUCOSE 130* 112*  CALCIUM 8.7* 8.6*   No results for input(s): LABPT, INR in the last 72 hours.  EXAM: General - Patient is Alert, Appropriate and Oriented Extremity - Neurovascular intact Sensation intact distally Dorsiflexion/Plantar flexion intact Incision - clean, dry, no drainage Motor Function - intact, moving foot and toes well on exam.   Assessment/Plan: 2 Days Post-Op Procedure(s) (LRB): LEFT TOTAL KNEE ARTHROPLASTY (Left) Procedure(s) (LRB): LEFT TOTAL KNEE ARTHROPLASTY (Left) Past Medical History  Diagnosis Date  . Hypertension   . Hyperlipidemia   . Spinal stenosis   . Osteoarthritis   . GERD (gastroesophageal reflux disease)   . Diverticulosis   . Colon polyps     hyperplastic  . Osteoporosis   . History of bronchitis   . Mitral valve prolapse    Principal Problem:   OA (osteoarthritis) of knee Active Problems:   S/P total  knee arthroplasty  Estimated body mass index is 25.51 kg/(m^2) as calculated from the following:   Height as of this encounter: 5\' 3"  (1.6 m).   Weight as of this encounter: 65.318 kg (144 lb). Up with therapy Discharge home with home health Diet - Cardiac diet Follow up - in 2 weeks Activity - WBAT Disposition - Home Condition Upon Discharge - Good D/C Meds - See DC Summary DVT Prophylaxis - Xarelto  Arlee Muslim, PA-C Orthopaedic Surgery 04/16/2015, 9:09 AM

## 2015-04-16 NOTE — Progress Notes (Signed)
Physical Therapy Treatment Patient Details Name: Jacqueline Myers MRN: [id number] DOB: [birth year] Today's Date: 04/16/2015    History of Present Illness s/p L TKA    PT Comments    Pt reports pain 7/10 however states is has not been much better and willing to mobilize with PT today.  Pt ambulated good distance in hallway and performed LE exercises.  Pt feels ready for d/c home today.  Follow Up Recommendations  Home health PT;Supervision/Assistance - 24 hour     Equipment Recommendations  Rolling walker with 5" wheels    Recommendations for Other Services       Precautions / Restrictions Precautions Precautions: Knee;Fall Required Braces or Orthoses: Knee Immobilizer - Left Knee Immobilizer - Left: Discontinue once straight leg raise with < 10 degree lag Restrictions Weight Bearing Restrictions: No    Mobility  Bed Mobility Overal bed mobility: Needs Assistance Bed Mobility: Supine to Sit     Supine to sit: Supervision Sit to supine: Min assist   General bed mobility comments: assist for LLE  Transfers Overall transfer level: Needs assistance Equipment used: Rolling walker (2 wheeled) Transfers: Sit to/from Stand Sit to Stand: Supervision         General transfer comment: wished to mobilize without KI today so did not use  Ambulation/Gait Ambulation/Gait assistance: Min Dispensing optician (Feet): 160 Feet Assistive device: Rolling walker (2 wheeled) Gait Pattern/deviations: Step-through pattern;Antalgic;Decreased stance time - left     General Gait Details: verbal cues for step length, encouraged shorter stride for pain control   Stairs            Wheelchair Mobility    Modified Rankin (Stroke Patients Only)       Balance                                    Cognition Arousal/Alertness: Awake/alert Behavior During Therapy: WFL for tasks assessed/performed Overall Cognitive Status: Within Functional  Limits for tasks assessed                      Exercises Total Joint Exercises Ankle Circles/Pumps: AROM;Both;10 reps;Supine Quad Sets: AROM;Both;10 reps;Supine Short Arc Quad: Left;10 reps;Supine;AAROM Heel Slides: Left;10 reps;Supine;AAROM Hip ABduction/ADduction: AROM;Left;10 reps;Supine Straight Leg Raises: AAROM;Left;10 reps;Supine Goniometric ROM: L knee flexion 45* AAROM limited by pain    General Comments        Pertinent Vitals/Pain Pain Assessment: 0-10 Pain Score: 7  Pain Location: L knee Pain Descriptors / Indicators: Aching;Sore Pain Intervention(s): Limited activity within patient's tolerance;Monitored during session;Premedicated before session;Repositioned;Ice applied    Home Living                      Prior Function            PT Goals (current goals can now be found in the care plan section) Progress towards PT goals: Progressing toward goals    Frequency  7X/week    PT Plan Current plan remains appropriate    Co-evaluation             End of Session   Activity Tolerance: Patient tolerated treatment well Patient left: in chair;with call bell/phone within reach;with family/visitor present     Time: [id number] PT Time Calculation (min) (ACUTE ONLY): 18 min  Charges:  $Therapeutic Exercise: 8-22 mins  G Codes:      Tyjae Issa,KATHrine E 05/16/15, 1:20 PM Carmelia Bake, PT, DPT 05-16-2015 Pager: [phone]

## 2015-04-17 DIAGNOSIS — Z471 Aftercare following joint replacement surgery: Secondary | ICD-10-CM | POA: Diagnosis not present

## 2015-04-17 DIAGNOSIS — M4800 Spinal stenosis, site unspecified: Secondary | ICD-10-CM | POA: Diagnosis not present

## 2015-04-17 DIAGNOSIS — M810 Age-related osteoporosis without current pathological fracture: Secondary | ICD-10-CM | POA: Diagnosis not present

## 2015-04-17 DIAGNOSIS — M1990 Unspecified osteoarthritis, unspecified site: Secondary | ICD-10-CM | POA: Diagnosis not present

## 2015-04-17 DIAGNOSIS — I10 Essential (primary) hypertension: Secondary | ICD-10-CM | POA: Diagnosis not present

## 2015-04-17 DIAGNOSIS — Z96652 Presence of left artificial knee joint: Secondary | ICD-10-CM | POA: Diagnosis not present

## 2015-04-19 DIAGNOSIS — Z471 Aftercare following joint replacement surgery: Secondary | ICD-10-CM | POA: Diagnosis not present

## 2015-04-19 DIAGNOSIS — M1990 Unspecified osteoarthritis, unspecified site: Secondary | ICD-10-CM | POA: Diagnosis not present

## 2015-04-19 DIAGNOSIS — M810 Age-related osteoporosis without current pathological fracture: Secondary | ICD-10-CM | POA: Diagnosis not present

## 2015-04-19 DIAGNOSIS — Z96652 Presence of left artificial knee joint: Secondary | ICD-10-CM | POA: Diagnosis not present

## 2015-04-19 DIAGNOSIS — I10 Essential (primary) hypertension: Secondary | ICD-10-CM | POA: Diagnosis not present

## 2015-04-19 DIAGNOSIS — M4800 Spinal stenosis, site unspecified: Secondary | ICD-10-CM | POA: Diagnosis not present

## 2015-04-20 DIAGNOSIS — I10 Essential (primary) hypertension: Secondary | ICD-10-CM | POA: Diagnosis not present

## 2015-04-20 DIAGNOSIS — M4800 Spinal stenosis, site unspecified: Secondary | ICD-10-CM | POA: Diagnosis not present

## 2015-04-20 DIAGNOSIS — Z471 Aftercare following joint replacement surgery: Secondary | ICD-10-CM | POA: Diagnosis not present

## 2015-04-20 DIAGNOSIS — M810 Age-related osteoporosis without current pathological fracture: Secondary | ICD-10-CM | POA: Diagnosis not present

## 2015-04-20 DIAGNOSIS — Z96652 Presence of left artificial knee joint: Secondary | ICD-10-CM | POA: Diagnosis not present

## 2015-04-20 DIAGNOSIS — M1990 Unspecified osteoarthritis, unspecified site: Secondary | ICD-10-CM | POA: Diagnosis not present

## 2015-04-21 DIAGNOSIS — Z96652 Presence of left artificial knee joint: Secondary | ICD-10-CM | POA: Diagnosis not present

## 2015-04-21 DIAGNOSIS — M4800 Spinal stenosis, site unspecified: Secondary | ICD-10-CM | POA: Diagnosis not present

## 2015-04-21 DIAGNOSIS — I10 Essential (primary) hypertension: Secondary | ICD-10-CM | POA: Diagnosis not present

## 2015-04-21 DIAGNOSIS — M810 Age-related osteoporosis without current pathological fracture: Secondary | ICD-10-CM | POA: Diagnosis not present

## 2015-04-21 DIAGNOSIS — Z471 Aftercare following joint replacement surgery: Secondary | ICD-10-CM | POA: Diagnosis not present

## 2015-04-21 DIAGNOSIS — M1990 Unspecified osteoarthritis, unspecified site: Secondary | ICD-10-CM | POA: Diagnosis not present

## 2015-04-22 DIAGNOSIS — Z96652 Presence of left artificial knee joint: Secondary | ICD-10-CM | POA: Diagnosis not present

## 2015-04-22 DIAGNOSIS — I10 Essential (primary) hypertension: Secondary | ICD-10-CM | POA: Diagnosis not present

## 2015-04-22 DIAGNOSIS — M4800 Spinal stenosis, site unspecified: Secondary | ICD-10-CM | POA: Diagnosis not present

## 2015-04-22 DIAGNOSIS — M1990 Unspecified osteoarthritis, unspecified site: Secondary | ICD-10-CM | POA: Diagnosis not present

## 2015-04-22 DIAGNOSIS — M810 Age-related osteoporosis without current pathological fracture: Secondary | ICD-10-CM | POA: Diagnosis not present

## 2015-04-22 DIAGNOSIS — Z471 Aftercare following joint replacement surgery: Secondary | ICD-10-CM | POA: Diagnosis not present

## 2015-04-24 ENCOUNTER — Emergency Department (HOSPITAL_COMMUNITY): Payer: Medicare Other

## 2015-04-24 ENCOUNTER — Emergency Department (HOSPITAL_COMMUNITY)
Admission: EM | Admit: 2015-04-24 | Discharge: 2015-04-25 | Disposition: A | Discharge status: Home / Self Care | Payer: Medicare Other | Attending: Emergency Medicine | Admitting: Emergency Medicine

## 2015-04-24 ENCOUNTER — Encounter (HOSPITAL_COMMUNITY): Payer: Self-pay

## 2015-04-24 DIAGNOSIS — Z8709 Personal history of other diseases of the respiratory system: Secondary | ICD-10-CM | POA: Insufficient documentation

## 2015-04-24 DIAGNOSIS — Z87891 Personal history of nicotine dependence: Secondary | ICD-10-CM | POA: Diagnosis not present

## 2015-04-24 DIAGNOSIS — I10 Essential (primary) hypertension: Secondary | ICD-10-CM | POA: Diagnosis not present

## 2015-04-24 DIAGNOSIS — E860 Dehydration: Secondary | ICD-10-CM | POA: Diagnosis not present

## 2015-04-24 DIAGNOSIS — R079 Chest pain, unspecified: Secondary | ICD-10-CM | POA: Insufficient documentation

## 2015-04-24 DIAGNOSIS — E785 Hyperlipidemia, unspecified: Secondary | ICD-10-CM | POA: Diagnosis not present

## 2015-04-24 DIAGNOSIS — R404 Transient alteration of awareness: Secondary | ICD-10-CM | POA: Diagnosis not present

## 2015-04-24 DIAGNOSIS — I341 Nonrheumatic mitral (valve) prolapse: Secondary | ICD-10-CM | POA: Insufficient documentation

## 2015-04-24 DIAGNOSIS — R197 Diarrhea, unspecified: Secondary | ICD-10-CM | POA: Insufficient documentation

## 2015-04-24 DIAGNOSIS — Z86010 Personal history of colonic polyps: Secondary | ICD-10-CM | POA: Diagnosis not present

## 2015-04-24 DIAGNOSIS — K219 Gastro-esophageal reflux disease without esophagitis: Secondary | ICD-10-CM | POA: Insufficient documentation

## 2015-04-24 DIAGNOSIS — E876 Hypokalemia: Secondary | ICD-10-CM | POA: Diagnosis not present

## 2015-04-24 DIAGNOSIS — R110 Nausea: Secondary | ICD-10-CM

## 2015-04-24 DIAGNOSIS — E871 Hypo-osmolality and hyponatremia: Secondary | ICD-10-CM | POA: Insufficient documentation

## 2015-04-24 DIAGNOSIS — R0602 Shortness of breath: Secondary | ICD-10-CM | POA: Diagnosis not present

## 2015-04-24 DIAGNOSIS — R531 Weakness: Secondary | ICD-10-CM | POA: Diagnosis not present

## 2015-04-24 DIAGNOSIS — R6883 Chills (without fever): Secondary | ICD-10-CM | POA: Insufficient documentation

## 2015-04-24 DIAGNOSIS — M1990 Unspecified osteoarthritis, unspecified site: Secondary | ICD-10-CM | POA: Insufficient documentation

## 2015-04-24 DIAGNOSIS — Z79899 Other long term (current) drug therapy: Secondary | ICD-10-CM | POA: Insufficient documentation

## 2015-04-24 DIAGNOSIS — R42 Dizziness and giddiness: Secondary | ICD-10-CM | POA: Diagnosis present

## 2015-04-24 DIAGNOSIS — Z7901 Long term (current) use of anticoagulants: Secondary | ICD-10-CM | POA: Diagnosis not present

## 2015-04-24 LAB — I-STAT TROPONIN, ED
TROPONIN I, POC: 0 ng/mL (ref 0.00–0.08)
TROPONIN I, POC: 0.01 ng/mL (ref 0.00–0.08)
Troponin i, poc: 0.01 ng/mL (ref 0.00–0.08)

## 2015-04-24 LAB — BASIC METABOLIC PANEL
Anion gap: 15 (ref 5–15)
BUN: 16 mg/dL (ref 6–20)
CHLORIDE: 96 mmol/L — AB (ref 101–111)
CO2: 18 mmol/L — ABNORMAL LOW (ref 22–32)
Calcium: 9.1 mg/dL (ref 8.9–10.3)
Creatinine, Ser: 0.81 mg/dL (ref 0.44–1.00)
GFR calc non Af Amer: >60 mL/min (ref >60)
Glucose, Bld: 120 mg/dL — ABNORMAL HIGH (ref 65–99)
POTASSIUM: 2.9 mmol/L — AB (ref 3.5–5.1)
SODIUM: 129 mmol/L — AB (ref 135–145)

## 2015-04-24 LAB — URINALYSIS, ROUTINE W REFLEX MICROSCOPIC
Bilirubin Urine: NEGATIVE
GLUCOSE, UA: NEGATIVE mg/dL
Hgb urine dipstick: NEGATIVE
Ketones, ur: NEGATIVE mg/dL
LEUKOCYTES UA: NEGATIVE
Nitrite: NEGATIVE
PH: 7.5 (ref 5.0–8.0)
Protein, ur: NEGATIVE mg/dL
Specific Gravity, Urine: 1.003 — ABNORMAL LOW (ref 1.005–1.030)

## 2015-04-24 LAB — CBC
HEMATOCRIT: 31.2 % — AB (ref 36.0–46.0)
Hemoglobin: 11 g/dL — ABNORMAL LOW (ref 12.0–15.0)
MCH: 29.3 pg (ref 26.0–34.0)
MCHC: 35.3 g/dL (ref 30.0–36.0)
MCV: 83.2 fL (ref 78.0–100.0)
Platelets: 582 10*3/uL — ABNORMAL HIGH (ref 150–400)
RBC: 3.75 MIL/uL — AB (ref 3.87–5.11)
RDW: 13.5 % (ref 11.5–15.5)
WBC: 15.6 10*3/uL — AB (ref 4.0–10.5)

## 2015-04-24 LAB — D-DIMER, QUANTITATIVE: D-Dimer, Quant: 8.8 ug/mL-FEU — ABNORMAL HIGH (ref 0.00–0.50)

## 2015-04-24 LAB — MAGNESIUM: MAGNESIUM: 1.7 mg/dL (ref 1.7–2.4)

## 2015-04-24 MED ORDER — METHOCARBAMOL 500 MG PO TABS
500.0000 mg | ORAL_TABLET | Freq: Once | ORAL | Status: AC
Start: 2015-04-24 — End: 2015-04-24
  Administered 2015-04-24: 500 mg via ORAL
  Filled 2015-04-24: qty 1

## 2015-04-24 MED ORDER — POTASSIUM CHLORIDE 10 MEQ/100ML IV SOLN
10.0000 meq | INTRAVENOUS | Status: AC
  Administered 2015-04-24 (×3): 10 meq via INTRAVENOUS
  Filled 2015-04-24 (×3): qty 100

## 2015-04-24 MED ORDER — IOHEXOL 350 MG/ML SOLN
100.0000 mL | Freq: Once | INTRAVENOUS | Status: AC | PRN
  Administered 2015-04-24: 100 mL via INTRAVENOUS

## 2015-04-24 MED ORDER — SODIUM CHLORIDE 0.9 % IV BOLUS (SEPSIS)
1000.0000 mL | Freq: Once | INTRAVENOUS | Status: AC
  Administered 2015-04-24: 1000 mL via INTRAVENOUS

## 2015-04-24 MED ORDER — POTASSIUM CHLORIDE CRYS ER 20 MEQ PO TBCR
40.0000 meq | EXTENDED_RELEASE_TABLET | Freq: Once | ORAL | Status: AC
  Administered 2015-04-24: 40 meq via ORAL
  Filled 2015-04-24: qty 2

## 2015-04-24 NOTE — ED Notes (Signed)
Bed: WA06 Expected date:  Expected time:  Means of arrival:  Comments: Hyperventilating, anxiety

## 2015-04-24 NOTE — ED Notes (Signed)
Pt remains on monitor, IV infusing without difficulty.

## 2015-04-24 NOTE — ED Notes (Signed)
Patient transported to CT 

## 2015-04-24 NOTE — ED Notes (Signed)
Pt provided graham crackers & pb.

## 2015-04-24 NOTE — ED Provider Notes (Signed)
CSN: [id number]     Arrival date & time 04/24/15  1541 History   First MD Initiated Contact with Patient 04/24/15 1612     Chief Complaint  Patient presents with  . Dizziness     (Consider location/radiation/quality/duration/timing/severity/associated sxs/prior Treatment) HPI Comments: 64 year old female with a history of hypertension, hyperlipidemia come back pain, recent left knee replacement on December 7 presents with concern for lightheadedness, palpitations, nausea, episodes of chest pain and shortness of breath, chills since having knee replacement. 1 week. Also experiencing anxiety, depression, diarrhea. CP began today, aching pain in center of chest with associated shortness of breaht. No radiation, not exertional.  Patient is a 64 y.o. female presenting with dizziness.  Dizziness Quality:  Lightheadedness Associated symptoms: chest pain, diarrhea, headaches, nausea and shortness of breath     Past Medical History  Diagnosis Date  . Hypertension   . Hyperlipidemia   . Spinal stenosis   . Osteoarthritis   . GERD (gastroesophageal reflux disease)   . Diverticulosis   . Colon polyps     hyperplastic  . Osteoporosis   . History of bronchitis   . Mitral valve prolapse    Past Surgical History  Procedure Laterality Date  . Nm myoview ltd  08/2006    normal for abn ekg  . Tonsillectomy    . Colonoscopy    . Total knee arthroplasty Left 04/14/2015    Procedure: LEFT TOTAL KNEE ARTHROPLASTY;  Surgeon: Gaynelle Arabian, MD;  Location: [facility];  Service: Orthopedics;  Laterality: Left;   Family History  Problem Relation Age of Onset  . Alcohol abuse Mother   . Stroke Mother   . Hypertension Mother   . Diabetes Mother   . Hyperlipidemia Father   . Breast cancer Sister   . Colon cancer Neg Hx    Social History  Substance Use Topics  . Smoking status: Former Smoker -- 1.00 packs/day for 29 years    Types: Cigarettes    Quit date: 05/09/1999  . Smokeless tobacco: Never  Used  . Alcohol Use: 0.0 oz/week    0 Standard drinks or equivalent per week     Comment: OCC- GLASS OF WINE   OB History    No data available     Review of Systems  Constitutional: Positive for chills. Negative for fever.  HENT: Negative for sore throat.   Eyes: Negative for visual disturbance.  Respiratory: Positive for shortness of breath. Negative for cough.   Cardiovascular: Positive for chest pain.  Gastrointestinal: Positive for nausea and diarrhea. Negative for abdominal pain.  Genitourinary: Negative for dysuria and difficulty urinating.  Musculoskeletal: Positive for arthralgias. Negative for back pain and neck pain.  Skin: Negative for rash.  Neurological: Positive for dizziness, light-headedness and headaches. Negative for syncope.      Allergies  Lisinopril; Oxycodone; and Simvastatin  Home Medications   Prior to Admission medications   Medication Sig Start Date End Date Taking? Authorizing Provider  acetaminophen (TYLENOL) 500 MG tablet Take 500-1,000 mg by mouth every 6 (six) hours as needed for moderate pain.   Yes Historical Provider, MD  bisacodyl (DULCOLAX) 5 MG EC tablet Take 15 mg by mouth daily as needed for mild constipation or moderate constipation.   Yes Historical Provider, MD  cyclobenzaprine (FLEXERIL) 10 MG tablet TAKE ONE-HALF TO ONE TABLET BY MOUTH THREE TIMES DAILY AS NEEDED FOR MUSCLE SPASM. Patient taking differently: Take 5-10 mg by mouth at bedtime as needed for muscle spasms. TAKE ONE-HALF TO ONE  TABLET BY MOUTH THREE TIMES DAILY AS NEEDED FOR MUSCLE SPASM. 02/08/15  Yes Burnis Medin, MD  losartan-hydrochlorothiazide (HYZAAR) 100-25 MG per tablet Take 1 tablet by mouth daily. 11/13/14  Yes Burnis Medin, MD  methocarbamol (ROBAXIN) 500 MG tablet Take 1 tablet (500 mg total) by mouth every 6 (six) hours as needed for muscle spasms. 04/15/15  Yes Arlee Muslim, PA-C  Multiple Vitamin (MULTIVITAMIN WITH MINERALS) TABS tablet Take 1 tablet by mouth  daily.   Yes Historical Provider, MD  naproxen sodium (ANAPROX) 220 MG tablet Take 440 mg by mouth 2 (two) times daily as needed (pain).   Yes Historical Provider, MD  Omega-3 Fatty Acids (FISH OIL PO) Take 2 capsules by mouth daily.   Yes Historical Provider, MD  omeprazole (PRILOSEC) 20 MG capsule Take 1 capsule (20 mg total) by mouth daily. 03/09/15  Yes Irene Shipper, MD  ondansetron (ZOFRAN) 4 MG tablet Take 4 mg by mouth every 6 (six) hours as needed for nausea or vomiting.   Yes Historical Provider, MD  Polyethyl Glycol-Propyl Glycol (SYSTANE OP) Apply 1 drop to eye daily as needed (dry eyes).    Yes Historical Provider, MD  rivaroxaban (XARELTO) 10 MG TABS tablet Take 1 tablet (10 mg total) by mouth daily with breakfast. Take Xarelto for two and a half more weeks, then discontinue Xarelto. Once the patient has completed the blood thinner regimen, then take a Baby 81 mg Aspirin daily for three more weeks. 04/15/15  Yes Arlee Muslim, PA-C  traMADol (ULTRAM) 50 MG tablet Take 1-2 tablets (50-100 mg total) by mouth every 6 (six) hours as needed (mild pain). 04/15/15  Yes Arlee Muslim, PA-C  HYDROcodone-acetaminophen (NORCO/VICODIN) 5-325 MG tablet Take 1 tablet by mouth every 4 (four) hours as needed for moderate pain.    Historical Provider, MD  oxyCODONE (OXY IR/ROXICODONE) 5 MG immediate release tablet Take 1-2 tablets (5-10 mg total) by mouth every 3 (three) hours as needed for moderate pain or severe pain. 04/15/15   Arlee Muslim, PA-C  potassium chloride (K-DUR) 10 MEQ tablet Take 2 tablets (20 mEq total) by mouth daily. 04/25/15 04/27/15  Gareth Morgan, MD   BP 134/64 mmHg  Pulse 89  Temp(Src) 98.1 F (36.7 C) (Oral)  Resp 15  SpO2 98% Physical Exam  Constitutional: She is oriented to person, place, and time. She appears well-developed and well-nourished. No distress.  HENT:  Head: Normocephalic and atraumatic.  Eyes: Conjunctivae and EOM are normal.  Neck: Normal range of motion.   Cardiovascular: Normal rate, regular rhythm, normal heart sounds and intact distal pulses.  Exam reveals no gallop and no friction rub.   No murmur heard. Pulmonary/Chest: Effort normal and breath sounds normal. No respiratory distress. She has no wheezes. She has no rales.  Abdominal: Soft. She exhibits no distension. There is no tenderness. There is no guarding.  Musculoskeletal: She exhibits no edema or tenderness.  Neurological: She is alert and oriented to person, place, and time. She has normal strength. No cranial nerve deficit or sensory deficit. GCS eye subscore is 4. GCS verbal subscore is 5. GCS motor subscore is 6.  Skin: Skin is warm and dry. No rash noted. She is not diaphoretic. No erythema.  Left knee incision clean/dry/intact  Nursing note and vitals reviewed.   ED Course  Procedures (including critical care time) Labs Review Labs Reviewed  BASIC METABOLIC PANEL - Abnormal; Notable for the following:    Sodium 129 (*)    Potassium  2.9 (*)    Chloride 96 (*)    CO2 18 (*)    Glucose, Bld 120 (*)    All other components within normal limits  CBC - Abnormal; Notable for the following:    WBC 15.6 (*)    RBC 3.75 (*)    Hemoglobin 11.0 (*)    HCT 31.2 (*)    Platelets 582 (*)    All other components within normal limits  URINALYSIS, ROUTINE W REFLEX MICROSCOPIC (NOT AT [facility]) - Abnormal; Notable for the following:    Specific Gravity, Urine 1.003 (*)    All other components within normal limits  D-DIMER, QUANTITATIVE (NOT AT [facility]) - Abnormal; Notable for the following:    D-Dimer, Quant 8.80 (*)    All other components within normal limits  MAGNESIUM  I-STAT TROPOININ, ED  I-STAT TROPOININ, ED  Randolm Idol, ED    Imaging Review Dg Chest 2 View  04/24/2015  CLINICAL DATA:  64 year old female with acute shortness of breath today. EXAM: CHEST  2 VIEW COMPARISON:  07/03/2014. FINDINGS: The cardiomediastinal silhouette is unremarkable. There is no evidence of  focal airspace disease, pulmonary edema, suspicious pulmonary nodule/mass, pleural effusion, or pneumothorax. No acute bony abnormalities are identified. IMPRESSION: No active cardiopulmonary disease. Electronically Signed   By: Margarette Canada M.D.   On: 04/24/2015 17:28   Ct Angio Chest Pe W/cm &/or Wo Cm  04/24/2015  CLINICAL DATA:  Shortness of breath, hyperventilated. Possible reaction to pain medication. Recent knee replacement. EXAM: CT ANGIOGRAPHY CHEST WITH CONTRAST TECHNIQUE: Multidetector CT imaging of the chest was performed using the standard protocol during bolus administration of intravenous contrast. Multiplanar CT image reconstructions and MIPs were obtained to evaluate the vascular anatomy. CONTRAST:  158mL OMNIPAQUE IOHEXOL 350 MG/ML SOLN COMPARISON:  None. FINDINGS: There is no pulmonary embolism identified within the main, lobar or segmental pulmonary arteries bilaterally. Scattered atherosclerotic changes noted along the walls of the normal-caliber thoracic aorta. No aortic aneurysm or dissection. Heart size is normal. No pericardial effusion. No mass or enlarged lymph nodes seen within the mediastinum or perihilar regions. Emphysematous changes noted within the lung apices bilaterally, moderate in degree. Lungs are clear. No evidence of pneumonia. No pleural effusion. No pneumothorax. Trachea and central bronchi are unremarkable. Limited images of the upper abdomen are unremarkable. Superficial soft tissues are unremarkable. Review of the MIP images confirms the above findings. IMPRESSION: 1. No pulmonary embolism seen. 2. Biapical emphysematous changes, moderate in degree. 3. Lungs are clear. 4. Heart size is normal.  No pericardial effusion. 5. No aortic aneurysm or dissection. Electronically Signed   By: Franki Cabot M.D.   On: 04/24/2015 18:10   I have personally reviewed and evaluated these images and lab results as part of my medical decision-making.   EKG  Interpretation   Date/Time:  Saturday April 24 2015 16:08:58 EST Ventricular Rate:  105 PR Interval:  168 QRS Duration: 89 QT Interval:  393 QTC Calculation: 519 R Axis:   75 Text Interpretation:  Sinus tachycardia Abnormal R-wave progression, early  transition Prolonged QT interval No significant change since last tracing  Confirmed by [facility] MD, Blu Lori ([id number]) on 04/24/2015 4:39:47 PM      MDM   Final diagnoses:  Chest pain  Lightheadedness  Nausea  Diarrhea, unspecified type  Hypokalemia  Hyponatremia  Dehydration   64 year old female with a history of hypertension, hyperlipidemia, GERD, back pain, recent left knee replacement on December 7 presents with concern for lightheadedness, palpitations, nausea,  episodes of chest pain and shortness of breath, chills since having knee replacement.  Regarding chest pain, ddx includes ACS, PE, pericarditis.  EKG evaluated by me without significant changes.  Pt with recent surgery, CP/SOB and elevated ddimer and CT PE study ordered showing no signs of PE. Delta troponin x 3 all negative. Pt appropriate for outpt follow up for CP.  Pt has leukocytosis however no sign of UTI, no pneumonia, no sign of infection at knee site.     BMP significant for hyponatremia 129, hypokalemia 2.9, hypochloremia and bicarb 18.  Pt mild acidosis likely secondary to diarrhea.  Pt reports drinking a lot of water, however not eating much and feel hyponatremia and hypokalemia caused by increased intake of water in setting of losing electrolytes through diarrhea (caused by laxative use/stopping opiates) Recent surgery and opiate use may also contribute to hyponatremia.  Pt given normal saline, IV and po potassium replacement in ED and K rx for next 2 days with recommendation of close PCP follow up, drinking fluids with electrolytes.  Patient with borderline symptomatic moderate hyponatremia, however symptoms significantly imroved with fluids/K and do not feel  admission is indicate at this time.  Discussed reasons to return to ED in detail, and recommendation to follow up closely with surgeon and PCP. Marland Kitchen  Gareth Morgan, MD 04/25/15 1130

## 2015-04-24 NOTE — ED Notes (Signed)
Pt states "since taking the pain meds post knee replacement, have had nausea and lightheadedness.  I don't tolerate meds very well."  Spouse stated "2-3 days ago she actually thought she was dying, called the physician and he changed the oxcodone to hydrocodone but it hasn't made any difference."

## 2015-04-24 NOTE — ED Notes (Signed)
Per EMS, pt. C/o dizzyness, weakness, blurry vision,chills, nausea. S/p left knee surgery on dec 7th.  Recently switched from oxycodone to hydrocodone.

## 2015-04-25 MED ORDER — POTASSIUM CHLORIDE ER 10 MEQ PO TBCR: 20.0000 meq | EXTENDED_RELEASE_TABLET | Freq: Every day | ORAL | Status: DC

## 2015-04-27 DIAGNOSIS — Z96652 Presence of left artificial knee joint: Secondary | ICD-10-CM | POA: Diagnosis not present

## 2015-04-27 DIAGNOSIS — Z471 Aftercare following joint replacement surgery: Secondary | ICD-10-CM | POA: Diagnosis not present

## 2015-04-28 ENCOUNTER — Encounter: Payer: Self-pay | Admitting: Internal Medicine

## 2015-04-28 DIAGNOSIS — I10 Essential (primary) hypertension: Secondary | ICD-10-CM

## 2015-04-28 DIAGNOSIS — M1712 Unilateral primary osteoarthritis, left knee: Secondary | ICD-10-CM | POA: Diagnosis not present

## 2015-04-30 DIAGNOSIS — M1712 Unilateral primary osteoarthritis, left knee: Secondary | ICD-10-CM | POA: Diagnosis not present

## 2015-04-30 NOTE — Telephone Encounter (Signed)
I ordered a BMET for next week

## 2015-05-04 ENCOUNTER — Other Ambulatory Visit (INDEPENDENT_AMBULATORY_CARE_PROVIDER_SITE_OTHER): Payer: Medicare Other

## 2015-05-04 DIAGNOSIS — M1712 Unilateral primary osteoarthritis, left knee: Secondary | ICD-10-CM | POA: Diagnosis not present

## 2015-05-04 DIAGNOSIS — I10 Essential (primary) hypertension: Secondary | ICD-10-CM | POA: Diagnosis not present

## 2015-05-04 LAB — BASIC METABOLIC PANEL
BUN: 17 mg/dL (ref 6–23)
CALCIUM: 9.9 mg/dL (ref 8.4–10.5)
CO2: 30 mEq/L (ref 19–32)
Chloride: 101 mEq/L (ref 96–112)
Creatinine, Ser: 0.81 mg/dL (ref 0.40–1.20)
GFR: 75.32 mL/min (ref >60.00)
GLUCOSE: 110 mg/dL — AB (ref 70–99)
Potassium: 4.2 mEq/L (ref 3.5–5.1)
Sodium: 140 mEq/L (ref 135–145)

## 2015-05-05 ENCOUNTER — Other Ambulatory Visit: Payer: Self-pay | Admitting: Internal Medicine

## 2015-05-05 NOTE — Telephone Encounter (Signed)
Sent to the pharmacy by e-scribe.  Has an upcoming appt on 06/28/15

## 2015-05-06 DIAGNOSIS — M1712 Unilateral primary osteoarthritis, left knee: Secondary | ICD-10-CM | POA: Diagnosis not present

## 2015-05-11 DIAGNOSIS — M1712 Unilateral primary osteoarthritis, left knee: Secondary | ICD-10-CM | POA: Diagnosis not present

## 2015-05-13 DIAGNOSIS — M1712 Unilateral primary osteoarthritis, left knee: Secondary | ICD-10-CM | POA: Diagnosis not present

## 2015-05-17 DIAGNOSIS — M1712 Unilateral primary osteoarthritis, left knee: Secondary | ICD-10-CM | POA: Diagnosis not present

## 2015-05-18 DIAGNOSIS — Z471 Aftercare following joint replacement surgery: Secondary | ICD-10-CM | POA: Diagnosis not present

## 2015-05-18 DIAGNOSIS — Z96652 Presence of left artificial knee joint: Secondary | ICD-10-CM | POA: Diagnosis not present

## 2015-05-19 DIAGNOSIS — M1712 Unilateral primary osteoarthritis, left knee: Secondary | ICD-10-CM | POA: Diagnosis not present

## 2015-05-21 DIAGNOSIS — M1712 Unilateral primary osteoarthritis, left knee: Secondary | ICD-10-CM | POA: Diagnosis not present

## 2015-05-24 DIAGNOSIS — M1712 Unilateral primary osteoarthritis, left knee: Secondary | ICD-10-CM | POA: Diagnosis not present

## 2015-05-28 DIAGNOSIS — M1712 Unilateral primary osteoarthritis, left knee: Secondary | ICD-10-CM | POA: Diagnosis not present

## 2015-05-31 ENCOUNTER — Encounter: Payer: Self-pay | Admitting: Internal Medicine

## 2015-05-31 ENCOUNTER — Ambulatory Visit (INDEPENDENT_AMBULATORY_CARE_PROVIDER_SITE_OTHER): Payer: Medicare Other | Admitting: Internal Medicine

## 2015-05-31 VITALS — BP 126/62 | HR 70 | Ht 63.0 in | Wt 139.0 lb

## 2015-05-31 DIAGNOSIS — K2100 Gastro-esophageal reflux disease with esophagitis, without bleeding: Secondary | ICD-10-CM

## 2015-05-31 DIAGNOSIS — R1310 Dysphagia, unspecified: Secondary | ICD-10-CM | POA: Diagnosis not present

## 2015-05-31 DIAGNOSIS — K222 Esophageal obstruction: Secondary | ICD-10-CM

## 2015-05-31 DIAGNOSIS — M1712 Unilateral primary osteoarthritis, left knee: Secondary | ICD-10-CM | POA: Diagnosis not present

## 2015-05-31 NOTE — Patient Instructions (Signed)
Please follow up in one year 

## 2015-05-31 NOTE — Progress Notes (Signed)
HISTORY OF PRESENT ILLNESS:  Jacqueline Myers is a 65 y.o. female who was evaluated in the office 03/08/2015 for GERD, dysphagia, and hoarseness. She subsequently underwent upper endoscopy 03/09/2015. She was found to have distal esophageal stricture with associated esophagitis. Maloney dilation to 97 [place] performed. She was then prescribed omeprazole 20 mg daily. She presents today for follow-up. In the interim, she completed successfully surgery. On medication post dilation she is pleased to report Sling no reflux symptoms. No recurrent problems with dysphagia. She continues to have throat clearing behavior, though this has also improved. No hoarseness. No appreciable medication side effect.  REVIEW OF SYSTEMS:  All non-GI ROS negative except for arthritis, back pain, sleeping problems  Past Medical History  Diagnosis Date  . Hypertension   . Hyperlipidemia   . Spinal stenosis   . Osteoarthritis   . GERD (gastroesophageal reflux disease)   . Diverticulosis   . Colon polyps     hyperplastic  . Osteoporosis   . History of bronchitis   . Mitral valve prolapse   . Esophageal stricture     Past Surgical History  Procedure Laterality Date  . Nm myoview ltd  08/2006    normal for abn ekg  . Tonsillectomy    . Colonoscopy    . Total knee arthroplasty Left 04/14/2015    Procedure: LEFT TOTAL KNEE ARTHROPLASTY;  Surgeon: Gaynelle Arabian, MD;  Location: [facility];  Service: Orthopedics;  Laterality: Left;    Social History Jacqueline Myers  reports that she quit smoking about 16 years ago. Her smoking use included Cigarettes. She has a 29 pack-year smoking history. She has never used smokeless tobacco. She reports that she drinks alcohol. She reports that she does not use illicit drugs.  family history includes Alcohol abuse in her mother; Breast cancer in her sister; Diabetes in her mother; Hyperlipidemia in her father; Hypertension in her mother; Stroke in her mother. There is no  history of Colon cancer.  Allergies  Allergen Reactions  . Lisinopril Cough    Tolerates losartan  . Oxycodone Nausea Only and Palpitations    Change in mental status, camps, anxiety, chills  . Simvastatin Other (See Comments)    Myalgias       PHYSICAL EXAMINATION: Vital signs: BP 126/62 mmHg  Pulse 70  Ht 5\' 3"  (1.6 m)  Wt 139 lb (63.05 kg)  BMI 24.63 kg/m2 General: Well-developed, well-nourished, no acute distress HEENT: Sclerae are anicteric, conjunctiva pink. Oral mucosa intact Lungs: Clear Heart: Regular Abdomen: soft, nontender, nondistended, no obvious ascites, no peritoneal signs, normal bowel sounds. No organomegaly. Extremities: No clubbing cyanosis or edema Psychiatric: alert and oriented x3. Cooperative   ASSESSMENT:  #1. GERD, complicated by esophagitis and peptic stricture. Currently asymptomatic post dilation on PPI #2. Non-adenomatous colon polyps on colonoscopy 2014   PLAN:  #1. Reflux precautions #2. Continue PPI #3. Routine GI office follow-up 1 year. Sooner if needed for breakthrough symptoms or recurrent dysphagia #4. Colonoscopy 2024 #5. Ongoing general medical care with Dr. Regis Bill  25 minutes spent face-to-face with the patient. Greater than 50% of the time use for counseling regarding chronic GERD, peptic stricture, and management. We also discussed potential issues related to chronic PPI use

## 2015-06-02 DIAGNOSIS — M1712 Unilateral primary osteoarthritis, left knee: Secondary | ICD-10-CM | POA: Diagnosis not present

## 2015-06-07 DIAGNOSIS — M1712 Unilateral primary osteoarthritis, left knee: Secondary | ICD-10-CM | POA: Diagnosis not present

## 2015-06-09 DIAGNOSIS — M1712 Unilateral primary osteoarthritis, left knee: Secondary | ICD-10-CM | POA: Diagnosis not present

## 2015-06-21 ENCOUNTER — Other Ambulatory Visit (INDEPENDENT_AMBULATORY_CARE_PROVIDER_SITE_OTHER): Payer: Medicare Other

## 2015-06-21 DIAGNOSIS — Z0000 Encounter for general adult medical examination without abnormal findings: Secondary | ICD-10-CM

## 2015-06-21 DIAGNOSIS — E785 Hyperlipidemia, unspecified: Secondary | ICD-10-CM | POA: Diagnosis not present

## 2015-06-21 DIAGNOSIS — H2513 Age-related nuclear cataract, bilateral: Secondary | ICD-10-CM | POA: Diagnosis not present

## 2015-06-21 LAB — CBC WITH DIFFERENTIAL/PLATELET
BASOS ABS: 0 10*3/uL (ref 0.0–0.1)
BASOS PCT: 0.5 % (ref 0.0–3.0)
EOS ABS: 0.4 10*3/uL (ref 0.0–0.7)
Eosinophils Relative: 4.4 % (ref 0.0–5.0)
HCT: 42.7 % (ref 36.0–46.0)
Hemoglobin: 13.9 g/dL (ref 12.0–15.0)
LYMPHS ABS: 2.5 10*3/uL (ref 0.7–4.0)
Lymphocytes Relative: 29.6 % (ref 12.0–46.0)
MCHC: 32.5 g/dL (ref 30.0–36.0)
MCV: 88.6 fl (ref 78.0–100.0)
MONO ABS: 0.6 10*3/uL (ref 0.1–1.0)
Monocytes Relative: 6.9 % (ref 3.0–12.0)
NEUTROS ABS: 4.9 10*3/uL (ref 1.4–7.7)
NEUTROS PCT: 58.6 % (ref 43.0–77.0)
PLATELETS: 291 10*3/uL (ref 150.0–400.0)
RBC: 4.83 Mil/uL (ref 3.87–5.11)
RDW: 14.7 % (ref 11.5–15.5)
WBC: 8.4 10*3/uL (ref 4.0–10.5)

## 2015-06-21 LAB — HEPATIC FUNCTION PANEL
ALBUMIN: 4.5 g/dL (ref 3.5–5.2)
ALK PHOS: 84 U/L (ref 39–117)
ALT: 21 U/L (ref 0–35)
AST: 21 U/L (ref 0–37)
BILIRUBIN DIRECT: 0.1 mg/dL (ref 0.0–0.3)
TOTAL PROTEIN: 7.1 g/dL (ref 6.0–8.3)
Total Bilirubin: 0.3 mg/dL (ref 0.2–1.2)

## 2015-06-21 LAB — BASIC METABOLIC PANEL
BUN: 21 mg/dL (ref 6–23)
CALCIUM: 9.6 mg/dL (ref 8.4–10.5)
CHLORIDE: 105 meq/L (ref 96–112)
CO2: 29 mEq/L (ref 19–32)
CREATININE: 0.74 mg/dL (ref 0.40–1.20)
GFR: 83.57 mL/min (ref >60.00)
GLUCOSE: 100 mg/dL — AB (ref 70–99)
Potassium: 4.2 mEq/L (ref 3.5–5.1)
Sodium: 142 mEq/L (ref 135–145)

## 2015-06-21 LAB — TSH: TSH: 3.87 u[IU]/mL (ref 0.35–4.50)

## 2015-06-21 LAB — LIPID PANEL
CHOL/HDL RATIO: 4
CHOLESTEROL: 228 mg/dL — AB (ref 0–200)
HDL: 58 mg/dL (ref >39.00)
LDL CALC: 144 mg/dL — AB (ref 0–99)
NonHDL: 169.96
TRIGLYCERIDES: 130 mg/dL (ref 0.0–149.0)
VLDL: 26 mg/dL (ref 0.0–40.0)

## 2015-06-25 DIAGNOSIS — Z471 Aftercare following joint replacement surgery: Secondary | ICD-10-CM | POA: Diagnosis not present

## 2015-06-25 DIAGNOSIS — Z96652 Presence of left artificial knee joint: Secondary | ICD-10-CM | POA: Diagnosis not present

## 2015-06-28 ENCOUNTER — Ambulatory Visit (INDEPENDENT_AMBULATORY_CARE_PROVIDER_SITE_OTHER): Payer: Medicare Other | Admitting: Internal Medicine

## 2015-06-28 ENCOUNTER — Encounter: Payer: Self-pay | Admitting: Internal Medicine

## 2015-06-28 VITALS — BP 132/78 | Temp 98.5°F | Ht 63.0 in | Wt 142.9 lb

## 2015-06-28 DIAGNOSIS — E785 Hyperlipidemia, unspecified: Secondary | ICD-10-CM

## 2015-06-28 DIAGNOSIS — I10 Essential (primary) hypertension: Secondary | ICD-10-CM

## 2015-06-28 DIAGNOSIS — Z23 Encounter for immunization: Secondary | ICD-10-CM

## 2015-06-28 DIAGNOSIS — G479 Sleep disorder, unspecified: Secondary | ICD-10-CM | POA: Diagnosis not present

## 2015-06-28 DIAGNOSIS — M170 Bilateral primary osteoarthritis of knee: Secondary | ICD-10-CM

## 2015-06-28 DIAGNOSIS — Z0000 Encounter for general adult medical examination without abnormal findings: Secondary | ICD-10-CM | POA: Diagnosis not present

## 2015-06-28 DIAGNOSIS — Z79899 Other long term (current) drug therapy: Secondary | ICD-10-CM | POA: Diagnosis not present

## 2015-06-28 NOTE — Patient Instructions (Addendum)
Decreasing   Med for sleep .  Look into  Flexeril as needed.  But sleep hygiene   Goal to not use  Medication  On a regular basis .  Risk benefit   Of asa . TBD    Health Maintenance, Female Adopting a healthy lifestyle and getting preventive care can go a long way to promote health and wellness. Talk with your health care provider about what schedule of regular examinations is right for you. This is a good chance for you to check in with your provider about disease prevention and staying healthy. In between checkups, there are plenty of things you can do on your own. Experts have done a lot of research about which lifestyle changes and preventive measures are most likely to keep you healthy. Ask your health care provider for more information. WEIGHT AND DIET  Eat a healthy diet  Be sure to include plenty of vegetables, fruits, low-fat dairy products, and lean protein.  Do not eat a lot of foods high in solid fats, added sugars, or salt.  Get regular exercise. This is one of the most important things you can do for your health.  Most adults should exercise for at least 150 minutes each week. The exercise should increase your heart rate and make you sweat (moderate-intensity exercise).  Most adults should also do strengthening exercises at least twice a week. This is in addition to the moderate-intensity exercise.  Maintain a healthy weight  Body mass index (BMI) is a measurement that can be used to identify possible weight problems. It estimates body fat based on height and weight. Your health care provider can help determine your BMI and help you achieve or maintain a healthy weight.  For females 55 years of age and older:   A BMI below 18.5 is considered underweight.  A BMI of 18.5 to 24.9 is normal.  A BMI of 25 to 29.9 is considered overweight.  A BMI of 30 and above is considered obese.  Watch levels of cholesterol and blood lipids  You should start having your blood  tested for lipids and cholesterol at 66 years of age, then have this test every 5 years.  You may need to have your cholesterol levels checked more often if:  Your lipid or cholesterol levels are high.  You are older than 66 years of age.  You are at high risk for heart disease.  CANCER SCREENING   Lung Cancer  Lung cancer screening is recommended for adults 43-59 years old who are at high risk for lung cancer because of a history of smoking.  A yearly low-dose CT scan of the lungs is recommended for people who:  Currently smoke.  Have quit within the past 15 years.  Have at least a 30-pack-year history of smoking. A pack year is smoking an average of one pack of cigarettes a day for 1 year.  Yearly screening should continue until it has been 15 years since you quit.  Yearly screening should stop if you develop a health problem that would prevent you from having lung cancer treatment.  Breast Cancer  Practice breast self-awareness. This means understanding how your breasts normally appear and feel.  It also means doing regular breast self-exams. Let your health care provider know about any changes, no matter how small.  If you are in your 20s or 30s, you should have a clinical breast exam (CBE) by a health care provider every 1-3 years as part of a regular health  exam.  If you are 40 or older, have a CBE every year. Also consider having a breast X-ray (mammogram) every year.  If you have a family history of breast cancer, talk to your health care provider about genetic screening.  If you are at high risk for breast cancer, talk to your health care provider about having an MRI and a mammogram every year.  Breast cancer gene (BRCA) assessment is recommended for women who have family members with BRCA-related cancers. BRCA-related cancers include:  Breast.  Ovarian.  Tubal.  Peritoneal cancers.  Results of the assessment will determine the need for genetic counseling  and BRCA1 and BRCA2 testing. Cervical Cancer Your health care provider may recommend that you be screened regularly for cancer of the pelvic organs (ovaries, uterus, and vagina). This screening involves a pelvic examination, including checking for microscopic changes to the surface of your cervix (Pap test). You may be encouraged to have this screening done every 3 years, beginning at age 21.  For women ages 47-65, health care providers may recommend pelvic exams and Pap testing every 3 years, or they may recommend the Pap and pelvic exam, combined with testing for human papilloma virus (HPV), every 5 years. Some types of HPV increase your risk of cervical cancer. Testing for HPV may also be done on women of any age with unclear Pap test results.  Other health care providers may not recommend any screening for nonpregnant women who are considered low risk for pelvic cancer and who do not have symptoms. Ask your health care provider if a screening pelvic exam is right for you.  If you have had past treatment for cervical cancer or a condition that could lead to cancer, you need Pap tests and screening for cancer for at least 20 years after your treatment. If Pap tests have been discontinued, your risk factors (such as having a new sexual partner) need to be reassessed to determine if screening should resume. Some women have medical problems that increase the chance of getting cervical cancer. In these cases, your health care provider may recommend more frequent screening and Pap tests. Colorectal Cancer  This type of cancer can be detected and often prevented.  Routine colorectal cancer screening usually begins at 66 years of age and continues through 66 years of age.  Your health care provider may recommend screening at an earlier age if you have risk factors for colon cancer.  Your health care provider may also recommend using home test kits to check for hidden blood in the stool.  A small camera  at the end of a tube can be used to examine your colon directly (sigmoidoscopy or colonoscopy). This is done to check for the earliest forms of colorectal cancer.  Routine screening usually begins at age 4.  Direct examination of the colon should be repeated every 5-10 years through 66 years of age. However, you may need to be screened more often if early forms of precancerous polyps or small growths are found. Skin Cancer  Check your skin from head to toe regularly.  Tell your health care provider about any new moles or changes in moles, especially if there is a change in a mole's shape or color.  Also tell your health care provider if you have a mole that is larger than the size of a pencil eraser.  Always use sunscreen. Apply sunscreen liberally and repeatedly throughout the day.  Protect yourself by wearing long sleeves, pants, a wide-brimmed hat, and sunglasses  whenever you are outside. HEART DISEASE, DIABETES, AND HIGH BLOOD PRESSURE   High blood pressure causes heart disease and increases the risk of stroke. High blood pressure is more likely to develop in:  People who have blood pressure in the high end of the normal range (130-139/85-89 mm Hg).  People who are overweight or obese.  People who are African American.  If you are 46-97 years of age, have your blood pressure checked every 3-5 years. If you are 10 years of age or older, have your blood pressure checked every year. You should have your blood pressure measured twice--once when you are at a hospital or clinic, and once when you are not at a hospital or clinic. Record the average of the two measurements. To check your blood pressure when you are not at a hospital or clinic, you can use:  An automated blood pressure machine at a pharmacy.  A home blood pressure monitor.  If you are between 40 years and 107 years old, ask your health care provider if you should take aspirin to prevent strokes.  Have regular diabetes  screenings. This involves taking a blood sample to check your fasting blood sugar level.  If you are at a normal weight and have a low risk for diabetes, have this test once every three years after 66 years of age.  If you are overweight and have a high risk for diabetes, consider being tested at a younger age or more often. PREVENTING INFECTION  Hepatitis B  If you have a higher risk for hepatitis B, you should be screened for this virus. You are considered at high risk for hepatitis B if:  You were born in a country where hepatitis B is common. Ask your health care provider which countries are considered high risk.  Your parents were born in a high-risk country, and you have not been immunized against hepatitis B (hepatitis B vaccine).  You have HIV or AIDS.  You use needles to inject street drugs.  You live with someone who has hepatitis B.  You have had sex with someone who has hepatitis B.  You get hemodialysis treatment.  You take certain medicines for conditions, including cancer, organ transplantation, and autoimmune conditions. Hepatitis C  Blood testing is recommended for:  Everyone born from [birth year] through 1966.  Anyone with known risk factors for hepatitis C. Sexually transmitted infections (STIs)  You should be screened for sexually transmitted infections (STIs) including gonorrhea and chlamydia if:  You are sexually active and are younger than 66 years of age.  You are older than 66 years of age and your health care provider tells you that you are at risk for this type of infection.  Your sexual activity has changed since you were last screened and you are at an increased risk for chlamydia or gonorrhea. Ask your health care provider if you are at risk.  If you do not have HIV, but are at risk, it may be recommended that you take a prescription medicine daily to prevent HIV infection. This is called pre-exposure prophylaxis (PrEP). You are considered at risk  if:  You are sexually active and do not regularly use condoms or know the HIV status of your partner(s).  You take drugs by injection.  You are sexually active with a partner who has HIV. Talk with your health care provider about whether you are at high risk of being infected with HIV. If you choose to begin PrEP, you should first be  tested for HIV. You should then be tested every 3 months for as long as you are taking PrEP.  PREGNANCY   If you are premenopausal and you may become pregnant, ask your health care provider about preconception counseling.  If you may become pregnant, take 400 to 800 micrograms (mcg) of folic acid every day.  If you want to prevent pregnancy, talk to your health care provider about birth control (contraception). OSTEOPOROSIS AND MENOPAUSE   Osteoporosis is a disease in which the bones lose minerals and strength with aging. This can result in serious bone fractures. Your risk for osteoporosis can be identified using a bone density scan.  If you are 22 years of age or older, or if you are at risk for osteoporosis and fractures, ask your health care provider if you should be screened.  Ask your health care provider whether you should take a calcium or vitamin D supplement to lower your risk for osteoporosis.  Menopause may have certain physical symptoms and risks.  Hormone replacement therapy may reduce some of these symptoms and risks. Talk to your health care provider about whether hormone replacement therapy is right for you.  HOME CARE INSTRUCTIONS   Schedule regular health, dental, and eye exams.  Stay current with your immunizations.   Do not use any tobacco products including cigarettes, chewing tobacco, or electronic cigarettes.  If you are pregnant, do not drink alcohol.  If you are breastfeeding, limit how much and how often you drink alcohol.  Limit alcohol intake to no more than 1 drink per day for nonpregnant women. One drink equals 12  ounces of beer, 5 ounces of wine, or 1 ounces of hard liquor.  Do not use street drugs.  Do not share needles.  Ask your health care provider for help if you need support or information about quitting drugs.  Tell your health care provider if you often feel depressed.  Tell your health care provider if you have ever been abused or do not feel safe at home.   This information is not intended to replace advice given to you by your health care provider. Make sure you discuss any questions you have with your health care provider.   Document Released: 11/07/2010 Document Revised: 05/15/2014 Document Reviewed: 03/26/2013 Elsevier Interactive Patient Education 2016 [employer]    Insomnia Insomnia is a sleep disorder that makes it difficult to fall asleep or to stay asleep. Insomnia can cause tiredness (fatigue), low energy, difficulty concentrating, mood swings, and poor performance at work or school.  There are three different ways to classify insomnia:  Difficulty falling asleep.  Difficulty staying asleep.  Waking up too early in the morning. Any type of insomnia can be long-term (chronic) or short-term (acute). Both are common. Short-term insomnia usually lasts for three months or less. Chronic insomnia occurs at least three times a week for longer than three months. CAUSES  Insomnia may be caused by another condition, situation, or substance, such as:  Anxiety.  Certain medicines.  Gastroesophageal reflux disease (GERD) or other gastrointestinal conditions.  Asthma or other breathing conditions.  Restless legs syndrome, sleep apnea, or other sleep disorders.  Chronic pain.  Menopause. This may include hot flashes.  Stroke.  Abuse of alcohol, tobacco, or illegal drugs.  Depression.  Caffeine.   Neurological disorders, such as Alzheimer disease.  An overactive thyroid (hyperthyroidism). The cause of insomnia may not be known. RISK FACTORS Risk factors for  insomnia include:  Gender. Women are more commonly  affected than men.  Age. Insomnia is more common as you get older.  Stress. This may involve your professional or personal life.  Income. Insomnia is more common in people with lower income.  Lack of exercise.   Irregular work schedule or night shifts.  Traveling between different time zones. SIGNS AND SYMPTOMS If you have insomnia, trouble falling asleep or trouble staying asleep is the main symptom. This may lead to other symptoms, such as:  Feeling fatigued.  Feeling nervous about going to sleep.  Not feeling rested in the morning.  Having trouble concentrating.  Feeling irritable, anxious, or depressed. TREATMENT  Treatment for insomnia depends on the cause. If your insomnia is caused by an underlying condition, treatment will focus on addressing the condition. Treatment may also include:   Medicines to help you sleep.  Counseling or therapy.  Lifestyle adjustments. HOME CARE INSTRUCTIONS   Take medicines only as directed by your health care provider.  Keep regular sleeping and waking hours. Avoid naps.  Keep a sleep diary to help you and your health care provider figure out what could be causing your insomnia. Include:   When you sleep.  When you wake up during the night.  How well you sleep.   How rested you feel the next day.  Any side effects of medicines you are taking.  What you eat and drink.   Make your bedroom a comfortable place where it is easy to fall asleep:  Put up shades or special blackout curtains to block light from outside.  Use a white noise machine to block noise.  Keep the temperature cool.   Exercise regularly as directed by your health care provider. Avoid exercising right before bedtime.  Use relaxation techniques to manage stress. Ask your health care provider to suggest some techniques that may work well for you. These may include:  Breathing exercises.  Routines  to release muscle tension.  Visualizing peaceful scenes.  Cut back on alcohol, caffeinated beverages, and cigarettes, especially close to bedtime. These can disrupt your sleep.  Do not overeat or eat spicy foods right before bedtime. This can lead to digestive discomfort that can make it hard for you to sleep.  Limit screen use before bedtime. This includes:  Watching TV.  Using your smartphone, tablet, and computer.  Stick to a routine. This can help you fall asleep faster. Try to do a quiet activity, brush your teeth, and go to bed at the same time each night.  Get out of bed if you are still awake after 15 minutes of trying to sleep. Keep the lights down, but try reading or doing a quiet activity. When you feel sleepy, go back to bed.  Make sure that you drive carefully. Avoid driving if you feel very sleepy.  Keep all follow-up appointments as directed by your health care provider. This is important. SEEK MEDICAL CARE IF:   You are tired throughout the day or have trouble in your daily routine due to sleepiness.  You continue to have sleep problems or your sleep problems get worse. SEEK IMMEDIATE MEDICAL CARE IF:   You have serious thoughts about hurting yourself or someone else.   This information is not intended to replace advice given to you by your health care provider. Make sure you discuss any questions you have with your health care provider.   Document Released: 04/21/2000 Document Revised: 01/13/2015 Document Reviewed: 01/23/2014 [employer]vier Interactive Patient Education [employer].

## 2015-06-28 NOTE — Progress Notes (Signed)
Chief Complaint  Patient presents with  . Medicare Wellness    had knee replacement  . Hypertension    HPI: Jacqueline Myers 65 y.o. comes in today for Preventive Medicare wellness visit  And med disease management .Since last visit.had tkr doing much better   Increasing activity  Still stress with caretaking mom . HT seems controlled most days  On medication Had cataract surgery  Had episode of dizziness felt from non cv causes ? Hydration   Health Maintenance  Topic Date Due  . Hepatitis C Screening  [birth year]  . HIV Screening  11/[birth year]/1964  . PAP SMEAR  06/29/2015  . INFLUENZA VACCINE  12/07/2015  . MAMMOGRAM  04/23/2016  . PNA vac Low Risk Adult (2 of 2 - PPSV23) 06/27/2016  . COLONOSCOPY  01/17/2023  . TETANUS/TDAP  07/03/2024  . DEXA SCAN  Completed  . ZOSTAVAX  Completed   Health Maintenance Review LIFESTYLE:  TAD ocass etoh  Sugar beverages:n Sleep: wakening  1-2 am  Taking ocass flexeril witch helps   Remote tobacco 69-99    MEDICARE DOCUMENT QUESTIONS  TO SCAN    Hearing: ok  Vision:  No limitations at present . Last eye check UTD  Safety:  Has smoke detector and wears seat belts.  No firearms. No excess sun exposure. Sees dentist regularly.  Falls: n  Advance directive :  Reviewed  Has one.  Memory: Felt to be good  , no concern from her or her family.  Depression: No anhedonia unusual crying or depressive symptoms  Nutrition: Eats well balanced diet; adequate calcium and vitamin D. No swallowing chewing problems.  Injury: no major injuries in the last six months.  Other healthcare providers:  Reviewed today .  Social:  Lives with spouse married. No pets.  Elderly mom lives next door   Preventive parameters: up-to-date  Reviewed   ADLS:   There are no problems or need for assistance  driving, feeding, obtaining food, dressing, toileting and bathing, managing money using phone. She is independent.   ROS:  See aboe  GEN/ HEENT: No  fever, significant weight changes sweats headaches vision problems hearing changes, CV/ PULM; No chest pain shortness of breath cough, syncope,edema  change in exercise tolerance. GI /GU: No adominal pain, vomiting, change in bowel habits. No blood in the stool. No significant GU symptoms. SKIN/HEME: ,no acute skin rashes suspicious lesions or bleeding. No lymphadenopathy, nodules, masses.  NEURO/ PSYCH:  No neurologic signs such as weakness numbness. No depression anxiety. IMM/ Allergy: No unusual infections.  Allergy .   REST of 12 system review negative except as per HPI   Past Medical History  Diagnosis Date  . Hypertension   . Hyperlipidemia   . Spinal stenosis   . Osteoarthritis   . GERD (gastroesophageal reflux disease)   . Diverticulosis   . Colon polyps     hyperplastic  . Osteoporosis   . History of bronchitis   . Mitral valve prolapse   . Esophageal stricture     Family History  Problem Relation Age of Onset  . Alcohol abuse Mother   . Stroke Mother   . Hypertension Mother   . Diabetes Mother   . Hyperlipidemia Father   . Breast cancer Sister   . Colon cancer Neg Hx     Social History   Social History  . Marital Status: Married    Spouse Name: N/A  . Number of Children: 2  . Years of Education: N/A  Occupational History  . Retired    Social History Main Topics  . Smoking status: Former Smoker -- 1.00 packs/day for 29 years    Types: Cigarettes    Quit date: 05/09/1999  . Smokeless tobacco: Never Used  . Alcohol Use: 0.0 oz/week    0 Standard drinks or equivalent per week     Comment: [place]  . Drug Use: No  . Sexual Activity: Not Asked   Other Topics Concern  . None   Social History Narrative   Married   HH of 2 helps take care 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   Neg td neg ets    Outpatient  Encounter Prescriptions as of 06/28/2015  Medication Sig  . acetaminophen (TYLENOL) 500 MG tablet Take 500-1,000 mg by mouth every 6 (six) hours as needed for moderate pain.  . cyclobenzaprine (FLEXERIL) 10 MG tablet TAKE ONE-HALF TO ONE TABLET BY MOUTH THREE TIMES DAILY AS NEEDED FOR MUSCLE SPASM. (Patient taking differently: Take 5-10 mg by mouth at bedtime as needed for muscle spasms. TAKE ONE-HALF TO ONE TABLET BY MOUTH THREE TIMES DAILY AS NEEDED FOR MUSCLE SPASM.)  . losartan-hydrochlorothiazide (HYZAAR) 100-25 MG tablet TAKE ONE TABLET BY MOUTH ONCE DAILY  . Multiple Vitamin (MULTIVITAMIN WITH MINERALS) TABS tablet Take 1 tablet by mouth daily.  . naproxen sodium (ANAPROX) 220 MG tablet Take 440 mg by mouth 2 (two) times daily as needed (pain).  . Omega-3 Fatty Acids (FISH OIL PO) Take 2 capsules by mouth daily.  Marland Kitchen omeprazole (PRILOSEC) 20 MG capsule Take 1 capsule (20 mg total) by mouth daily.  Marland Kitchen aspirin EC 81 MG tablet Take 81 mg by mouth daily. Reported on 06/28/2015   No facility-administered encounter medications on file as of 06/28/2015.    EXAM:  BP 132/78 mmHg  Temp(Src) 98.5 F (36.9 C) (Oral)  Ht '5\' 3"'$  (1.6 m)  Wt 142 lb 14.4 oz (64.819 kg)  BMI 25.32 kg/m2  Body mass index is 25.32 kg/(m^2).  Physical Exam: Vital signs reviewed KDT:OIZT is a well-developed well-nourished alert cooperative   who appears stated age in no acute distress.  HEENT: normocephalic atraumatic , Eyes: PERRL EOM's full, conjunctiva clear, Nares: paten,t no deformity discharge or tenderness., Ears: no deformity EAC's clear TMs with normal landmarks. Mouth: clear OP, no lesions, edema.  Moist mucous membranes. Dentition in adequate repair. NECK: supple without masses, thyromegaly or bruits. CHEST/PULM:  Clear to auscultation and percussion breath sounds equal no wheeze , rales or rhonchi. No chest wall deformities or tenderness. CV: PMI is nondisplaced, S1 S2 no gallops, murmurs, rubs. Peripheral  pulses are full without delay.No JVD . Breast: normal by inspection . No dimpling, discharge, masses, tenderness or discharge . ABDOMEN: Bowel sounds normal nontender  No guard or rebound, no hepato splenomegal no CVA tenderness.   Extremtities:  No clubbing cyanosis or edema, no acute joint swelling or redness no focal atrophy well healed scar on left knee  NEURO:  Oriented x3, cranial nerves 3-12 appear to be intact, no obvious focal weakness,gait within normal limits no abnormal reflexes  SKIN: No acute rashes normal turgor, color, no bruising or petechiae. PSYCH: Oriented, good eye contact, no obvious depression anxiety, cognition and judgment appear normal. LN: no cervical axillary inguinal adenopathy No noted deficits in memory, attention, and speech.   Lab Results  Component Value Date   WBC 8.4 06/21/2015   HGB 13.9 06/21/2015   HCT 42.7 06/21/2015   PLT 291.0 06/21/2015   GLUCOSE 100* 06/21/2015   CHOL 228* 06/21/2015   TRIG 130.0 06/21/2015   HDL 58.00 06/21/2015   LDLDIRECT 127.6 06/23/2013   LDLCALC 144* 06/21/2015   ALT 21 06/21/2015   AST 21 06/21/2015   NA 142 06/21/2015   K 4.2 06/21/2015   CL 105 06/21/2015   CREATININE 0.74 06/21/2015   BUN 21 06/21/2015   CO2 29 06/21/2015   TSH 3.87 06/21/2015   INR 0.93 04/08/2015    ASSESSMENT AND PLAN:  Discussed the following assessment and plan:  Welcome to Medicare preventive visit  Essential hypertension - controlled continue medsa  Medication management  HLD (hyperlipidemia) - increase from last year  lsi discussed and follow  Sleep disturbance - see text  caution with meds counseling  follow  Primary osteoarthritis of both knees  Need for vaccination with 13-polyvalent pneumococcal conjugate vaccine - Plan: Pneumococcal conjugate vaccine 13-valent Asa stroke  Assessment lower than average at this time   Patient Care Team: Burnis Medin, MD as PCP - General Almedia Balls, MD (Orthopedic Surgery) Irene Shipper, MD (Gastroenterology) Gaynelle Arabian, MD as Consulting Physician (Orthopedic Surgery)  Patient Instructions  Decreasing   Med for sleep .  Look into  Flexeril as needed.  But sleep hygiene   Goal to not use  Medication  On a regular basis .  Risk benefit   Of asa . TBD    Health Maintenance, Female Adopting a healthy lifestyle and getting preventive care can go a long way to promote health and wellness. Talk with your health care provider about what schedule of regular examinations is right for you. This is a good chance for you to check in with your provider about disease prevention and staying healthy. In between checkups, there are plenty of things you can do on your own. Experts have done a lot of research about which lifestyle changes and preventive measures are most likely to keep you healthy. Ask your health care provider for more information. WEIGHT AND DIET  Eat a healthy diet  Be sure to include plenty of vegetables, fruits, low-fat dairy products, and lean protein.  Do not eat a lot of foods high in solid fats, added sugars, or salt.  Get regular exercise. This is one of the most important things you can do for your health.  Most adults should exercise for at least 150 minutes each week. The exercise should increase your heart rate and make you sweat (moderate-intensity exercise).  Most adults should also do strengthening exercises at least twice a week. This is in addition to the moderate-intensity exercise.  Maintain a healthy weight  Body mass index (BMI) is a measurement that can be used to identify possible weight problems. It estimates body fat based on height and weight. Your health care provider can help determine your BMI and help you achieve or maintain a healthy weight.  For females 37 years of age and older:   A BMI below 18.5 is considered underweight.  A BMI of 18.5 to 24.9 is normal.  A BMI of 25 to 29.9 is considered overweight.  A BMI of 30 and  above is considered obese.  Watch levels of cholesterol and blood lipids  You should start having your blood tested for lipids and cholesterol at 66 years of age, then have this test every 5 years.  You may need to have your cholesterol levels checked more often if:  Your lipid or cholesterol levels are high.  You are older than 66 years of age.  You are at high risk for heart disease.  CANCER SCREENING   Lung Cancer  Lung cancer screening is recommended for adults 13-69 years old who are at high risk for lung cancer because of a history of smoking.  A yearly low-dose CT scan of the lungs is recommended for people who:  Currently smoke.  Have quit within the past 15 years.  Have at least a 30-pack-year history of smoking. A pack year is smoking an average of one pack of cigarettes a day for 1 year.  Yearly screening should continue until it has been 15 years since you quit.  Yearly screening should stop if you develop a health problem that would prevent you from having lung cancer treatment.  Breast Cancer  Practice breast self-awareness. This means understanding how your breasts normally appear and feel.  It also means doing regular breast self-exams. Let your health care provider know about any changes, no matter how small.  If you are in your 20s or 30s, you should have a clinical breast exam (CBE) by a health care provider every 1-3 years as part of a regular health exam.  If you are 31 or older, have a CBE every year. Also consider having a breast X-ray (mammogram) every year.  If you have a family history of breast cancer, talk to your health care provider about genetic screening.  If you are at high risk for breast cancer, talk to your health care provider about having an MRI and a mammogram every year.  Breast cancer gene (BRCA) assessment is recommended for women who have family members with BRCA-related cancers. BRCA-related cancers  include:  Breast.  Ovarian.  Tubal.  Peritoneal cancers.  Results of the assessment will determine the need for genetic counseling and BRCA1 and BRCA2 testing. Cervical Cancer Your health care provider may recommend that you be screened regularly for cancer of the pelvic organs (ovaries, uterus, and vagina). This screening involves a pelvic examination, including checking for microscopic changes to the surface of your cervix (Pap test). You may be encouraged to have this screening done every 3 years, beginning at age 80.  For women ages 27-65, health care providers may recommend pelvic exams and Pap testing every 3 years, or they may recommend the Pap and pelvic exam, combined with testing for human papilloma virus (HPV), every 5 years. Some types of HPV increase your risk of cervical cancer. Testing for HPV may also be done on women of any age with unclear Pap test results.  Other health care providers may not recommend any screening for nonpregnant women who are considered low risk for pelvic cancer and who do not have symptoms. Ask your health care provider if a screening pelvic exam is right for you.  If you have had past treatment for cervical cancer or a condition that could lead to cancer, you need Pap tests and screening for cancer for at least 20 years after your treatment. If Pap tests have been discontinued, your risk factors (such as having a new sexual partner) need to be reassessed to determine if screening should resume. Some women have medical problems that increase the chance of getting cervical cancer. In these cases, your health care provider may recommend more frequent screening and Pap tests. Colorectal Cancer  This type of cancer can be detected and  often prevented.  Routine colorectal cancer screening usually begins at 66 years of age and continues through 66 years of age.  Your health care provider may recommend screening at an earlier age if you have risk factors for  colon cancer.  Your health care provider may also recommend using home test kits to check for hidden blood in the stool.  A small camera at the end of a tube can be used to examine your colon directly (sigmoidoscopy or colonoscopy). This is done to check for the earliest forms of colorectal cancer.  Routine screening usually begins at age 82.  Direct examination of the colon should be repeated every 5-10 years through 66 years of age. However, you may need to be screened more often if early forms of precancerous polyps or small growths are found. Skin Cancer  Check your skin from head to toe regularly.  Tell your health care provider about any new moles or changes in moles, especially if there is a change in a mole's shape or color.  Also tell your health care provider if you have a mole that is larger than the size of a pencil eraser.  Always use sunscreen. Apply sunscreen liberally and repeatedly throughout the day.  Protect yourself by wearing long sleeves, pants, a wide-brimmed hat, and sunglasses whenever you are outside. HEART DISEASE, DIABETES, AND HIGH BLOOD PRESSURE   High blood pressure causes heart disease and increases the risk of stroke. High blood pressure is more likely to develop in:  People who have blood pressure in the high end of the normal range (130-139/85-89 mm Hg).  People who are overweight or obese.  People who are African American.  If you are 3-36 years of age, have your blood pressure checked every 3-5 years. If you are 32 years of age or older, have your blood pressure checked every year. You should have your blood pressure measured twice--once when you are at a hospital or clinic, and once when you are not at a hospital or clinic. Record the average of the two measurements. To check your blood pressure when you are not at a hospital or clinic, you can use:  An automated blood pressure machine at a pharmacy.  A home blood pressure monitor.  If you  are between 4 years and 13 years old, ask your health care provider if you should take aspirin to prevent strokes.  Have regular diabetes screenings. This involves taking a blood sample to check your fasting blood sugar level.  If you are at a normal weight and have a low risk for diabetes, have this test once every three years after 66 years of age.  If you are overweight and have a high risk for diabetes, consider being tested at a younger age or more often. PREVENTING INFECTION  Hepatitis B  If you have a higher risk for hepatitis B, you should be screened for this virus. You are considered at high risk for hepatitis B if:  You were born in a country where hepatitis B is common. Ask your health care provider which countries are considered high risk.  Your parents were born in a high-risk country, and you have not been immunized against hepatitis B (hepatitis B vaccine).  You have HIV or AIDS.  You use needles to inject street drugs.  You live with someone who has hepatitis B.  You have had sex with someone who has hepatitis B.  You get hemodialysis treatment.  You take certain medicines for conditions,  including cancer, organ transplantation, and autoimmune conditions. Hepatitis C  Blood testing is recommended for:  Everyone born from [birth year] through 1966.  Anyone with known risk factors for hepatitis C. Sexually transmitted infections (STIs)  You should be screened for sexually transmitted infections (STIs) including gonorrhea and chlamydia if:  You are sexually active and are younger than 66 years of age.  You are older than 66 years of age and your health care provider tells you that you are at risk for this type of infection.  Your sexual activity has changed since you were last screened and you are at an increased risk for chlamydia or gonorrhea. Ask your health care provider if you are at risk.  If you do not have HIV, but are at risk, it may be recommended that you  take a prescription medicine daily to prevent HIV infection. This is called pre-exposure prophylaxis (PrEP). You are considered at risk if:  You are sexually active and do not regularly use condoms or know the HIV status of your partner(s).  You take drugs by injection.  You are sexually active with a partner who has HIV. Talk with your health care provider about whether you are at high risk of being infected with HIV. If you choose to begin PrEP, you should first be tested for HIV. You should then be tested every 3 months for as long as you are taking PrEP.  PREGNANCY   If you are premenopausal and you may become pregnant, ask your health care provider about preconception counseling.  If you may become pregnant, take 400 to 800 micrograms (mcg) of folic acid every day.  If you want to prevent pregnancy, talk to your health care provider about birth control (contraception). OSTEOPOROSIS AND MENOPAUSE   Osteoporosis is a disease in which the bones lose minerals and strength with aging. This can result in serious bone fractures. Your risk for osteoporosis can be identified using a bone density scan.  If you are 53 years of age or older, or if you are at risk for osteoporosis and fractures, ask your health care provider if you should be screened.  Ask your health care provider whether you should take a calcium or vitamin D supplement to lower your risk for osteoporosis.  Menopause may have certain physical symptoms and risks.  Hormone replacement therapy may reduce some of these symptoms and risks. Talk to your health care provider about whether hormone replacement therapy is right for you.  HOME CARE INSTRUCTIONS   Schedule regular health, dental, and eye exams.  Stay current with your immunizations.   Do not use any tobacco products including cigarettes, chewing tobacco, or electronic cigarettes.  If you are pregnant, do not drink alcohol.  If you are breastfeeding, limit how  much and how often you drink alcohol.  Limit alcohol intake to no more than 1 drink per day for nonpregnant women. One drink equals 12 ounces of beer, 5 ounces of wine, or 1 ounces of hard liquor.  Do not use street drugs.  Do not share needles.  Ask your health care provider for help if you need support or information about quitting drugs.  Tell your health care provider if you often feel depressed.  Tell your health care provider if you have ever been abused or do not feel safe at home.   This information is not intended to replace advice given to you by your health care provider. Make sure you discuss any questions you have with your  health care provider.   Document Released: 11/07/2010 Document Revised: 05/15/2014 Document Reviewed: 03/26/2013 Elsevier Interactive Patient Education 2016 [employer]    Insomnia Insomnia is a sleep disorder that makes it difficult to fall asleep or to stay asleep. Insomnia can cause tiredness (fatigue), low energy, difficulty concentrating, mood swings, and poor performance at work or school.  There are three different ways to classify insomnia:  Difficulty falling asleep.  Difficulty staying asleep.  Waking up too early in the morning. Any type of insomnia can be long-term (chronic) or short-term (acute). Both are common. Short-term insomnia usually lasts for three months or less. Chronic insomnia occurs at least three times a week for longer than three months. CAUSES  Insomnia may be caused by another condition, situation, or substance, such as:  Anxiety.  Certain medicines.  Gastroesophageal reflux disease (GERD) or other gastrointestinal conditions.  Asthma or other breathing conditions.  Restless legs syndrome, sleep apnea, or other sleep disorders.  Chronic pain.  Menopause. This may include hot flashes.  Stroke.  Abuse of alcohol, tobacco, or illegal drugs.  Depression.  Caffeine.   Neurological disorders, such  as Alzheimer disease.  An overactive thyroid (hyperthyroidism). The cause of insomnia may not be known. RISK FACTORS Risk factors for insomnia include:  Gender. Women are more commonly affected than men.  Age. Insomnia is more common as you get older.  Stress. This may involve your professional or personal life.  Income. Insomnia is more common in people with lower income.  Lack of exercise.   Irregular work schedule or night shifts.  Traveling between different time zones. SIGNS AND SYMPTOMS If you have insomnia, trouble falling asleep or trouble staying asleep is the main symptom. This may lead to other symptoms, such as:  Feeling fatigued.  Feeling nervous about going to sleep.  Not feeling rested in the morning.  Having trouble concentrating.  Feeling irritable, anxious, or depressed. TREATMENT  Treatment for insomnia depends on the cause. If your insomnia is caused by an underlying condition, treatment will focus on addressing the condition. Treatment may also include:   Medicines to help you sleep.  Counseling or therapy.  Lifestyle adjustments. HOME CARE INSTRUCTIONS   Take medicines only as directed by your health care provider.  Keep regular sleeping and waking hours. Avoid naps.  Keep a sleep diary to help you and your health care provider figure out what could be causing your insomnia. Include:   When you sleep.  When you wake up during the night.  How well you sleep.   How rested you feel the next day.  Any side effects of medicines you are taking.  What you eat and drink.   Make your bedroom a comfortable place where it is easy to fall asleep:  Put up shades or special blackout curtains to block light from outside.  Use a white noise machine to block noise.  Keep the temperature cool.   Exercise regularly as directed by your health care provider. Avoid exercising right before bedtime.  Use relaxation techniques to manage stress.  Ask your health care provider to suggest some techniques that may work well for you. These may include:  Breathing exercises.  Routines to release muscle tension.  Visualizing peaceful scenes.  Cut back on alcohol, caffeinated beverages, and cigarettes, especially close to bedtime. These can disrupt your sleep.  Do not overeat or eat spicy foods right before bedtime. This can lead to digestive discomfort that can make it hard for you to  sleep.  Limit screen use before bedtime. This includes:  Watching TV.  Using your smartphone, tablet, and computer.  Stick to a routine. This can help you fall asleep faster. Try to do a quiet activity, brush your teeth, and go to bed at the same time each night.  Get out of bed if you are still awake after 15 minutes of trying to sleep. Keep the lights down, but try reading or doing a quiet activity. When you feel sleepy, go back to bed.  Make sure that you drive carefully. Avoid driving if you feel very sleepy.  Keep all follow-up appointments as directed by your health care provider. This is important. SEEK MEDICAL CARE IF:   You are tired throughout the day or have trouble in your daily routine due to sleepiness.  You continue to have sleep problems or your sleep problems get worse. SEEK IMMEDIATE MEDICAL CARE IF:   You have serious thoughts about hurting yourself or someone else.   This information is not intended to replace advice given to you by your health care provider. Make sure you discuss any questions you have with your health care provider.   Document Released: 04/21/2000 Document Revised: 01/13/2015 Document Reviewed: 01/23/2014 Elsevier Interactive Patient Education 2016 [place]. Eleonore Shippee M.D.

## 2015-06-30 DIAGNOSIS — H2512 Age-related nuclear cataract, left eye: Secondary | ICD-10-CM | POA: Diagnosis not present

## 2015-07-07 DIAGNOSIS — H2512 Age-related nuclear cataract, left eye: Secondary | ICD-10-CM | POA: Diagnosis not present

## 2015-07-12 DIAGNOSIS — Z1231 Encounter for screening mammogram for malignant neoplasm of breast: Secondary | ICD-10-CM | POA: Diagnosis not present

## 2015-07-12 DIAGNOSIS — M8589 Other specified disorders of bone density and structure, multiple sites: Secondary | ICD-10-CM | POA: Diagnosis not present

## 2015-07-12 DIAGNOSIS — Z803 Family history of malignant neoplasm of breast: Secondary | ICD-10-CM | POA: Diagnosis not present

## 2015-07-12 LAB — HM MAMMOGRAPHY

## 2015-07-13 ENCOUNTER — Encounter: Payer: Self-pay | Admitting: Family Medicine

## 2015-07-19 DIAGNOSIS — H2511 Age-related nuclear cataract, right eye: Secondary | ICD-10-CM | POA: Diagnosis not present

## 2015-07-21 DIAGNOSIS — H2511 Age-related nuclear cataract, right eye: Secondary | ICD-10-CM | POA: Diagnosis not present

## 2015-07-22 ENCOUNTER — Encounter: Payer: Self-pay | Admitting: Internal Medicine

## 2015-07-23 ENCOUNTER — Other Ambulatory Visit: Payer: Self-pay | Admitting: Internal Medicine

## 2015-09-20 ENCOUNTER — Encounter: Payer: Self-pay | Admitting: Internal Medicine

## 2015-09-23 DIAGNOSIS — H43813 Vitreous degeneration, bilateral: Secondary | ICD-10-CM | POA: Diagnosis not present

## 2015-10-22 ENCOUNTER — Telehealth: Payer: Self-pay | Admitting: Internal Medicine

## 2015-10-22 NOTE — Telephone Encounter (Signed)
Discussed with pt that the best way to stop the omeprazole is to ween off of the med, try every other day and then try to stop the med. Pt verbalized understanding.

## 2015-10-25 ENCOUNTER — Other Ambulatory Visit: Payer: Self-pay | Admitting: Internal Medicine

## 2015-10-26 ENCOUNTER — Other Ambulatory Visit: Payer: Self-pay | Admitting: General Practice

## 2015-10-26 MED ORDER — LOSARTAN POTASSIUM-HCTZ 100-25 MG PO TABS: 1.0000 | ORAL_TABLET | Freq: Every day | ORAL | Status: DC

## 2016-01-12 NOTE — Progress Notes (Signed)
Pre visit review using our clinic review tool, if applicable. No additional management support is needed unless otherwise documented below in the visit note.  Chief Complaint  Patient presents with  . Cough    Ongoing since June.    HPI: Jacqueline Myers 65 y.o.  sda appt for ongoing cough   Onset June cough alone and some at night progressing until last  Month  Congested in chest  No fever sob .  ?   Feels some better at tnight this week  denioes fever urs until this month a bit .  Had ct chest 12   Mild upper  Emphysema changes  Last pfts 2016 min obst not  Reversable.  ROS: See pertinent positives and negatives per HPI.  Past Medical History:  Diagnosis Date  . Colon polyps    hyperplastic  . Diverticulosis   . Esophageal stricture   . GERD (gastroesophageal reflux disease)   . History of bronchitis   . Hyperlipidemia   . Hypertension   . Mitral valve prolapse   . Osteoarthritis   . Osteoporosis   . Spinal stenosis     Family History  Problem Relation Age of Onset  . Alcohol abuse Mother   . Stroke Mother   . Hypertension Mother   . Diabetes Mother   . Hyperlipidemia Father   . Breast cancer Sister   . Colon cancer Neg Hx     Social History   Social History  . Marital status: Married    Spouse name: N/A  . Number of children: 2  . Years of education: N/A   Occupational History  . Retired    Social History Main Topics  . Smoking status: Former Smoker    Packs/day: 1.00    Years: 29.00    Types: Cigarettes    Quit date: 05/09/1999  . Smokeless tobacco: Never Used  . Alcohol use 0.0 oz/week     Comment: OCC- GLASS OF WINE  . Drug use: No  . Sexual activity: Not Asked   Other Topics Concern  . None   Social History Narrative   Married   HH of 2 helps take care 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   Neg td neg ets     Outpatient Medications Prior to Visit  Medication Sig Dispense Refill  . acetaminophen (TYLENOL) 500 MG tablet Take 500-1,000 mg by mouth every 6 (six) hours as needed for moderate pain.    . cyclobenzaprine (FLEXERIL) 10 MG tablet TAKE ONE-HALF TO ONE TABLET BY MOUTH THREE TIMES DAILY AS NEEDED FOR MUSCLE SPASM. (Patient taking differently: Take 5-10 mg by mouth at bedtime as needed for muscle spasms. TAKE ONE-HALF TO ONE TABLET BY MOUTH THREE TIMES DAILY AS NEEDED FOR MUSCLE SPASM.) 90 tablet 0  . losartan-hydrochlorothiazide (HYZAAR) 100-25 MG tablet Take 1 tablet by mouth daily. 90 tablet 0  . Multiple Vitamin (MULTIVITAMIN WITH MINERALS) TABS tablet Take 1 tablet by mouth daily.    . naproxen sodium (ANAPROX) 220 MG tablet Take 440 mg by mouth 2 (two) times daily as needed (pain).    . Omega-3 Fatty Acids (FISH OIL PO) Take 2 capsules by mouth daily.    Marland Kitchen omeprazole (PRILOSEC) 20 MG capsule Take 1 capsule (20 mg total) by mouth daily. 30 capsule 11  . aspirin EC 81 MG tablet Take 81  mg by mouth daily. Reported on 06/28/2015     No facility-administered medications prior to visit.      EXAM:  BP (!) 142/72 (BP Location: Right Arm, Patient Position: Sitting, Cuff Size: Normal)   Pulse 86   Temp 98.5 F (36.9 C) (Oral)   Ht 5\' 3"  (1.6 m)   Wt 141 lb 12.8 oz (64.3 kg)   SpO2 98%   BMI 25.12 kg/m   Body mass index is 25.12 kg/m.  GENERAL: vitals reviewed and listed above, alert, oriented, appears well hydrated and in no acute distress HEENT: atraumatic, conjunctiva  clear, no obvious abnormalities on inspection of external nose and earstm clear seems nasally congested  OP : no lesion edema or exudate  NECK: no obvious masses on inspection palpation  LUNGS: clear to auscultation bilaterally, no wheezes, rales or rhonchi,CV: HRRR, no clubbing cyanosis or  peripheral edema nl cap refill  MS: moves all extremities without noticeable focal  abnormality PSYCH: pleasant and  cooperative, no obvious depression or anxiety bp readings reviewed   Many 140 -  145 ocass 150   + ASSESSMENT AND PLAN:  Discussed the following assessment and plan:  Cough, persistent - ? multiple factors   some phelgm recnetly  x ray and empritic rx   consider  spririva if ongoing but this sounds like som upper airway also  - Plan: DG Chest 2 View  Essential hypertension - change  med  to valsartan ? help with cough and better control - Plan: DG Chest 2 View  Atherosclerosis of aorta (HCC) - incidental finding on x ray  Need for prophylactic vaccination and inoculation against influenza - Plan: Flu vaccine HIGH DOSE PF (Fluzone High dose) consdieration of spririva etc if ongoing  also   But doesn't  meet criteria for chronic  bronchitis etc  Poss added gerd   -Patient advised to return or notify health care team  if symptoms worsen ,persist or new concerns arise.  Patient Instructions  Get chest x ray  If ok  will rx for  On going bronchitis  With antibiotic and  Prednisone.   Will change BP med  For control and in the off change that   contributing to the cough .   Try off the fish oil incase  Oils irritating upper airway .   Plan fu depending on results or   About 1 month      Jayde Daffin K. Satin Boal M.D.  Rhunette Croft ray neg lung abnormalities   Atherosclerosis   In aorta  See result note

## 2016-01-13 ENCOUNTER — Ambulatory Visit (INDEPENDENT_AMBULATORY_CARE_PROVIDER_SITE_OTHER)
Admission: RE | Admit: 2016-01-13 | Discharge: 2016-01-13 | Disposition: A | Discharge status: Home / Self Care | Payer: Medicare Other | Source: Ambulatory Visit | Attending: Internal Medicine | Admitting: Internal Medicine

## 2016-01-13 ENCOUNTER — Ambulatory Visit (INDEPENDENT_AMBULATORY_CARE_PROVIDER_SITE_OTHER): Payer: Medicare Other | Admitting: Internal Medicine

## 2016-01-13 ENCOUNTER — Encounter: Payer: Self-pay | Admitting: Internal Medicine

## 2016-01-13 VITALS — BP 142/72 | HR 86 | Temp 98.5°F | Ht 63.0 in | Wt 141.8 lb

## 2016-01-13 DIAGNOSIS — R05 Cough: Secondary | ICD-10-CM

## 2016-01-13 DIAGNOSIS — Z23 Encounter for immunization: Secondary | ICD-10-CM | POA: Diagnosis not present

## 2016-01-13 DIAGNOSIS — I10 Essential (primary) hypertension: Secondary | ICD-10-CM | POA: Diagnosis not present

## 2016-01-13 DIAGNOSIS — R053 Chronic cough: Secondary | ICD-10-CM

## 2016-01-13 DIAGNOSIS — I700 Atherosclerosis of aorta: Secondary | ICD-10-CM

## 2016-01-13 MED ORDER — PREDNISONE 20 MG PO TABS: ORAL_TABLET | ORAL | 0 refills | Status: DC

## 2016-01-13 MED ORDER — DOXYCYCLINE HYCLATE 100 MG PO TABS: 100.0000 mg | ORAL_TABLET | Freq: Two times a day (BID) | ORAL | 0 refills | Status: DC

## 2016-01-13 MED ORDER — VALSARTAN-HYDROCHLOROTHIAZIDE 160-25 MG PO TABS: 1.0000 | ORAL_TABLET | Freq: Every day | ORAL | 3 refills | Status: DC

## 2016-01-13 NOTE — Patient Instructions (Addendum)
Get chest x ray  If ok  will rx for  On going bronchitis  With antibiotic and  Prednisone.   Will change BP med  For control and in the off change that   contributing to the cough .   Try off the fish oil incase  Oils irritating upper airway .   Plan fu depending on results or   About 1 month

## 2016-02-07 ENCOUNTER — Encounter: Payer: Self-pay | Admitting: Internal Medicine

## 2016-02-07 MED ORDER — AMLODIPINE BESYLATE 5 MG PO TABS: 5.0000 mg | ORAL_TABLET | Freq: Every day | ORAL | 0 refills | Status: DC

## 2016-02-07 NOTE — Telephone Encounter (Signed)
Please add  Amlodipine 5 mg 1 po qd disp 30  No refills and add this to current meds  Until get to office visit

## 2016-02-08 NOTE — Telephone Encounter (Signed)
Spoke to the pt.  She did receive message.  However, has not picked up the medication.  Stated that she read online that glucosamine can cause elevations in bp readings.  Has stopped glucosamine. Today's bp readings are 147/63, 122/63  And 141/63.  Wants to know if she still needs to pick up medication.  Please advise.  Thanks!!

## 2016-02-09 ENCOUNTER — Encounter: Payer: Self-pay | Admitting: Internal Medicine

## 2016-02-14 ENCOUNTER — Encounter: Payer: Self-pay | Admitting: Internal Medicine

## 2016-02-14 ENCOUNTER — Ambulatory Visit (INDEPENDENT_AMBULATORY_CARE_PROVIDER_SITE_OTHER): Payer: Medicare Other | Admitting: Internal Medicine

## 2016-02-14 VITALS — BP 136/76 | Temp 97.9°F | Ht 63.0 in | Wt 138.0 lb

## 2016-02-14 DIAGNOSIS — G479 Sleep disorder, unspecified: Secondary | ICD-10-CM | POA: Diagnosis not present

## 2016-02-14 DIAGNOSIS — F411 Generalized anxiety disorder: Secondary | ICD-10-CM

## 2016-02-14 DIAGNOSIS — R0789 Other chest pain: Secondary | ICD-10-CM

## 2016-02-14 DIAGNOSIS — I10 Essential (primary) hypertension: Secondary | ICD-10-CM

## 2016-02-14 DIAGNOSIS — R05 Cough: Secondary | ICD-10-CM

## 2016-02-14 DIAGNOSIS — I700 Atherosclerosis of aorta: Secondary | ICD-10-CM | POA: Diagnosis not present

## 2016-02-14 DIAGNOSIS — R053 Chronic cough: Secondary | ICD-10-CM

## 2016-02-14 MED ORDER — CITALOPRAM HYDROBROMIDE 10 MG PO TABS: 5.0000 mg | ORAL_TABLET | Freq: Every day | ORAL | 1 refills | Status: DC

## 2016-02-14 NOTE — Progress Notes (Signed)
Chief Complaint  Patient presents with  . Follow-up    BP    HPI: Jacqueline Myers 65 y.o.     Comes n with husband today  issuies BP  Has logs ntoed bp much better   After  Stopping supplements and limiting aleve.   Had on low bp 108 after  Exercise no dizziness then   Cough a bit better   80 % not conncerned about this  No reflux per se  Twinge  In chest and pain incenter of bad  Cant  Catch a depp breath at times   . Anxiety ?  When exercises not getting these sx   Is back on fish oil cause helps triglycerides    Insomnia  And now worse   zequil  And not helping .  Anxiety   In the night.  ocass tightness .    Awakens with feeling  afraid of dying  .     Dec sleep  In past minth   Mom caretaling has pvd lipids   Cannot stop obsessing  Effecting sleep .   When first started.  Left twing   And goes  Away seconds . And can exercise.    Says " cannot take statins  " had se of zocor but hasnt been on other meds . ( mom on very low dose cretor and doesn't tolerate meds either)  ROS: See pertinent positives and negatives per HPI. One sided haeadache not bad  ocassional   Past Medical History:  Diagnosis Date  . Colon polyps    hyperplastic  . Diverticulosis   . Esophageal stricture   . GERD (gastroesophageal reflux disease)   . History of bronchitis   . Hyperlipidemia   . Hypertension   . Mitral valve prolapse   . Osteoarthritis   . Osteoporosis   . Spinal stenosis     Family History  Problem Relation Age of Onset  . Alcohol abuse Mother   . Stroke Mother   . Hypertension Mother   . Diabetes Mother   . Hyperlipidemia Father   . Breast cancer Sister   . Colon cancer Neg Hx     Social History   Social History  . Marital status: Married    Spouse name: N/A  . Number of children: 2  . Years of education: N/A   Occupational History  . Retired    Social History Main Topics  . Smoking status: Former Smoker    Packs/day: 1.00    Years: 29.00    Types:  Cigarettes    Quit date: 05/09/1999  . Smokeless tobacco: Never Used  . Alcohol use 0.0 oz/week     Comment: OCC- GLASS OF WINE  . Drug use: No  . Sexual activity: Not Asked   Other Topics Concern  . None   Social History Narrative   Married   HH of 2 helps take care 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   Neg td neg ets    Outpatient Medications Prior to Visit  Medication Sig Dispense Refill  . acetaminophen (TYLENOL) 500 MG tablet Take 500-1,000 mg by mouth every 6 (six) hours as needed for moderate pain.    . cyclobenzaprine (FLEXERIL) 10 MG tablet TAKE ONE-HALF TO ONE TABLET BY MOUTH THREE TIMES DAILY AS NEEDED FOR MUSCLE SPASM. (Patient taking differently: Take  5-10 mg by mouth at bedtime as needed for muscle spasms. TAKE ONE-HALF TO ONE TABLET BY MOUTH THREE TIMES DAILY AS NEEDED FOR MUSCLE SPASM.) 90 tablet 0  . NON FORMULARY CALCIUM 1200 ER AND VITAMIN D    . Omega-3 Fatty Acids (FISH OIL PO) Take 2 capsules by mouth daily.    . valsartan-hydrochlorothiazide (DIOVAN-HCT) 160-25 MG tablet Take 1 tablet by mouth daily. 30 tablet 3  . amLODipine (NORVASC) 5 MG tablet Take 1 tablet (5 mg total) by mouth daily. 30 tablet 0  . doxycycline (VIBRA-TABS) 100 MG tablet Take 1 tablet (100 mg total) by mouth 2 (two) times daily. 14 tablet 0  . losartan-hydrochlorothiazide (HYZAAR) 100-25 MG tablet Take 1 tablet by mouth daily. 90 tablet 0  . Multiple Vitamin (MULTIVITAMIN WITH MINERALS) TABS tablet Take 1 tablet by mouth daily.    . naproxen sodium (ANAPROX) 220 MG tablet Take 440 mg by mouth 2 (two) times daily as needed (pain).    Marland Kitchen omeprazole (PRILOSEC) 20 MG capsule Take 1 capsule (20 mg total) by mouth daily. 30 capsule 11  . predniSONE (DELTASONE) 20 MG tablet Take 3 po qd for 2 days then 2 po qd for 3 days,or as directed 12 tablet 0   No facility-administered  medications prior to visit.      EXAM:  BP 136/76 (BP Location: Right Arm, Patient Position: Sitting, Cuff Size: Normal)   Temp 97.9 F (36.6 C) (Oral)   Ht 5\' 3"  (1.6 m)   Wt 138 lb (62.6 kg)   BMI 24.45 kg/m   Body mass index is 24.45 kg/m.  GENERAL: vitals reviewed and listed above, alert, oriented, appears well hydrated and in no acute distress  Tired and anxious   ocass throat clearing  HEENT: atraumatic, conjunctiva  clear, no obvious abnormalities on inspection of external nose and ears  NECK: no obvious masses on inspection palpation  LUNGS: clear to auscultation bilaterally, no wheezes, rales or rhonchi, good air movement CV: HRRR, no clubbing cyanosis or  peripheral edema nl cap refill  MS: moves all extremities without noticeable focal  abnormality PSYCH: pleasant and cooperative,   Lab Results  Component Value Date   WBC 8.4 06/21/2015   HGB 13.9 06/21/2015   HCT 42.7 06/21/2015   PLT 291.0 06/21/2015   GLUCOSE 100 (H) 06/21/2015   CHOL 228 (H) 06/21/2015   TRIG 130.0 06/21/2015   HDL 58.00 06/21/2015   LDLDIRECT 127.6 06/23/2013   LDLCALC 144 (H) 06/21/2015   ALT 21 06/21/2015   AST 21 06/21/2015   NA 142 06/21/2015   K 4.2 06/21/2015   CL 105 06/21/2015   CREATININE 0.74 06/21/2015   BUN 21 06/21/2015   CO2 29 06/21/2015   TSH 3.87 06/21/2015   INR 0.93 04/08/2015   bp review  ASSESSMENT AND PLAN:  Discussed the following assessment and plan:  Essential hypertension - seems controled on current med valsartan hctz 160/25  continue at thsi time( never tookt he amlodipine) - Plan: Ambulatory referral to Cardiology  Atherosclerosis of aorta ([place]) - incidental finding  has risk  - Plan: Ambulatory referral to Cardiology  Anxiety state - effecting  many of sx but has risk   Cough, persistent  Atypical chest pain - nto cw anxigna but has risk   can walk without sx  consdieration of cv risk assessmsent with fam hx and atherosc on imaging - Plan:  Ambulatory referral to Cardiology  Sleep disturbance Advise to decrease  Number of  bp readings taken     Expectant management.  -Patient advised to return or notify health care team  if symptoms worsen ,persist or new concerns arise. Total visit 43mins > 50% spent counseling and coordinating care as indicated in above note and in instructions to patient .     Patient Instructions  Stay on the same  BP  medication .   Whole pill for now as we discussed . Let us know if  Having so low you fill dizzy.   Begin low dose   Antianxiety  Medication  to be taken every day  For suppression of anxiety .  Contact us    If have ..   ? in interim  Will be contacted about cardiology referral   Plan on trying crestor   In future or other .   ROV in 4 weeks or thereabouts     Mariann Laster K. Panosh M.D.

## 2016-02-14 NOTE — Progress Notes (Signed)
Pre visit review using our clinic review tool, if applicable. No additional management support is needed unless otherwise documented below in the visit note. 

## 2016-02-14 NOTE — Patient Instructions (Addendum)
Stay on the same  BP  medication .   Whole pill for now as we discussed . Let us know if  Having so low you fill dizzy.   Begin low dose   Antianxiety  Medication  to be taken every day  For suppression of anxiety .  Contact us    If have ..   ? in interim  Will be contacted about cardiology referral   Plan on trying crestor   In future or other .   ROV in 4 weeks or thereabouts

## 2016-02-15 ENCOUNTER — Encounter: Payer: Self-pay | Admitting: Internal Medicine

## 2016-02-16 ENCOUNTER — Telehealth: Payer: Self-pay | Admitting: *Deleted

## 2016-02-16 ENCOUNTER — Other Ambulatory Visit: Payer: Self-pay | Admitting: Internal Medicine

## 2016-02-16 MED ORDER — LORAZEPAM 1 MG PO TABS: 0.5000 mg | ORAL_TABLET | Freq: Three times a day (TID) | ORAL | 0 refills | Status: DC | PRN

## 2016-02-16 NOTE — Progress Notes (Signed)
Cardiology Office Note   Date:  02/17/2016   ID:  Jacqueline Myers, DOB [birth year], MRN [id number]  PCP:  Lottie Dawson, MD  Cardiologist:   Skeet Latch, MD   Chief Complaint  Patient presents with  . New Evaluation    refer Dr Regis Bill for hypertension, pt c/o that she can't catch a deep breath,   Using the 30th 1941   History of Present Illness: Jacqueline Myers is a 65 y.o. female with hypertension, hyperlipidemia, and mitral valve prolapse who presents for an evaluation of chest pain.  Jacqueline Myers saw Dr. Shanon Ace on 02/14/16 and reported episodes of chest pain.  For the last month she has noted intermittent episodes of chest discomfort when walking. She describes it as heaviness on the left side of her chest. It is mild to moderate in severity and does not radiate. There is no associated nausea or diaphoresis, but she does feel as though she cannot take a deep breath. The symptoms subside when she rests.  She has also been working with Dr. Regis Bill to better treat her anxiety. She wonders if this may be contributing. She reports some episodes of elevated blood pressures that occur especially at night. Her blood pressure has been as high as 174/84. She feels as though her heart is pounding when this occurs. She also thinks that this may be due to anxiety.  Dr. Regis Bill recently increased her citalopram and this has helped with the nighttime elevation in her blood pressure.  Jacqueline Myers has previously taken simvastatin but didn't tolerate it due to myalgias.  She reports that her diet is fairly good but she does have some fried foods.  She tries to exercise for 30 minutes four times per week.     Past Medical History:  Diagnosis Date  . Colon polyps    hyperplastic  . Diverticulosis   . Esophageal stricture   . GERD (gastroesophageal reflux disease)   . History of bronchitis   . Hyperlipidemia   . Hypertension   . Mitral valve prolapse   . Osteoarthritis    . Osteoporosis   . Spinal stenosis     Past Surgical History:  Procedure Laterality Date  . COLONOSCOPY    . NM MYOVIEW LTD  08/2006   normal for abn ekg  . TONSILLECTOMY    . TOTAL KNEE ARTHROPLASTY Left 04/14/2015   Procedure: LEFT TOTAL KNEE ARTHROPLASTY;  Surgeon: Gaynelle Arabian, MD;  Location: [facility];  Service: Orthopedics;  Laterality: Left;     Current Outpatient Prescriptions  Medication Sig Dispense Refill  . acetaminophen (TYLENOL) 500 MG tablet Take 500-1,000 mg by mouth every 6 (six) hours as needed for moderate pain.    . citalopram (CELEXA) 10 MG tablet Take 0.5 tablets (5 mg total) by mouth daily. Increase to 10 mg per day after 1-2 weeks 30 tablet 1  . cyclobenzaprine (FLEXERIL) 10 MG tablet TAKE ONE-HALF TO ONE TABLET BY MOUTH THREE TIMES DAILY AS NEEDED FOR MUSCLE SPASM. (Patient taking differently: Take 5-10 mg by mouth at bedtime as needed for muscle spasms. TAKE ONE-HALF TO ONE TABLET BY MOUTH THREE TIMES DAILY AS NEEDED FOR MUSCLE SPASM.) 90 tablet 0  . LORazepam (ATIVAN) 1 MG tablet Take 0.5-1 tablets (0.5-1 mg total) by mouth every 8 (eight) hours as needed for anxiety. 15 tablet 0  . NON FORMULARY CALCIUM 1200 ER AND VITAMIN D    . Omega-3 Fatty Acids (FISH OIL PO) Take 2 capsules by mouth  daily.    . valsartan-hydrochlorothiazide (DIOVAN-HCT) 160-25 MG tablet Take 1 tablet by mouth daily. 30 tablet 3  . rosuvastatin (CRESTOR) 5 MG tablet Take 1 tablet (5 mg total) by mouth daily. 30 tablet 5   No current facility-administered medications for this visit.     Allergies:   Lisinopril; Oxycodone; and Simvastatin    Social History:  The patient  reports that she quit smoking about 16 years ago. Her smoking use included Cigarettes. She has a 29.00 pack-year smoking history. She has never used smokeless tobacco. She reports that she drinks alcohol. She reports that she does not use drugs.   Family History:  The patient's family history includes Alcohol abuse in  her mother; Breast cancer in her sister; Diabetes in her mother; Hyperlipidemia in her father and mother; Hypertension in her mother; Stroke in her mother.    ROS:  Please see the history of present illness.   Otherwise, review of systems are positive for none.   All other systems are reviewed and negative.    PHYSICAL EXAM: VS:  BP 121/64   Pulse 81   Ht 5\' 3"  (1.6 m)   Wt 137 lb 9.6 oz (62.4 kg)   BMI 24.37 kg/m  , BMI Body mass index is 24.37 kg/m. GENERAL:  Well appearing HEENT:  Pupils equal round and reactive, fundi not visualized, oral mucosa unremarkable NECK:  No jugular venous distention, waveform within normal limits, carotid upstroke brisk and symmetric, no bruits, no thyromegaly LYMPHATICS:  No cervical adenopathy LUNGS:  Clear to auscultation bilaterally HEART:  RRR.  PMI not displaced or sustained,S1 and S2 within normal limits, no S3, no S4, no clicks, no rubs, no murmurs ABD:  Flat, positive bowel sounds normal in frequency in pitch, no bruits, no rebound, no guarding, no midline pulsatile mass, no hepatomegaly, no splenomegaly EXT:  2 plus pulses throughout, no edema, no cyanosis no clubbing SKIN:  No rashes no nodules NEURO:  Cranial nerves II through XII grossly intact, motor grossly intact throughout PSYCH:  Cognitively intact, oriented to person place and time    EKG:  EKG is ordered today. The ekg ordered today demonstrates Sinus rhythm. Rate 81 bpm. Incomplete right bundle branch block. Cannot rule out prior septal infarct.   Recent Labs: 04/24/2015: Magnesium 1.7 06/21/2015: ALT 21; BUN 21; Creatinine, Ser 0.74; Hemoglobin 13.9; Platelets 291.0; Potassium 4.2; Sodium 142; TSH 3.87    Lipid Panel    Component Value Date/Time   CHOL 228 (H) 06/21/2015 0816   TRIG 130.0 06/21/2015 0816   TRIG 145 04/06/2006 0808   HDL 58.00 06/21/2015 0816   CHOLHDL 4 06/21/2015 0816   VLDL 26.0 06/21/2015 0816   LDLCALC 144 (H) 06/21/2015 0816   LDLDIRECT 127.6  06/23/2013 0807      Wt Readings from Last 3 Encounters:  02/17/16 137 lb 9.6 oz (62.4 kg)  02/14/16 138 lb (62.6 kg)  01/13/16 141 lb 12.8 oz (64.3 kg)      ASSESSMENT AND PLAN:  # Chest pain:  Jacqueline Myers has exertional chest pain.  We will get an exercise Myoview to better evaluate.  I have asked her to start aspirin 81 mg daily.  # Hyperlipidemia: She hasn't tolerated simvastatin due to myalgias.  ASCVD 10 year risk is 7.7%.  We will start rosuvastatin 5 mg daily. We'll check fasting lipids and CMP in 6 weeks.    Current medicines are reviewed at length with the patient today.  The patient does not  have concerns regarding medicines.  The following changes have been made:  Aspirin and rosuvastatin  Labs/ tests ordered today include:   Orders Placed This Encounter  Procedures  . Lipid panel  . Comprehensive metabolic panel  . Myocardial Perfusion Imaging  . EKG 12-Lead     Disposition:   FU with Jacqueline Myers C. Oval Linsey, MD, [facility] in 6 weeks.    This note was written with the assistance of speech recognition software.  Please excuse any transcriptional errors.  Signed, Kaeden Mester C. Oval Linsey, MD, [facility]  02/17/2016 4:42 PM    [facility]

## 2016-02-16 NOTE — Progress Notes (Signed)
See phone  Walk in message  About concern and no sleep cause of bp readings concern . Planning to inc celexa  Add ativan if needed and  If bp still up then  We can alter medication

## 2016-02-16 NOTE — Telephone Encounter (Signed)
Agree with  documtation

## 2016-02-16 NOTE — Telephone Encounter (Signed)
Patient walked into office today with complaints of "High blood pressure." Patient states last night she couldn't sleep, felt dizzy, and around 4:30am, she went for a walk. The walk did help but she still couldn't fall asleep. 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 Caregiver role restrain was evident. Patient also mentioned concern of panic attack  ? Patient appeared anxious, stating "I just don't know what to do" x2 during triage.  Vitals during triage: BP: 140/72   HR: 83   O2: 98% RA  Patient saw PCP 02/14/16 addressing blood pressure.   Spoke with Dr. Regis Bill and she advised the following: I. Increase Celexa to 10 mg 1/day II. Add on Lorazepam 0.5mg -1mg  three times daily as needed for Anxiety   III. Blood pressure check only in the morning   Send in results of blood pressure readings via mychart  If blood pressure remains high, call in for appointment to adjust medication   Patient verbalized understanding and aware to follow-up as she needs to.  Will route message to Dr. Regis Bill to second patient interaction and instructions.

## 2016-02-17 ENCOUNTER — Ambulatory Visit (INDEPENDENT_AMBULATORY_CARE_PROVIDER_SITE_OTHER): Payer: Medicare Other | Admitting: Cardiovascular Disease

## 2016-02-17 ENCOUNTER — Encounter: Payer: Self-pay | Admitting: Cardiovascular Disease

## 2016-02-17 VITALS — BP 121/64 | HR 81 | Ht 63.0 in | Wt 137.6 lb

## 2016-02-17 DIAGNOSIS — E785 Hyperlipidemia, unspecified: Secondary | ICD-10-CM | POA: Diagnosis not present

## 2016-02-17 DIAGNOSIS — R079 Chest pain, unspecified: Secondary | ICD-10-CM | POA: Diagnosis not present

## 2016-02-17 MED ORDER — ROSUVASTATIN CALCIUM 5 MG PO TABS: 5.0000 mg | ORAL_TABLET | Freq: Every day | ORAL | 5 refills | Status: DC

## 2016-02-17 NOTE — Patient Instructions (Addendum)
Medication Instructions:  START ROSUVASTATIN (CRESTOR) 5 MG DAILY  Labwork: FASTING LP/CMET AT [facility] ON FIRST FLOOR. LABS TO BE DONE IN ABOUT 6 WEEKS  Testing/Procedures: Your physician has requested that you have en exercise stress myoview. For further information please visit [email or web address]. Please follow instruction sheet, as given.  Follow-Up: Your physician wants you to follow-up in: 6 WEEKS   If you need a refill on your cardiac medications before your next appointment, please call your pharmacy.

## 2016-02-20 ENCOUNTER — Encounter: Payer: Self-pay | Admitting: Internal Medicine

## 2016-02-21 NOTE — Telephone Encounter (Signed)
Interesting that your bp reading was better in the cardiology office .  We can  considier adjusting your medicine but   May not be necessary yet .   Will send information to  Dr Oval Linsey  Also and see if she advises alterating in medication at this time.  I

## 2016-02-22 NOTE — Telephone Encounter (Signed)
Please  Please inform patient  And send in  Increase dose of medication valsartan 320/25 1 po qd  Disp 30 refill x 3   And  Stop the  160/25   (advice with dr Oval Linsey )

## 2016-02-23 MED ORDER — VALSARTAN-HYDROCHLOROTHIAZIDE 320-25 MG PO TABS: 1.0000 | ORAL_TABLET | Freq: Every day | ORAL | 0 refills | Status: DC

## 2016-02-25 ENCOUNTER — Telehealth (HOSPITAL_COMMUNITY): Payer: Self-pay

## 2016-02-25 NOTE — Telephone Encounter (Signed)
Encounter complete. 

## 2016-03-01 ENCOUNTER — Ambulatory Visit (HOSPITAL_COMMUNITY)
Admission: RE | Admit: 2016-03-01 | Discharge: 2016-03-01 | Disposition: A | Discharge status: Home / Self Care | Payer: Medicare Other | Source: Ambulatory Visit | Attending: Cardiovascular Disease | Admitting: Cardiovascular Disease

## 2016-03-01 DIAGNOSIS — R5383 Other fatigue: Secondary | ICD-10-CM | POA: Insufficient documentation

## 2016-03-01 DIAGNOSIS — I10 Essential (primary) hypertension: Secondary | ICD-10-CM | POA: Diagnosis not present

## 2016-03-01 DIAGNOSIS — R079 Chest pain, unspecified: Secondary | ICD-10-CM

## 2016-03-01 DIAGNOSIS — Z87891 Personal history of nicotine dependence: Secondary | ICD-10-CM | POA: Insufficient documentation

## 2016-03-01 DIAGNOSIS — Z8249 Family history of ischemic heart disease and other diseases of the circulatory system: Secondary | ICD-10-CM | POA: Insufficient documentation

## 2016-03-01 DIAGNOSIS — R0602 Shortness of breath: Secondary | ICD-10-CM | POA: Diagnosis not present

## 2016-03-01 LAB — MYOCARDIAL PERFUSION IMAGING
CHL CUP NUCLEAR SDS: 0
CHL CUP NUCLEAR SRS: 1
CHL CUP NUCLEAR SSS: 1
CHL CUP RESTING HR STRESS: 60 {beats}/min
CSEPED: 9 min
CSEPEDS: 0 s
CSEPEW: 10.1 METS
CSEPHR: 93 %
CSEPPHR: 144 {beats}/min
LV sys vol: 13 mL
LVDIAVOL: 50 mL (ref 46–106)
MPHR: 154 {beats}/min
RPE: 17
TID: 0.83

## 2016-03-01 MED ORDER — TECHNETIUM TC 99M TETROFOSMIN IV KIT
10.1000 | PACK | Freq: Once | INTRAVENOUS | Status: AC | PRN
  Administered 2016-03-01: 10.1 via INTRAVENOUS
  Filled 2016-03-01: qty 11

## 2016-03-01 MED ORDER — TECHNETIUM TC 99M TETROFOSMIN IV KIT
31.6000 | PACK | Freq: Once | INTRAVENOUS | Status: AC | PRN
  Administered 2016-03-01: 31.6 via INTRAVENOUS
  Filled 2016-03-01: qty 32

## 2016-03-02 ENCOUNTER — Encounter: Payer: Self-pay | Admitting: Internal Medicine

## 2016-03-07 ENCOUNTER — Encounter: Payer: Self-pay | Admitting: Internal Medicine

## 2016-03-14 DIAGNOSIS — Z471 Aftercare following joint replacement surgery: Secondary | ICD-10-CM | POA: Diagnosis not present

## 2016-03-14 DIAGNOSIS — Z96652 Presence of left artificial knee joint: Secondary | ICD-10-CM | POA: Diagnosis not present

## 2016-03-16 NOTE — Progress Notes (Signed)
Pre visit review using our clinic review tool, if applicable. No additional management support is needed unless otherwise documented below in the visit note.  Chief Complaint  Patient presents with  . Follow-up    HPI: Jacqueline Myers 65 y.o.  Fu med  Anxiety and ht   And Chronic disease management    BP  Stop checking cause of swinging readings .  Causing anxious .   Did have a stress tests and was ok .  Usually ok sometimes 140s   Took 1/2  Cyclobenzaprine  With some help . At night and for muscle spasm   LIPIDS: Mild   Leg aching  At night . But can tolerate if working to have lab per dr Oval Linsey in a bit   Anxiety better   Still cant go back to sleep if wakens at 4 am  No more obsessive thoughts of dying  No recent ativan use .  ROS: See pertinent positives and negatives per HPI.  Past Medical History:  Diagnosis Date  . Colon polyps    hyperplastic  . Diverticulosis   . Esophageal stricture   . GERD (gastroesophageal reflux disease)   . History of bronchitis   . Hyperlipidemia   . Hypertension   . Mitral valve prolapse   . Osteoarthritis   . Osteoporosis   . Spinal stenosis     Family History  Problem Relation Age of Onset  . Breast cancer Sister   . Alcohol abuse Mother   . Stroke Mother   . Hypertension Mother   . Diabetes Mother   . Hyperlipidemia Mother   . Hyperlipidemia Father   . Colon cancer Neg Hx     Social History   Social History  . Marital status: Married    Spouse name: N/A  . Number of children: 2  . Years of education: N/A   Occupational History  . Retired    Social History Main Topics  . Smoking status: Former Smoker    Packs/day: 1.00    Years: 29.00    Types: Cigarettes    Quit date: 05/09/1999  . Smokeless tobacco: Never Used  . Alcohol use 0.0 oz/week     Comment: OCC- GLASS OF WINE  . Drug use: No  . Sexual activity: Not Asked   Other Topics Concern  . None   Social History Narrative   Married   HH of 2 helps  take care 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   Neg td neg ets    Outpatient Medications Prior to Visit  Medication Sig Dispense Refill  . acetaminophen (TYLENOL) 500 MG tablet Take 500-1,000 mg by mouth every 6 (six) hours as needed for moderate pain.    Marland Kitchen LORazepam (ATIVAN) 1 MG tablet Take 0.5-1 tablets (0.5-1 mg total) by mouth every 8 (eight) hours as needed for anxiety. 15 tablet 0  . NON FORMULARY CALCIUM 1200 ER AND VITAMIN D    . Omega-3 Fatty Acids (FISH OIL PO) Take 2 capsules by mouth daily.    . rosuvastatin (CRESTOR) 5 MG tablet Take 1 tablet (5 mg total) by mouth daily. 30 tablet 5  . citalopram (CELEXA) 10 MG tablet Take 0.5 tablets (5 mg total) by mouth daily. Increase to 10 mg per day after 1-2 weeks 30 tablet 1  . cyclobenzaprine (FLEXERIL) 10 MG tablet TAKE  ONE-HALF TO ONE TABLET BY MOUTH THREE TIMES DAILY AS NEEDED FOR MUSCLE SPASM. (Patient taking differently: Take 5-10 mg by mouth at bedtime as needed for muscle spasms. TAKE ONE-HALF TO ONE TABLET BY MOUTH THREE TIMES DAILY AS NEEDED FOR MUSCLE SPASM.) 90 tablet 0  . valsartan-hydrochlorothiazide (DIOVAN-HCT) 320-25 MG tablet Take 1 tablet by mouth daily. 90 tablet 0   No facility-administered medications prior to visit.      EXAM:  BP 132/60 (BP Location: Right Arm, Patient Position: Sitting, Cuff Size: Normal)   Temp 98.4 F (36.9 C) (Oral)   Ht 5\' 3"  (1.6 m)   Wt 141 lb 3.2 oz (64 kg)   BMI 25.01 kg/m   Body mass index is 25.01 kg/m.  GENERAL: vitals reviewed and listed above, alert, oriented, appears well hydrated and in no acute distress HEENT: atraumatic, conjunctiva  clear, no obvious abnormalities on inspection of external nose and ears  NECK: no obvious masses on inspection palpation  bp rechecked 130/62 CV: HRRR, no clubbing cyanosis or  peripheral edema nl cap refill    MS: moves all extremities without noticeable focal  abnormality PSYCH: pleasant and cooperative, no obvious depression or anxiety Lab Results  Component Value Date   WBC 8.4 06/21/2015   HGB 13.9 06/21/2015   HCT 42.7 06/21/2015   PLT 291.0 06/21/2015   GLUCOSE 100 (H) 06/21/2015   CHOL 228 (H) 06/21/2015   TRIG 130.0 06/21/2015   HDL 58.00 06/21/2015   LDLDIRECT 127.6 06/23/2013   LDLCALC 144 (H) 06/21/2015   ALT 21 06/21/2015   AST 21 06/21/2015   NA 142 06/21/2015   K 4.2 06/21/2015   CL 105 06/21/2015   CREATININE 0.74 06/21/2015   BUN 21 06/21/2015   CO2 29 06/21/2015   TSH 3.87 06/21/2015   INR 0.93 04/08/2015   Wt Readings from Last 3 Encounters:  03/17/16 141 lb 3.2 oz (64 kg)  03/01/16 137 lb (62.1 kg)  02/17/16 137 lb 9.6 oz (62.4 kg)   BP Readings from Last 3 Encounters:  03/17/16 132/60  02/17/16 121/64  02/14/16 136/76    ASSESSMENT AND PLAN:  Discussed the following assessment and plan:  Essential hypertension - better  anxiety aggravated at times to get cmp in near future refill med today  Anxiety state - improved inc to 15 mg trial  Sleep disturbance - as per above   Medication management  Hyperlipidemia, unspecified hyperlipidemia type - on low dose statin med  per cards   Refill flexeril caution but using mostly at night   -Patient advised to return or notify health care team  if symptoms worsen ,persist or new concerns arise.  Patient Instructions  Trial inc citalopram to 15 mg per day   Same bp med doing well with readings in the office.   Have cardiology fu with statin med until  Stable  Dosing and then we can take over as appropriate.  Keep cpx check up appt in feb   Kamoni Gentles K. Dunbar Buras M.D.

## 2016-03-17 ENCOUNTER — Encounter: Payer: Self-pay | Admitting: Internal Medicine

## 2016-03-17 ENCOUNTER — Telehealth: Payer: Self-pay

## 2016-03-17 ENCOUNTER — Ambulatory Visit (INDEPENDENT_AMBULATORY_CARE_PROVIDER_SITE_OTHER): Payer: Medicare Other | Admitting: Internal Medicine

## 2016-03-17 VITALS — BP 132/60 | Temp 98.4°F | Ht 63.0 in | Wt 141.2 lb

## 2016-03-17 DIAGNOSIS — Z79899 Other long term (current) drug therapy: Secondary | ICD-10-CM | POA: Diagnosis not present

## 2016-03-17 DIAGNOSIS — G479 Sleep disorder, unspecified: Secondary | ICD-10-CM | POA: Diagnosis not present

## 2016-03-17 DIAGNOSIS — I10 Essential (primary) hypertension: Secondary | ICD-10-CM

## 2016-03-17 DIAGNOSIS — E785 Hyperlipidemia, unspecified: Secondary | ICD-10-CM

## 2016-03-17 DIAGNOSIS — F411 Generalized anxiety disorder: Secondary | ICD-10-CM

## 2016-03-17 MED ORDER — CYCLOBENZAPRINE HCL 10 MG PO TABS: ORAL_TABLET | ORAL | 3 refills | Status: DC

## 2016-03-17 MED ORDER — VALSARTAN-HYDROCHLOROTHIAZIDE 320-25 MG PO TABS: 1.0000 | ORAL_TABLET | Freq: Every day | ORAL | 3 refills | Status: DC

## 2016-03-17 MED ORDER — CITALOPRAM HYDROBROMIDE 10 MG PO TABS: 15.0000 mg | ORAL_TABLET | Freq: Every day | ORAL | 1 refills | Status: DC

## 2016-03-17 NOTE — Patient Instructions (Signed)
Trial inc citalopram to 15 mg per day   Same bp med doing well with readings in the office.   Have cardiology fu with statin med until  Stable  Dosing and then we can take over as appropriate.  Keep cpx check up appt in feb

## 2016-03-17 NOTE — Telephone Encounter (Signed)
Received fax from [facility] asking for clarification on the directions for the Citalopram 10mg  Rx. After talking with Dr. Regis Bill, the directions are suppose to be Take 1.5 tablets by mouth daily. Called and spoke with pharmacy & they verbalized understanding.

## 2016-03-18 ENCOUNTER — Other Ambulatory Visit: Payer: Self-pay | Admitting: Internal Medicine

## 2016-03-20 NOTE — Telephone Encounter (Signed)
Denied.  Filled on 03/17/16 for 90 day.  Message sent to the pharmacy to check file.

## 2016-03-24 ENCOUNTER — Telehealth: Payer: Self-pay

## 2016-03-24 NOTE — Telephone Encounter (Signed)
Received PA request from [facility] for Citalopram 10 mg tablets. PA submitted & is pending. Key: [id number]

## 2016-03-27 DIAGNOSIS — E785 Hyperlipidemia, unspecified: Secondary | ICD-10-CM | POA: Diagnosis not present

## 2016-03-27 NOTE — Telephone Encounter (Signed)
PA approved, form faxed back to pharmacy. 

## 2016-03-28 LAB — COMPREHENSIVE METABOLIC PANEL
ALBUMIN: 4.2 g/dL (ref 3.6–5.1)
ALK PHOS: 75 U/L (ref 33–130)
ALT: 18 U/L (ref 6–29)
AST: 20 U/L (ref 10–35)
BUN: 16 mg/dL (ref 7–25)
CHLORIDE: 104 mmol/L (ref 98–110)
CO2: 29 mmol/L (ref 20–31)
CREATININE: 0.79 mg/dL (ref 0.50–0.99)
Calcium: 9.4 mg/dL (ref 8.6–10.4)
Glucose, Bld: 93 mg/dL (ref 65–99)
Potassium: 4.3 mmol/L (ref 3.5–5.3)
SODIUM: 140 mmol/L (ref 135–146)
TOTAL PROTEIN: 6.6 g/dL (ref 6.1–8.1)
Total Bilirubin: 0.5 mg/dL (ref 0.2–1.2)

## 2016-03-28 LAB — LIPID PANEL
Cholesterol: 158 mg/dL (ref <200)
HDL: 60 mg/dL (ref >50)
LDL Cholesterol: 74 mg/dL (ref <100)
TRIGLYCERIDES: 121 mg/dL (ref <150)
Total CHOL/HDL Ratio: 2.6 Ratio (ref <5.0)
VLDL: 24 mg/dL (ref <30)

## 2016-03-29 ENCOUNTER — Encounter: Payer: Self-pay | Admitting: *Deleted

## 2016-04-02 NOTE — Progress Notes (Signed)
Cardiology Office Note   Date:  04/03/2016   ID:  Jacqueline Myers, DOB Mar 29, 1951, MRN [id number]  PCP:  Lottie Dawson, MD  Cardiologist:   Skeet Latch, MD   Chief Complaint  Myers presents with  . Follow-up    pt has no complaints today   Using Jacqueline 30th 1941   History of Present Illness: Jacqueline Myers is a 65 y.o. female with hypertension, hyperlipidemia, and mitral valve prolapse who presents for follow up on chest pain.  Jacqueline Myers saw Dr. Shanon Ace on 02/14/16 and reported episodes of chest pain with exertion.  She was evaluated in clinic 02/17/16 and underwent exercise Myoview 10/25 that was negative for ischemia and revealed LVEF 75%.  She has also been working with Dr. Regis Bill to better treat her anxiety. She wonders if this may be contributing.  She increased her citalopram an no longer has chest pain.  She has also been able to sleep better at night.  Jacqueline Myers has previously taken simvastatin but didn't tolerate it due to myalgias.  She was started on rosuvastatin 02/2016.  She denies any recurrent myalgias.  She continues to exercise regularly.  Past Medical History:  Diagnosis Date  . Colon polyps    hyperplastic  . Diverticulosis   . Esophageal stricture   . GERD (gastroesophageal reflux disease)   . History of bronchitis   . Hyperlipidemia   . Hypertension   . Mitral valve prolapse   . Osteoarthritis   . Osteoporosis   . Spinal stenosis     Past Surgical History:  Procedure Laterality Date  . COLONOSCOPY    . NM MYOVIEW LTD  08/2006   normal for abn ekg  . TONSILLECTOMY    . TOTAL KNEE ARTHROPLASTY Left 04/14/2015   Procedure: LEFT TOTAL KNEE ARTHROPLASTY;  Surgeon: Gaynelle Arabian, MD;  Location: [facility];  Service: Orthopedics;  Laterality: Left;     Current Outpatient Prescriptions  Medication Sig Dispense Refill  . acetaminophen (TYLENOL) 500 MG tablet Take 500-1,000 mg by mouth every 6 (six) hours as needed for  moderate pain.    . citalopram (CELEXA) 10 MG tablet Take 1.5 tablets (15 mg total) by mouth daily. Increase to 10 mg per day after 1-2 weeks (Myers taking differently: Take 15 mg by mouth daily. ) 135 tablet 1  . cyclobenzaprine (FLEXERIL) 10 MG tablet TAKE ONE-HALF TO ONE TABLET BY MOUTH THREE TIMES DAILY AS NEEDED FOR MUSCLE SPASM. (Myers taking differently: Take 10 mg by mouth daily. TAKE ONE-HALF TO ONE TABLET BY MOUTH THREE TIMES DAILY AS NEEDED FOR MUSCLE SPASM.) 30 tablet 3  . LORazepam (ATIVAN) 1 MG tablet Take 0.5-1 tablets (0.5-1 mg total) by mouth every 8 (eight) hours as needed for anxiety. 15 tablet 0  . NON FORMULARY CALCIUM 1200 ER AND VITAMIN D    . Omega-3 Fatty Acids (FISH OIL PO) Take 2 capsules by mouth daily.    . rosuvastatin (CRESTOR) 5 MG tablet Take 1 tablet (5 mg total) by mouth daily. 30 tablet 5  . valsartan-hydrochlorothiazide (DIOVAN-HCT) 320-25 MG tablet Take 1 tablet by mouth daily. 90 tablet 3   No current facility-administered medications for this visit.     Allergies:   Lisinopril; Oxycodone; and Simvastatin    Social History:  Jacqueline Myers  reports that she quit smoking about 16 years ago. Her smoking use included Cigarettes. She has a 29.00 pack-year smoking history. She has never used smokeless tobacco. She reports that she  drinks alcohol. She reports that she does not use drugs.   Family History:  Jacqueline Myers's family history includes Alcohol abuse in her mother; Breast cancer in her sister; Diabetes in her mother; Hyperlipidemia in her father and mother; Hypertension in her mother; Stroke in her mother.    ROS:  Please see Jacqueline history of present illness.   Otherwise, review of systems are positive for none.   All other systems are reviewed and negative.    PHYSICAL EXAM: VS:  BP 120/70 (BP Location: Right Arm, Myers Position: Sitting, Cuff Size: Normal)   Pulse 82   Ht 5\' 3"  (1.6 m)   Wt 63 kg (138 lb 12.8 oz)   SpO2 97%   BMI 24.59 kg/m  ,  BMI Body mass index is 24.59 kg/m. GENERAL:  Well appearing HEENT:  Pupils equal round and reactive, fundi not visualized, oral mucosa unremarkable NECK:  No jugular venous distention, waveform within normal limits, carotid upstroke brisk and symmetric, no bruits  LYMPHATICS:  No cervical adenopathy LUNGS:  Clear to auscultation bilaterally HEART:  RRR.  PMI not displaced or sustained,S1 and S2 within normal limits, no S3, no S4, no clicks, no rubs, no murmurs ABD:  Flat, positive bowel sounds normal in frequency in pitch, no bruits, no rebound, no guarding, no midline pulsatile mass, no hepatomegaly, no splenomegaly EXT:  2 plus pulses throughout, no edema, no cyanosis no clubbing SKIN:  No rashes no nodules NEURO:  Cranial nerves II through XII grossly intact, motor grossly intact throughout PSYCH:  Cognitively intact, oriented to person place and time   EKG:  EKG is not ordered today. Jacqueline ekg ordered 02/17/16 demonstrates Sinus rhythm. Rate 81 bpm. Incomplete right bundle branch block. Cannot rule out prior septal infarct.  Lexiscan Myoview 03/01/16:  Jacqueline left ventricular ejection fraction is hyperdynamic (>65%).  Nuclear stress EF: 75%.  Jacqueline study is normal.  This is a low risk study.  Blood pressure demonstrated a normal response to exercise.  There was no ST segment deviation noted during stress.   Recent Labs: 04/24/2015: Magnesium 1.7 06/21/2015: Hemoglobin 13.9; Platelets 291.0; TSH 3.87 03/27/2016: ALT 18; BUN 16; Creat 0.79; Potassium 4.3; Sodium 140    Lipid Panel    Component Value Date/Time   CHOL 158 03/27/2016 1021   TRIG 121 03/27/2016 1021   TRIG 145 04/06/2006 0808   HDL 60 03/27/2016 1021   CHOLHDL 2.6 03/27/2016 1021   VLDL 24 03/27/2016 1021   LDLCALC 74 03/27/2016 1021   LDLDIRECT 127.6 06/23/2013 0807      Wt Readings from Last 3 Encounters:  04/03/16 63 kg (138 lb 12.8 oz)  03/17/16 64 kg (141 lb 3.2 oz)  03/01/16 62.1 kg (137 lb)       ASSESSMENT AND PLAN:  # Chest pain:  Resolved and stress test was negative.  Continue aspirin 81 mg daily.  # Hyperlipidemia: ASCVD 10 year risk is 7.7%.  She has tolerated rosuvastatin well and had significant improvement in her lipids with 5mg .  Continue rosuvastatin.  LFTs are within normal limits.     Current medicines are reviewed at length with Jacqueline Myers today.  Jacqueline Myers does not have concerns regarding medicines.  Jacqueline following changes have been made: None  Labs/ tests ordered today include:   No orders of Jacqueline defined types were placed in this encounter.    Disposition:   FU with Timber Lucarelli C. Oval Linsey, MD, [facility] as needed. .    This note was  written with Jacqueline assistance of speech recognition software.  Please excuse any transcriptional errors.  Signed, Barry Culverhouse C. Oval Linsey, MD, [facility]  04/03/2016 9:04 AM    [place]

## 2016-04-03 ENCOUNTER — Encounter: Payer: Self-pay | Admitting: Cardiovascular Disease

## 2016-04-03 ENCOUNTER — Ambulatory Visit (INDEPENDENT_AMBULATORY_CARE_PROVIDER_SITE_OTHER): Payer: Medicare Other | Admitting: Cardiovascular Disease

## 2016-04-03 VITALS — BP 120/70 | HR 82 | Ht 63.0 in | Wt 138.8 lb

## 2016-04-03 DIAGNOSIS — R0789 Other chest pain: Secondary | ICD-10-CM

## 2016-04-03 DIAGNOSIS — E7800 Pure hypercholesterolemia, unspecified: Secondary | ICD-10-CM

## 2016-04-03 NOTE — Patient Instructions (Signed)
Medication Instructions:  .Your physician recommends that you continue on your current medications as directed. Please refer to the Current Medication list given to you today.  Labwork: none  Testing/Procedures: none  Follow-Up: As needed   

## 2016-04-13 ENCOUNTER — Encounter: Payer: Self-pay | Admitting: Internal Medicine

## 2016-04-14 MED ORDER — ROSUVASTATIN CALCIUM 5 MG PO TABS: 5.0000 mg | ORAL_TABLET | Freq: Every day | ORAL | 3 refills | Status: DC

## 2016-04-14 NOTE — Telephone Encounter (Signed)
Misty ok to take over the crestor   5 mg per day  Can disp 90 refill x 3

## 2016-05-09 ENCOUNTER — Encounter: Payer: Self-pay | Admitting: Internal Medicine

## 2016-05-10 NOTE — Telephone Encounter (Signed)
She could try mucinex dm or plain  But  She has appt today and if coming can evaluate at this time.

## 2016-05-15 ENCOUNTER — Encounter: Payer: Self-pay | Admitting: Internal Medicine

## 2016-05-15 NOTE — Telephone Encounter (Signed)
X ray shows some  Fluid left small and poss  Resolving pneumionia vs  atelecteisi  On the left    If not getting better   We can change antibiotic     Her med list says ofloxin  caused problem after eye surgery but that doesn't sound like allergic reaction    If was from eye drops   Shouldn't be an issue. Let us know if more than  What is stated    Would change her to levaquin 500 mg 1 po qd for 7 days     ROV at end of  Medication or if worse .

## 2016-05-17 ENCOUNTER — Encounter: Payer: Self-pay | Admitting: Internal Medicine

## 2016-06-04 ENCOUNTER — Encounter: Payer: Self-pay | Admitting: Internal Medicine

## 2016-06-05 NOTE — Telephone Encounter (Signed)
As long as she doesn't have a fever  Or pneumonia signs can refill prednisone x 1

## 2016-06-05 NOTE — Telephone Encounter (Signed)
Dr. Regis Bill please advise as to request for refill on oral prednisone.   Thank you!

## 2016-06-19 NOTE — Progress Notes (Signed)
Chief Complaint  Patient presents with  . Medicare Wellness    HPI: Jacqueline Myers 66 y.o. comes in today for Preventive Medicare wellness visit  And Chronic disease management .Since last visit. Doing ok    Anxiety  Citalopram 15 mg   Doing ok  Still 5 hours sleep and  "Brain" isses  ocass use small lorazepam has a few left asks for refill if needed   BP had been better  140 - 150 no se of med   Cards eval anxiety  No alarm issues  On a prn visit  Need   LIPIDS atherosclerosis   Tolerating crestor 5 ok ,.    Health Maintenance  Topic Date Due  . Hepatitis C Screening  [birth year]  . MAMMOGRAM  07/11/2017  . COLONOSCOPY  01/17/2023  . TETANUS/TDAP  07/03/2024  . INFLUENZA VACCINE  Completed  . DEXA SCAN  Completed  . ZOSTAVAX  Completed  . PNA vac Low Risk Adult  Completed   Health Maintenance Review LIFESTYLE:  TADsoc Sugar beverages:n Sleep: 4-5   MEDICARE DOCUMENT QUESTIONS  TO SCAN   Hearing: ok  Vision:  Blurry issues  Had lasik and cataracts ahd lense inplants  To see eye doc  SAFEfety:  Has smoke detector and wears seat belts.  No firearms. No excess sun exposure. Sees dentist regularly.  Falls: n  Advance directive :  Reviewed  Has one.  Memory: Felt to be good  , no concern from her or her family.  Depression: No anhedonia unusual crying or depressive symptoms  Nutrition: Eats well balanced diet; adequate calcium and vitamin D. No swallowing chewing problems.  Injury: no major injuries in the last six months.  Other healthcare providers:  Reviewed today .  Social:  Lives with spouse married. .  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 She is independent.  EXERCISE/ HABITS  Per week   No tobacco    etoh   ROS: see above  GEN/ HEENT: No fever,  significant weight changes sweats headaches vision problems hearing changes, CV/ PULM; No chest pain shortness of breath cough, syncope,edema  change in exercise tolerance. GI /GU: No adominal pain, vomiting, change in bowel habits. No blood in the stool. No significant GU symptoms. SKIN/HEME: ,no acute skin rashes suspicious lesions or bleeding. No lymphadenopathy, nodules, masses.  NEURO/ PSYCH:  No neurologic signs such as weakness numbness. No depression anxiety. IMM/ Allergy: No unusual infections.  Allergy .   REST of 12 system review negative except as per HPI   Past Medical History:  Diagnosis Date  . Colon polyps    hyperplastic  . Diverticulosis   . Esophageal stricture   . GERD (gastroesophageal reflux disease)   . History of bronchitis   . Hyperlipidemia   . Hypertension   . Mitral valve prolapse   . Osteoarthritis   . Osteoporosis   . Spinal stenosis     Family History  Problem Relation Age of Onset  . Breast cancer Sister   . Alcohol abuse Mother   . Stroke Mother   . Hypertension Mother   . Diabetes Mother   . Hyperlipidemia Mother   . Hyperlipidemia Father   . Colon cancer Neg Hx     Social History   Social History  .  Marital status: Married    Spouse name: N/A  . Number of children: 2  . Years of education: N/A   Occupational History  . Retired    Social History Main Topics  . Smoking status: Former Smoker    Packs/day: 1.00    Years: 29.00    Types: Cigarettes    Quit date: 05/09/1999  . Smokeless tobacco: Never Used  . Alcohol use 0.0 oz/week     Comment: OCC- GLASS OF WINE  . Drug use: No  . Sexual activity: Not Asked   Other Topics Concern  . None   Social History Narrative   Married   HH of 2 helps take care 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   Neg td neg ets    Outpatient Encounter Prescriptions as of  06/20/2016  Medication Sig  . acetaminophen (TYLENOL) 500 MG tablet Take 500-1,000 mg by mouth every 6 (six) hours as needed for moderate pain.  Marland Kitchen aspirin EC 81 MG tablet Take 81 mg by mouth daily.  . citalopram (CELEXA) 10 MG tablet Take 1.5 tablets (15 mg total) by mouth daily. Increase to 10 mg per day after 1-2 weeks (Patient taking differently: Take 15 mg by mouth daily. )  . cyclobenzaprine (FLEXERIL) 10 MG tablet TAKE ONE-HALF TO ONE TABLET BY MOUTH THREE TIMES DAILY AS NEEDED FOR MUSCLE SPASM. (Patient taking differently: Take 10 mg by mouth daily. TAKE ONE-HALF TO ONE TABLET BY MOUTH THREE TIMES DAILY AS NEEDED FOR MUSCLE SPASM.)  . LORazepam (ATIVAN) 1 MG tablet Take 0.5-1 tablets (0.5-1 mg total) by mouth every 8 (eight) hours as needed for anxiety.  . rosuvastatin (CRESTOR) 5 MG tablet Take 1 tablet (5 mg total) by mouth daily. PLEASE GIVE 30 DAY SUPPLY UPON PATIENT REQUEST  . valsartan-hydrochlorothiazide (DIOVAN-HCT) 320-25 MG tablet Take 1 tablet by mouth daily.  . [DISCONTINUED] LORazepam (ATIVAN) 1 MG tablet Take 0.5-1 tablets (0.5-1 mg total) by mouth every 8 (eight) hours as needed for anxiety.  . [DISCONTINUED] NON FORMULARY CALCIUM 1200 ER AND VITAMIN D  . [DISCONTINUED] Omega-3 Fatty Acids (FISH OIL PO) Take 2 capsules by mouth daily.   No facility-administered encounter medications on file as of 06/20/2016.     EXAM:  BP 140/70 (BP Location: Right Arm, Patient Position: Sitting, Cuff Size: Normal)   Temp 98.4 F (36.9 C) (Oral)   Ht 5\' 3"  (1.6 m)   Wt 141 lb (64 kg)   BMI 24.98 kg/m   Body mass index is 24.98 kg/m.  Physical Exam: Vital signs reviewed [id number] is a well-developed well-nourished alert cooperative   who appears stated age in no acute distress.  HEENT: normocephalic atraumatic , Eyes: PERRL EOM's full, conjunctiva clear,  Glasses Nares: paten,t no deformity discharge or tenderness., Ears: no deformity EAC's clear TMs with normal landmarks. Mouth:  clear OP, no lesions, edema.  Moist mucous membranes. Dentition in adequate repair. NECK: supple without masses, thyromegaly or bruits. CHEST/PULM:  Clear to auscultation and percussion breath sounds equal no wheeze , rales or rhonchi. No chest wall deformities or tenderness.Breast: normal by inspection . No dimpling, discharge, masses, tenderness or discharge . CV: PMI is nondisplaced, S1 S2 no gallops, murmurs, rubs. Peripheral pulses are full without delay.No JVD .  ABDOMEN: Bowel sounds normal nontender  No guard or rebound, no hepato splenomegal no CVA tenderness.  Extremtities:  No clubbing cyanosis or edema, no acute joint swelling or redness no focal atrophy NEURO:  Oriented x3, cranial nerves 3-12 appear to be intact, no obvious focal weakness,gait within normal limits no abnormal reflexes or asymmetrical SKIN: No acute rashes normal turgor, color, no bruising or petechiae. PSYCH: Oriented, good eye contact, no obvious depression anxiety, cognition and judgment appear normal. LN: no cervical axillary inguinal adenopathy No noted deficits in memory, attention, and speech.   Lab Results  Component Value Date   WBC 8.4 06/21/2015   HGB 13.9 06/21/2015   HCT 42.7 06/21/2015   PLT 291.0 06/21/2015   GLUCOSE 93 03/27/2016   CHOL 158 03/27/2016   TRIG 121 03/27/2016   HDL 60 03/27/2016   LDLDIRECT 127.6 06/23/2013   LDLCALC 74 03/27/2016   ALT 18 03/27/2016   AST 20 03/27/2016   NA 140 03/27/2016   K 4.3 03/27/2016   CL 104 03/27/2016   CREATININE 0.79 03/27/2016   BUN 16 03/27/2016   CO2 29 03/27/2016   TSH 3.87 06/21/2015   INR 0.93 04/08/2015   Wt Readings from Last 3 Encounters:  06/20/16 141 lb (64 kg)  04/03/16 138 lb 12.8 oz (63 kg)  03/17/16 141 lb 3.2 oz (64 kg)     ASSESSMENT AND PLAN:  Discussed the following assessment and plan:  Medicare annual wellness visit, subsequent  Medication management  Essential hypertension - Plan: Basic metabolic panel,  Hemoglobin A1c, TSH, Lipid panel  Anxiety state - cont med  15 cital prn caustious use of benzo doing much better  - Plan: TSH  Atherosclerosis of aorta (HCC) - Plan: Basic metabolic panel, Hemoglobin A1c, TSH, Lipid panel  Hyperlipidemia, unspecified hyperlipidemia type - Plan: Basic metabolic panel, Hemoglobin A1c, TSH, Lipid panel  Fasting hyperglycemia - Plan: Basic metabolic panel, Hemoglobin A1c, TSH, Lipid panel  Other problems related to lifestyle - Plan: Hepatitis C antibody  Need for 23-polyvalent pneumococcal polysaccharide vaccine - Plan: Pneumococcal polysaccharide vaccine 23-valent greater than or equal to 2yo subcutaneous/IM revewed   Record   Stable at this point   rov in 6 months  Or as needed    Anxiety control  Pt aware  Will restart exercise  weather permitting Patient Care Team: Burnis Medin, MD as PCP - General Almedia Balls, MD (Orthopedic Surgery) Irene Shipper, MD (Gastroenterology) Gaynelle Arabian, MD as Consulting Physician (Orthopedic Surgery)  Patient Instructions   Will notify you  of labs when available.   If all ok then ROV in 6 months med check  BP Readings from Last 3 Encounters:  06/20/16 140/70  04/03/16 120/70  03/17/16 132/60  Bp goal below  140/90 or less   Mammogram  Pneumovax today  utd on other immunizations.     Exercising to Stay Healthy Introduction Exercising regularly is important. It has many health benefits, such as:  Improving your overall fitness, flexibility, and endurance.  Increasing your bone density.  Helping with weight control.  Decreasing your body fat.  Increasing your muscle strength.  Reducing stress and tension.  Improving your overall health. In order to become healthy and stay healthy, it is recommended that you do moderate-intensity and vigorous-intensity exercise. You can tell that you are exercising at a moderate intensity if you have a higher heart rate and faster breathing, but you are still able  to hold a conversation. You can tell that you are exercising at a vigorous intensity if you are breathing much harder and faster and cannot  hold a conversation while exercising. How often should I exercise? Choose an activity that you enjoy and set realistic goals. Your health care provider can help you to make an activity plan that works for you. Exercise regularly as directed by your health care provider. This may include:  Doing resistance training twice each week, such as:  Push-ups.  Sit-ups.  Lifting weights.  Using resistance bands.  Doing a given intensity of exercise for a given amount of time. Choose from these options:  150 minutes of moderate-intensity exercise every week.  75 minutes of vigorous-intensity exercise every week.  A mix of moderate-intensity and vigorous-intensity exercise every week. Children, pregnant women, people who are out of shape, people who are overweight, and older adults may need to consult a health care provider for individual recommendations. If you have any sort of medical condition, be sure to consult your health care provider before starting a new exercise program. What are some exercise ideas? Some moderate-intensity exercise ideas include:  Walking at a rate of 1 mile in 15 minutes.  Biking.  Hiking.  Golfing.  Dancing. Some vigorous-intensity exercise ideas include:  Walking at a rate of at least 4.5 miles per hour.  Jogging or running at a rate of 5 miles per hour.  Biking at a rate of at least 10 miles per hour.  Lap swimming.  Roller-skating or in-line skating.  Cross-country skiing.  Vigorous competitive sports, such as football, basketball, and soccer.  Jumping rope.  Aerobic dancing. What are some everyday activities that can help me to get exercise?  Yard work, such as:  Psychologist, educational.  Raking and bagging leaves.  Washing and waxing your car.  Pushing a stroller.  Shoveling  snow.  Gardening.  Washing windows or floors. How can I be more active in my day-to-day activities?  Use the stairs instead of the elevator.  Take a walk during your lunch break.  If you drive, park your car farther away from work or school.  If you take public transportation, get off one stop early and walk the rest of the way.  Make all of your phone calls while standing up and walking around.  Get up, stretch, and walk around every 30 minutes throughout the day. What guidelines should I follow while exercising?  Do not exercise so much that you hurt yourself, feel dizzy, or get very short of breath.  Consult your health care provider before starting a new exercise program.  Wear comfortable clothes and shoes with good support.  Drink plenty of water while you exercise to prevent dehydration or heat stroke. Body water is lost during exercise and must be replaced.  Work out until you breathe faster and your heart beats faster. This information is not intended to replace advice given to you by your health care provider. Make sure you discuss any questions you have with your health care provider. Document Released: 05/27/2010 Document Revised: 09/30/2015 Document Reviewed: 09/25/2013  2017 Elsevier   If       Standley Brooking. Shoua Ulloa M.D.

## 2016-06-20 ENCOUNTER — Ambulatory Visit (INDEPENDENT_AMBULATORY_CARE_PROVIDER_SITE_OTHER): Payer: Medicare Other | Admitting: Internal Medicine

## 2016-06-20 ENCOUNTER — Encounter: Payer: Self-pay | Admitting: Internal Medicine

## 2016-06-20 VITALS — BP 140/70 | Temp 98.4°F | Ht 63.0 in | Wt 141.0 lb

## 2016-06-20 DIAGNOSIS — E785 Hyperlipidemia, unspecified: Secondary | ICD-10-CM

## 2016-06-20 DIAGNOSIS — R7301 Impaired fasting glucose: Secondary | ICD-10-CM

## 2016-06-20 DIAGNOSIS — Z23 Encounter for immunization: Secondary | ICD-10-CM

## 2016-06-20 DIAGNOSIS — Z7289 Other problems related to lifestyle: Secondary | ICD-10-CM

## 2016-06-20 DIAGNOSIS — Z0000 Encounter for general adult medical examination without abnormal findings: Secondary | ICD-10-CM

## 2016-06-20 DIAGNOSIS — I10 Essential (primary) hypertension: Secondary | ICD-10-CM

## 2016-06-20 DIAGNOSIS — F411 Generalized anxiety disorder: Secondary | ICD-10-CM | POA: Diagnosis not present

## 2016-06-20 DIAGNOSIS — I700 Atherosclerosis of aorta: Secondary | ICD-10-CM | POA: Diagnosis not present

## 2016-06-20 DIAGNOSIS — Z79899 Other long term (current) drug therapy: Secondary | ICD-10-CM | POA: Diagnosis not present

## 2016-06-20 LAB — BASIC METABOLIC PANEL
BUN: 19 mg/dL (ref 6–23)
CALCIUM: 9.4 mg/dL (ref 8.4–10.5)
CO2: 31 mEq/L (ref 19–32)
CREATININE: 0.76 mg/dL (ref 0.40–1.20)
Chloride: 101 mEq/L (ref 96–112)
GFR: 80.79 mL/min (ref >60.00)
GLUCOSE: 92 mg/dL (ref 70–99)
Potassium: 4.1 mEq/L (ref 3.5–5.1)
Sodium: 139 mEq/L (ref 135–145)

## 2016-06-20 LAB — LIPID PANEL
CHOL/HDL RATIO: 3
Cholesterol: 167 mg/dL (ref 0–200)
HDL: 60.6 mg/dL (ref >39.00)
LDL CALC: 74 mg/dL (ref 0–99)
NONHDL: 106.72
Triglycerides: 165 mg/dL — ABNORMAL HIGH (ref 0.0–149.0)
VLDL: 33 mg/dL (ref 0.0–40.0)

## 2016-06-20 LAB — HEMOGLOBIN A1C: HEMOGLOBIN A1C: 6.1 % (ref 4.6–6.5)

## 2016-06-20 LAB — TSH: TSH: 2.14 u[IU]/mL (ref 0.35–4.50)

## 2016-06-20 MED ORDER — LORAZEPAM 1 MG PO TABS: 0.5000 mg | ORAL_TABLET | Freq: Three times a day (TID) | ORAL | 0 refills | Status: DC | PRN

## 2016-06-20 NOTE — Patient Instructions (Addendum)
Will notify you  of labs when available.   If all ok then ROV in 6 months med check  BP Readings from Last 3 Encounters:  06/20/16 140/70  04/03/16 120/70  03/17/16 132/60  Bp goal below  140/90 or less   Mammogram  Pneumovax today  utd on other immunizations.     Exercising to Stay Healthy Introduction Exercising regularly is important. It has many health benefits, such as:  Improving your overall fitness, flexibility, and endurance.  Increasing your bone density.  Helping with weight control.  Decreasing your body fat.  Increasing your muscle strength.  Reducing stress and tension.  Improving your overall health. In order to become healthy and stay healthy, it is recommended that you do moderate-intensity and vigorous-intensity exercise. You can tell that you are exercising at a moderate intensity if you have a higher heart rate and faster breathing, but you are still able to hold a conversation. You can tell that you are exercising at a vigorous intensity if you are breathing much harder and faster and cannot hold a conversation while exercising. How often should I exercise? Choose an activity that you enjoy and set realistic goals. Your health care provider can help you to make an activity plan that works for you. Exercise regularly as directed by your health care provider. This may include:  Doing resistance training twice each week, such as:  Push-ups.  Sit-ups.  Lifting weights.  Using resistance bands.  Doing a given intensity of exercise for a given amount of time. Choose from these options:  150 minutes of moderate-intensity exercise every week.  75 minutes of vigorous-intensity exercise every week.  A mix of moderate-intensity and vigorous-intensity exercise every week. Children, pregnant women, people who are out of shape, people who are overweight, and older adults may need to consult a health care provider for individual recommendations. If you have  any sort of medical condition, be sure to consult your health care provider before starting a new exercise program. What are some exercise ideas? Some moderate-intensity exercise ideas include:  Walking at a rate of 1 mile in 15 minutes.  Biking.  Hiking.  Golfing.  Dancing. Some vigorous-intensity exercise ideas include:  Walking at a rate of at least 4.5 miles per hour.  Jogging or running at a rate of 5 miles per hour.  Biking at a rate of at least 10 miles per hour.  Lap swimming.  Roller-skating or in-line skating.  Cross-country skiing.  Vigorous competitive sports, such as football, basketball, and soccer.  Jumping rope.  Aerobic dancing. What are some everyday activities that can help me to get exercise?  Yard work, such as:  Psychologist, educational.  Raking and bagging leaves.  Washing and waxing your car.  Pushing a stroller.  Shoveling snow.  Gardening.  Washing windows or floors. How can I be more active in my day-to-day activities?  Use the stairs instead of the elevator.  Take a walk during your lunch break.  If you drive, park your car farther away from work or school.  If you take public transportation, get off one stop early and walk the rest of the way.  Make all of your phone calls while standing up and walking around.  Get up, stretch, and walk around every 30 minutes throughout the day. What guidelines should I follow while exercising?  Do not exercise so much that you hurt yourself, feel dizzy, or get very short of breath.  Consult your health care  provider before starting a new exercise program.  Wear comfortable clothes and shoes with good support.  Drink plenty of water while you exercise to prevent dehydration or heat stroke. Body water is lost during exercise and must be replaced.  Work out until you breathe faster and your heart beats faster. This information is not intended to replace advice given to you by your health  care provider. Make sure you discuss any questions you have with your health care provider. Document Released: 05/27/2010 Document Revised: 09/30/2015 Document Reviewed: 09/25/2013  2017 Elsevier   If

## 2016-06-22 LAB — HEPATITIS C ANTIBODY: HCV AB: NEGATIVE

## 2016-06-23 ENCOUNTER — Encounter: Payer: Self-pay | Admitting: Internal Medicine

## 2016-07-13 DIAGNOSIS — Z1231 Encounter for screening mammogram for malignant neoplasm of breast: Secondary | ICD-10-CM | POA: Diagnosis not present

## 2016-07-13 DIAGNOSIS — Z803 Family history of malignant neoplasm of breast: Secondary | ICD-10-CM | POA: Diagnosis not present

## 2016-07-13 LAB — HM MAMMOGRAPHY

## 2016-07-18 ENCOUNTER — Encounter: Payer: Self-pay | Admitting: Family Medicine

## 2016-09-03 ENCOUNTER — Other Ambulatory Visit: Payer: Self-pay | Admitting: Internal Medicine

## 2016-09-04 NOTE — Telephone Encounter (Signed)
Sent to the pharmacy by e-scribe for 90 days. 

## 2016-09-08 ENCOUNTER — Encounter: Payer: Self-pay | Admitting: Internal Medicine

## 2016-09-12 NOTE — Telephone Encounter (Signed)
OK to do this .   Jacqueline Myers  please send in   rx  voltaren gel. Generic   4 grams  As needed every 6-8 hours for joint pain  Disp   100 gram refill x 2

## 2016-09-13 MED ORDER — DICLOFENAC SODIUM 1 % TD GEL: 4.0000 g | Freq: Four times a day (QID) | TRANSDERMAL | 2 refills | Status: AC

## 2016-09-18 ENCOUNTER — Other Ambulatory Visit: Payer: Self-pay | Admitting: Internal Medicine

## 2016-09-25 DIAGNOSIS — H5310 Unspecified subjective visual disturbances: Secondary | ICD-10-CM | POA: Diagnosis not present

## 2016-09-25 DIAGNOSIS — Z961 Presence of intraocular lens: Secondary | ICD-10-CM | POA: Diagnosis not present

## 2016-10-10 ENCOUNTER — Ambulatory Visit (INDEPENDENT_AMBULATORY_CARE_PROVIDER_SITE_OTHER): Payer: Medicare Other | Admitting: Internal Medicine

## 2016-10-10 ENCOUNTER — Ambulatory Visit (INDEPENDENT_AMBULATORY_CARE_PROVIDER_SITE_OTHER)
Admission: RE | Admit: 2016-10-10 | Discharge: 2016-10-10 | Disposition: A | Discharge status: Home / Self Care | Payer: Medicare Other | Source: Ambulatory Visit | Attending: Internal Medicine | Admitting: Internal Medicine

## 2016-10-10 ENCOUNTER — Other Ambulatory Visit: Payer: Self-pay | Admitting: Internal Medicine

## 2016-10-10 ENCOUNTER — Encounter: Payer: Self-pay | Admitting: Internal Medicine

## 2016-10-10 VITALS — BP 140/64 | HR 66 | Temp 98.5°F | Ht 63.0 in | Wt 145.0 lb

## 2016-10-10 DIAGNOSIS — G8929 Other chronic pain: Secondary | ICD-10-CM

## 2016-10-10 DIAGNOSIS — R200 Anesthesia of skin: Secondary | ICD-10-CM

## 2016-10-10 DIAGNOSIS — R202 Paresthesia of skin: Secondary | ICD-10-CM

## 2016-10-10 DIAGNOSIS — M4310 Spondylolisthesis, site unspecified: Secondary | ICD-10-CM

## 2016-10-10 DIAGNOSIS — M545 Low back pain: Secondary | ICD-10-CM

## 2016-10-10 DIAGNOSIS — M9903 Segmental and somatic dysfunction of lumbar region: Secondary | ICD-10-CM | POA: Diagnosis not present

## 2016-10-10 DIAGNOSIS — M9904 Segmental and somatic dysfunction of sacral region: Secondary | ICD-10-CM | POA: Diagnosis not present

## 2016-10-10 DIAGNOSIS — S29012A Strain of muscle and tendon of back wall of thorax, initial encounter: Secondary | ICD-10-CM | POA: Diagnosis not present

## 2016-10-10 DIAGNOSIS — M9902 Segmental and somatic dysfunction of thoracic region: Secondary | ICD-10-CM | POA: Diagnosis not present

## 2016-10-10 DIAGNOSIS — M47816 Spondylosis without myelopathy or radiculopathy, lumbar region: Secondary | ICD-10-CM | POA: Diagnosis not present

## 2016-10-10 DIAGNOSIS — M5137 Other intervertebral disc degeneration, lumbosacral region: Secondary | ICD-10-CM | POA: Diagnosis not present

## 2016-10-10 DIAGNOSIS — Z96652 Presence of left artificial knee joint: Secondary | ICD-10-CM | POA: Diagnosis not present

## 2016-10-10 DIAGNOSIS — M4317 Spondylolisthesis, lumbosacral region: Secondary | ICD-10-CM | POA: Diagnosis not present

## 2016-10-10 NOTE — Patient Instructions (Signed)
Proceed with x-ray to check your lower spine stability and anterolisthesis. We'll get your results on my chart when available

## 2016-10-10 NOTE — Progress Notes (Signed)
Chief Complaint  Patient presents with  . Back Pain    started in jan/     HPI: Jacqueline Myers 66 y.o.  sda  Appt. 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 She chose to try going to a chiropractor as opposed to orthopedics at this time and the, chiropractor noted some anterolisthesis at L5 and wanted to try to get x-rays done to ensure stability of the area Her pain is waxing and waning not really radiating but she does have tingling in her left foot. It was mentioned that she may have had an old injury or an old fracture she does have a history of remote injury with severe pain when she was much younger. ROS: See pertinent positives and negatives per HPI.doing better with activity celexa heloing anxiety   Past Medical History:  Diagnosis Date  . Colon polyps    hyperplastic  . Diverticulosis   . Esophageal stricture   . GERD (gastroesophageal reflux disease)   . History of bronchitis   . Hyperlipidemia   . Hypertension   . Mitral valve prolapse   . Osteoarthritis   . Osteoporosis   . Spinal stenosis     Family History  Problem Relation Age of Onset  . Breast cancer Sister   . Alcohol abuse Mother   . Stroke Mother   . Hypertension Mother   . Diabetes Mother   . Hyperlipidemia Mother   . Hyperlipidemia Father   . Colon cancer Neg Hx     Social History   Social History  . Marital status: Married    Spouse name: N/A  . Number of children: 2  . Years of education: N/A   Occupational History  . Retired    Social History Main Topics  . Smoking status: Former Smoker    Packs/day: 1.00    Years: 29.00    Types: Cigarettes    Quit date: 05/09/1999  . Smokeless tobacco: Never Used  . Alcohol use 0.0 oz/week     Comment: OCC- GLASS OF WINE  . Drug use: No  . Sexual activity: Not Asked   Other Topics Concern  . None   Social History Narrative   Married   HH of 2 helps take care 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   Neg td neg ets    Outpatient Medications Prior to Visit  Medication Sig Dispense Refill  . acetaminophen (TYLENOL) 500 MG tablet Take 500-1,000 mg by mouth every 6 (six) hours as needed for moderate pain.    Marland Kitchen aspirin EC 81 MG tablet Take 81 mg by mouth daily.    . citalopram (CELEXA) 10 MG tablet TAKE ONE & ONE-HALF TABLETS BY MOUTH ONCE DAILY 135 tablet 0  . cyclobenzaprine (FLEXERIL) 10 MG tablet TAKE ONE-HALF TO ONE TABLET BY MOUTH THREE TIMES DAILY AS NEEDED FOR MUSCLE SPASM. (Patient taking differently: Take 10 mg by mouth daily. TAKE ONE-HALF TO ONE TABLET BY MOUTH THREE TIMES DAILY AS NEEDED FOR MUSCLE SPASM.) 30 tablet 3  . diclofenac sodium (VOLTAREN) 1 % GEL Apply 4 g topically 4 (four) times daily. 100 g 2  . LORazepam (ATIVAN) 1 MG tablet Take 0.5-1 tablets (0.5-1 mg total) by mouth every 8 (  eight) hours as needed for anxiety. 15 tablet 0  . valsartan-hydrochlorothiazide (DIOVAN-HCT) 320-25 MG tablet Take 1 tablet by mouth daily. 90 tablet 3  . VOLTAREN 1 % GEL APPLY 4 GRAMS TOPICALLY 4 TIMES DAILY AS NEEDED FOR KNEE ARTHRITIS 300 g 0  . rosuvastatin (CRESTOR) 5 MG tablet Take 1 tablet (5 mg total) by mouth daily. PLEASE GIVE 30 DAY SUPPLY UPON PATIENT REQUEST 90 tablet 3   No facility-administered medications prior to visit.      EXAM:  BP 140/64 (BP Location: Right Arm, Patient Position: Sitting, Cuff Size: Normal)   Pulse 66   Temp 98.5 F (36.9 C) (Oral)   Ht 5\' 3"  (1.6 m)   Wt 145 lb (65.8 kg)   BMI 25.69 kg/m   Body mass index is 25.69 kg/m.  GENERAL: vitals reviewed and listed above, alert, oriented, appears well hydrated and in no acute distress HEENT: atraumatic, conjunctiva  clear, no obvious abnormalities on inspection of external nose and ears  CV: HRRR, no clubbing  cyanosis or  peripheral edema nl cap refill  MS: moves all extremities without noticeable focal  Abnormality Back flexible nl gait toe heel walk  Ok no obv weakness  PSYCH: pleasant and cooperative, no obvious depression or anxiety  ASSESSMENT AND PLAN:  Discussed the following assessment and plan:  Anterolisthesis - Plan: DG Lumb Spine Flex&Ext Only, CANCELED: DG Lumbar Spine Complete  Chronic low back pain, unspecified back pain laterality, with sciatica presence unspecified - Plan: DG Lumb Spine Flex&Ext Only, CANCELED: DG Lumbar Spine Complete  Numbness and tingling of foot - Plan: DG Lumb Spine Flex&Ext Only, CANCELED: DG Lumbar Spine Complete  Status post total left knee replacement Is reasonable to do the x-rays as requested to look for stability consideration of chiropractic intervention. If there is abnormality with consider her seeing a spine specialist. Ordered flexion-extension lumbar for stability finding.   -Patient advised to return or notify health care team  if symptoms worsen ,persist or new concerns arise.  Patient Instructions  Proceed with x-ray to check your lower spine stability and anterolisthesis. We'll get your results on my chart when available     Standley Brooking. Panosh M.D.

## 2016-10-11 ENCOUNTER — Telehealth: Payer: Self-pay | Admitting: Emergency Medicine

## 2016-10-11 NOTE — Telephone Encounter (Signed)
Pt states that she seen [facility].

## 2016-10-13 DIAGNOSIS — M5137 Other intervertebral disc degeneration, lumbosacral region: Secondary | ICD-10-CM | POA: Diagnosis not present

## 2016-10-13 DIAGNOSIS — M9904 Segmental and somatic dysfunction of sacral region: Secondary | ICD-10-CM | POA: Diagnosis not present

## 2016-10-13 DIAGNOSIS — M4317 Spondylolisthesis, lumbosacral region: Secondary | ICD-10-CM | POA: Diagnosis not present

## 2016-10-13 DIAGNOSIS — S29012A Strain of muscle and tendon of back wall of thorax, initial encounter: Secondary | ICD-10-CM | POA: Diagnosis not present

## 2016-10-13 DIAGNOSIS — M9902 Segmental and somatic dysfunction of thoracic region: Secondary | ICD-10-CM | POA: Diagnosis not present

## 2016-10-13 DIAGNOSIS — M9903 Segmental and somatic dysfunction of lumbar region: Secondary | ICD-10-CM | POA: Diagnosis not present

## 2016-10-16 DIAGNOSIS — M4317 Spondylolisthesis, lumbosacral region: Secondary | ICD-10-CM | POA: Diagnosis not present

## 2016-10-16 DIAGNOSIS — S29012A Strain of muscle and tendon of back wall of thorax, initial encounter: Secondary | ICD-10-CM | POA: Diagnosis not present

## 2016-10-16 DIAGNOSIS — M9904 Segmental and somatic dysfunction of sacral region: Secondary | ICD-10-CM | POA: Diagnosis not present

## 2016-10-16 DIAGNOSIS — M5137 Other intervertebral disc degeneration, lumbosacral region: Secondary | ICD-10-CM | POA: Diagnosis not present

## 2016-10-16 DIAGNOSIS — M9902 Segmental and somatic dysfunction of thoracic region: Secondary | ICD-10-CM | POA: Diagnosis not present

## 2016-10-16 DIAGNOSIS — M9903 Segmental and somatic dysfunction of lumbar region: Secondary | ICD-10-CM | POA: Diagnosis not present

## 2016-10-19 DIAGNOSIS — S29012A Strain of muscle and tendon of back wall of thorax, initial encounter: Secondary | ICD-10-CM | POA: Diagnosis not present

## 2016-10-19 DIAGNOSIS — M9904 Segmental and somatic dysfunction of sacral region: Secondary | ICD-10-CM | POA: Diagnosis not present

## 2016-10-19 DIAGNOSIS — M4317 Spondylolisthesis, lumbosacral region: Secondary | ICD-10-CM | POA: Diagnosis not present

## 2016-10-19 DIAGNOSIS — M9903 Segmental and somatic dysfunction of lumbar region: Secondary | ICD-10-CM | POA: Diagnosis not present

## 2016-10-19 DIAGNOSIS — M9902 Segmental and somatic dysfunction of thoracic region: Secondary | ICD-10-CM | POA: Diagnosis not present

## 2016-10-19 DIAGNOSIS — M5137 Other intervertebral disc degeneration, lumbosacral region: Secondary | ICD-10-CM | POA: Diagnosis not present

## 2016-10-23 DIAGNOSIS — S29012A Strain of muscle and tendon of back wall of thorax, initial encounter: Secondary | ICD-10-CM | POA: Diagnosis not present

## 2016-10-23 DIAGNOSIS — M5137 Other intervertebral disc degeneration, lumbosacral region: Secondary | ICD-10-CM | POA: Diagnosis not present

## 2016-10-23 DIAGNOSIS — M9903 Segmental and somatic dysfunction of lumbar region: Secondary | ICD-10-CM | POA: Diagnosis not present

## 2016-10-23 DIAGNOSIS — M4317 Spondylolisthesis, lumbosacral region: Secondary | ICD-10-CM | POA: Diagnosis not present

## 2016-10-23 DIAGNOSIS — M9902 Segmental and somatic dysfunction of thoracic region: Secondary | ICD-10-CM | POA: Diagnosis not present

## 2016-10-23 DIAGNOSIS — M9904 Segmental and somatic dysfunction of sacral region: Secondary | ICD-10-CM | POA: Diagnosis not present

## 2016-10-26 DIAGNOSIS — M9902 Segmental and somatic dysfunction of thoracic region: Secondary | ICD-10-CM | POA: Diagnosis not present

## 2016-10-26 DIAGNOSIS — S29012A Strain of muscle and tendon of back wall of thorax, initial encounter: Secondary | ICD-10-CM | POA: Diagnosis not present

## 2016-10-26 DIAGNOSIS — M5137 Other intervertebral disc degeneration, lumbosacral region: Secondary | ICD-10-CM | POA: Diagnosis not present

## 2016-10-26 DIAGNOSIS — M9903 Segmental and somatic dysfunction of lumbar region: Secondary | ICD-10-CM | POA: Diagnosis not present

## 2016-10-26 DIAGNOSIS — M9904 Segmental and somatic dysfunction of sacral region: Secondary | ICD-10-CM | POA: Diagnosis not present

## 2016-10-26 DIAGNOSIS — M4317 Spondylolisthesis, lumbosacral region: Secondary | ICD-10-CM | POA: Diagnosis not present

## 2016-10-30 DIAGNOSIS — S29012A Strain of muscle and tendon of back wall of thorax, initial encounter: Secondary | ICD-10-CM | POA: Diagnosis not present

## 2016-10-30 DIAGNOSIS — M9902 Segmental and somatic dysfunction of thoracic region: Secondary | ICD-10-CM | POA: Diagnosis not present

## 2016-10-30 DIAGNOSIS — M9903 Segmental and somatic dysfunction of lumbar region: Secondary | ICD-10-CM | POA: Diagnosis not present

## 2016-10-30 DIAGNOSIS — M9904 Segmental and somatic dysfunction of sacral region: Secondary | ICD-10-CM | POA: Diagnosis not present

## 2016-10-30 DIAGNOSIS — M4317 Spondylolisthesis, lumbosacral region: Secondary | ICD-10-CM | POA: Diagnosis not present

## 2016-10-30 DIAGNOSIS — M5137 Other intervertebral disc degeneration, lumbosacral region: Secondary | ICD-10-CM | POA: Diagnosis not present

## 2016-11-02 DIAGNOSIS — M9904 Segmental and somatic dysfunction of sacral region: Secondary | ICD-10-CM | POA: Diagnosis not present

## 2016-11-02 DIAGNOSIS — S29012A Strain of muscle and tendon of back wall of thorax, initial encounter: Secondary | ICD-10-CM | POA: Diagnosis not present

## 2016-11-02 DIAGNOSIS — M9903 Segmental and somatic dysfunction of lumbar region: Secondary | ICD-10-CM | POA: Diagnosis not present

## 2016-11-02 DIAGNOSIS — M4317 Spondylolisthesis, lumbosacral region: Secondary | ICD-10-CM | POA: Diagnosis not present

## 2016-11-02 DIAGNOSIS — M5137 Other intervertebral disc degeneration, lumbosacral region: Secondary | ICD-10-CM | POA: Diagnosis not present

## 2016-11-02 DIAGNOSIS — M9902 Segmental and somatic dysfunction of thoracic region: Secondary | ICD-10-CM | POA: Diagnosis not present

## 2016-11-16 DIAGNOSIS — M5137 Other intervertebral disc degeneration, lumbosacral region: Secondary | ICD-10-CM | POA: Diagnosis not present

## 2016-11-16 DIAGNOSIS — M9904 Segmental and somatic dysfunction of sacral region: Secondary | ICD-10-CM | POA: Diagnosis not present

## 2016-11-16 DIAGNOSIS — M9902 Segmental and somatic dysfunction of thoracic region: Secondary | ICD-10-CM | POA: Diagnosis not present

## 2016-11-16 DIAGNOSIS — M9903 Segmental and somatic dysfunction of lumbar region: Secondary | ICD-10-CM | POA: Diagnosis not present

## 2016-11-16 DIAGNOSIS — M4317 Spondylolisthesis, lumbosacral region: Secondary | ICD-10-CM | POA: Diagnosis not present

## 2016-11-16 DIAGNOSIS — S29012A Strain of muscle and tendon of back wall of thorax, initial encounter: Secondary | ICD-10-CM | POA: Diagnosis not present

## 2016-11-23 DIAGNOSIS — M4317 Spondylolisthesis, lumbosacral region: Secondary | ICD-10-CM | POA: Diagnosis not present

## 2016-11-23 DIAGNOSIS — M9903 Segmental and somatic dysfunction of lumbar region: Secondary | ICD-10-CM | POA: Diagnosis not present

## 2016-11-23 DIAGNOSIS — M9902 Segmental and somatic dysfunction of thoracic region: Secondary | ICD-10-CM | POA: Diagnosis not present

## 2016-11-23 DIAGNOSIS — M5137 Other intervertebral disc degeneration, lumbosacral region: Secondary | ICD-10-CM | POA: Diagnosis not present

## 2016-11-23 DIAGNOSIS — M9904 Segmental and somatic dysfunction of sacral region: Secondary | ICD-10-CM | POA: Diagnosis not present

## 2016-11-23 DIAGNOSIS — S29012A Strain of muscle and tendon of back wall of thorax, initial encounter: Secondary | ICD-10-CM | POA: Diagnosis not present

## 2016-11-24 DIAGNOSIS — L820 Inflamed seborrheic keratosis: Secondary | ICD-10-CM | POA: Diagnosis not present

## 2016-11-28 ENCOUNTER — Other Ambulatory Visit: Payer: Self-pay | Admitting: Internal Medicine

## 2016-11-30 DIAGNOSIS — M5137 Other intervertebral disc degeneration, lumbosacral region: Secondary | ICD-10-CM | POA: Diagnosis not present

## 2016-11-30 DIAGNOSIS — M9902 Segmental and somatic dysfunction of thoracic region: Secondary | ICD-10-CM | POA: Diagnosis not present

## 2016-11-30 DIAGNOSIS — M9904 Segmental and somatic dysfunction of sacral region: Secondary | ICD-10-CM | POA: Diagnosis not present

## 2016-11-30 DIAGNOSIS — M4317 Spondylolisthesis, lumbosacral region: Secondary | ICD-10-CM | POA: Diagnosis not present

## 2016-11-30 DIAGNOSIS — M9903 Segmental and somatic dysfunction of lumbar region: Secondary | ICD-10-CM | POA: Diagnosis not present

## 2016-11-30 DIAGNOSIS — S29012A Strain of muscle and tendon of back wall of thorax, initial encounter: Secondary | ICD-10-CM | POA: Diagnosis not present

## 2016-12-01 ENCOUNTER — Encounter: Payer: Self-pay | Admitting: Internal Medicine

## 2016-12-04 MED ORDER — CANDESARTAN CILEXETIL-HCTZ 32-25 MG PO TABS: 1.0000 | ORAL_TABLET | Freq: Every day | ORAL | 3 refills | Status: DC

## 2016-12-04 NOTE — Telephone Encounter (Signed)
We have had many patients had to switch their medicines. I would not no weight alternatives in your formulary are covered but We'll send  candesartan hctz   30 days  w refills  Plan rov in 1-3  mos or as planned   After switch

## 2016-12-07 DIAGNOSIS — S29012A Strain of muscle and tendon of back wall of thorax, initial encounter: Secondary | ICD-10-CM | POA: Diagnosis not present

## 2016-12-07 DIAGNOSIS — M9902 Segmental and somatic dysfunction of thoracic region: Secondary | ICD-10-CM | POA: Diagnosis not present

## 2016-12-07 DIAGNOSIS — M5137 Other intervertebral disc degeneration, lumbosacral region: Secondary | ICD-10-CM | POA: Diagnosis not present

## 2016-12-07 DIAGNOSIS — M4317 Spondylolisthesis, lumbosacral region: Secondary | ICD-10-CM | POA: Diagnosis not present

## 2016-12-07 DIAGNOSIS — M9903 Segmental and somatic dysfunction of lumbar region: Secondary | ICD-10-CM | POA: Diagnosis not present

## 2016-12-07 DIAGNOSIS — M9904 Segmental and somatic dysfunction of sacral region: Secondary | ICD-10-CM | POA: Diagnosis not present

## 2016-12-15 NOTE — Progress Notes (Signed)
Chief Complaint  Patient presents with  . Follow-up    HPI: Jacqueline Myers 66 y.o. come in for Chronic disease management   voltaren pills for acute rib fx .  ,most day ok .  Slipped in tub   Getting out and arm slipped and hit chest wall  Getting back relief with chiro  .  Doing better   Only ocass voltaren and doing better .  Bp  Change med cause of valsartan recall.     No sig se.  And doing well bp readings  in teens.  Feels great from that .  Would like to recheck lipids   Off FO    ROS: See pertinent positives and negatives per HPI.  Past Medical History:  Diagnosis Date  . Colon polyps    hyperplastic  . Diverticulosis   . Esophageal stricture   . GERD (gastroesophageal reflux disease)   . History of bronchitis   . Hyperlipidemia   . Hypertension   . Mitral valve prolapse   . Osteoarthritis   . Osteoporosis   . Spinal stenosis     Family History  Problem Relation Age of Onset  . Breast cancer Sister   . Alcohol abuse Mother   . Stroke Mother   . Hypertension Mother   . Diabetes Mother   . Hyperlipidemia Mother   . Hyperlipidemia Father   . Colon cancer Neg Hx     Social History   Social History  . Marital status: Married    Spouse name: N/A  . Number of children: 2  . Years of education: N/A   Occupational History  . Retired    Social History Main Topics  . Smoking status: Former Smoker    Packs/day: 1.00    Years: 29.00    Types: Cigarettes    Quit date: 05/09/1999  . Smokeless tobacco: Never Used  . Alcohol use 0.0 oz/week     Comment: OCC- GLASS OF WINE  . Drug use: No  . Sexual activity: Not Asked   Other Topics Concern  . None   Social History Narrative   Married   HH of 2 helps take care 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   Neg td neg ets    Outpatient Medications Prior to Visit  Medication  Sig Dispense Refill  . acetaminophen (TYLENOL) 500 MG tablet Take 500-1,000 mg by mouth every 6 (six) hours as needed for moderate pain.    Marland Kitchen aspirin EC 81 MG tablet Take 81 mg by mouth daily.    . Candesartan Cilexetil-HCTZ 32-25 MG TABS Take 1 tablet by mouth daily. 30 each 3  . citalopram (CELEXA) 10 MG tablet TAKE 1 & 1/2 (ONE & ONE-HALF) TABLETS BY MOUTH ONCE DAILY 135 tablet 0  . cyclobenzaprine (FLEXERIL) 10 MG tablet TAKE ONE-HALF TO ONE TABLET BY MOUTH THREE TIMES DAILY AS NEEDED FOR MUSCLE SPASM. (Patient taking differently: Take 10 mg by mouth daily. TAKE ONE-HALF TO ONE TABLET BY MOUTH THREE TIMES DAILY AS NEEDED FOR MUSCLE SPASM.) 30 tablet 3  . diclofenac (VOLTAREN) 75 MG EC tablet Take 1 tablet (75 mg total) by mouth 2 (two) times daily. 30 tablet 0  . diclofenac sodium (VOLTAREN) 1 % GEL Apply 4 g topically 4 (four) times daily. 100 g 2  . DOCOSAHEXAENOIC ACID PO Take 1 g by  mouth.    Marland Kitchen LORazepam (ATIVAN) 1 MG tablet Take 0.5-1 tablets (0.5-1 mg total) by mouth every 8 (eight) hours as needed for anxiety. 15 tablet 0  . VOLTAREN 1 % GEL APPLY 4 GRAMS TOPICALLY 4 TIMES DAILY AS NEEDED FOR KNEE ARTHRITIS 300 g 0  . rosuvastatin (CRESTOR) 5 MG tablet Take 1 tablet (5 mg total) by mouth daily. PLEASE GIVE 30 DAY SUPPLY UPON PATIENT REQUEST 90 tablet 3   No facility-administered medications prior to visit.      EXAM:  BP 110/60 (BP Location: Right Arm, Patient Position: Sitting, Cuff Size: Normal)   Pulse 69   Temp 98 F (36.7 C) (Oral)   Wt 143 lb (64.9 kg)   BMI 25.33 kg/m   Body mass index is 25.33 kg/m.  GENERAL: vitals reviewed and listed above, alert, oriented, appears well hydrated and in no acute distress HEENT: atraumatic, conjunctiva  clear, no obvious abnormalities on inspection of external nose and ears NECK: no obvious masses on inspection palpation  LUNGS: clear to auscultation bilaterally, no wheezes, rales or rhonchi, good air movement CV: HRRR, no clubbing  cyanosis or  peripheral edema nl cap refill  Abdomen:  Sof,t normal bowel sounds without hepatosplenomegaly, no guarding rebound or masses no CVA tenderness MS: moves all extremities without noticeable focal  abnormality PSYCH: pleasant and cooperative, no obvious depression or anxiety Lab Results  Component Value Date   WBC 8.4 06/21/2015   HGB 13.9 06/21/2015   HCT 42.7 06/21/2015   PLT 291.0 06/21/2015   GLUCOSE 92 06/20/2016   CHOL 167 06/20/2016   TRIG 165.0 (H) 06/20/2016   HDL 60.60 06/20/2016   LDLDIRECT 127.6 06/23/2013   LDLCALC 74 06/20/2016   ALT 18 03/27/2016   AST 20 03/27/2016   NA 139 06/20/2016   K 4.1 06/20/2016   CL 101 06/20/2016   CREATININE 0.76 06/20/2016   BUN 19 06/20/2016   CO2 31 06/20/2016   TSH 2.14 06/20/2016   INR 0.93 04/08/2015   HGBA1C 6.1 06/20/2016   BP Readings from Last 3 Encounters:  12/25/16 110/60  12/16/16 128/66  10/10/16 140/64   Wt Readings from Last 3 Encounters:  12/25/16 143 lb (64.9 kg)  12/16/16 146 lb (66.2 kg)  10/10/16 145 lb (65.8 kg)     ASSESSMENT AND PLAN:  Discussed the following assessment and plan:  Essential hypertension - good ocntrol on med switch - Plan: Basic metabolic panel, Lipid panel  Medication management - Plan: Basic metabolic panel, Lipid panel  Hyperlipidemia, unspecified hyperlipidemia type - recheck tg off  FO - Plan: Basic metabolic panel, Lipid panel  Anxiety state - doing great on meds  -Patient advised to return or notify health care team  if  new concerns arise.  Patient Instructions  continue If labs ok .  6 months  Yearly visit .    Standley Brooking. Shaneeka Scarboro M.D.

## 2016-12-16 ENCOUNTER — Encounter: Payer: Self-pay | Admitting: Family Medicine

## 2016-12-16 ENCOUNTER — Ambulatory Visit (INDEPENDENT_AMBULATORY_CARE_PROVIDER_SITE_OTHER): Payer: Medicare Other | Admitting: Family Medicine

## 2016-12-16 DIAGNOSIS — R0789 Other chest pain: Secondary | ICD-10-CM | POA: Insufficient documentation

## 2016-12-16 DIAGNOSIS — W19XXXA Unspecified fall, initial encounter: Secondary | ICD-10-CM | POA: Diagnosis not present

## 2016-12-16 MED ORDER — DICLOFENAC SODIUM 75 MG PO TBEC: 75.0000 mg | DELAYED_RELEASE_TABLET | Freq: Two times a day (BID) | ORAL | 0 refills | Status: DC

## 2016-12-16 NOTE — Patient Instructions (Addendum)
Make sure to take deep breaths. Start diclofenac twice daily for pain and inflammation. For milder pain use tylenol OTC.  No heavy lifting.  If fever or shortness of breath.. Go to ER or call.

## 2016-12-16 NOTE — Assessment & Plan Note (Signed)
Likely rib contusion or fracture.  Treat with NSAIDs for pain control. Pt intolerant of opioids and tramadol.  Encouraged to take deep breaths.

## 2016-12-16 NOTE — Progress Notes (Signed)
   Subjective:    Patient ID: Jacqueline Myers, female    DOB: May 30, 1950, 66 y.o.   MRN: [id number]  HPI   66 year old female pt of Dr. Regis Bill with history of osteopenia presents following fall in bathtub  2 days ago. She slipped accidentally. As she was trying to stand up, slipped and hit rib cage on right. Since then pain over site of impact.  Pain with deep breaths. She was doing okay until this morning when she pressed on chest wall.Marland Kitchen Heard pop.. Pain worse since. No contusion.  No fever.  No SOB, no wheeze, no hemoptysis.  Has not taken any pain med OTC. Does not tolerated opioids.  Social History /Family History/Past Medical History reviewed in detail and updated in EMR if needed. Blood pressure 128/66, pulse 74, temperature 98.5 F (36.9 C), temperature source Oral, weight 146 lb (66.2 kg), SpO2 97 %.   Review of Systems  Constitutional: Negative for fatigue and fever.  HENT: Negative for ear pain.   Eyes: Negative for pain.  Respiratory: Negative for chest tightness and shortness of breath.   Cardiovascular: Negative for chest pain, palpitations and leg swelling.  Gastrointestinal: Negative for abdominal pain.  Genitourinary: Negative for dysuria.       Objective:   Physical Exam  Constitutional: Vital signs are normal. She appears well-developed and well-nourished. She is cooperative.  Non-toxic appearance. She does not appear ill. No distress.  HENT:  Head: Normocephalic.  Right Ear: Hearing, tympanic membrane, external ear and ear canal normal. Tympanic membrane is not erythematous, not retracted and not bulging.  Left Ear: Hearing, tympanic membrane, external ear and ear canal normal. Tympanic membrane is not erythematous, not retracted and not bulging.  Nose: No mucosal edema or rhinorrhea. Right sinus exhibits no maxillary sinus tenderness and no frontal sinus tenderness. Left sinus exhibits no maxillary sinus tenderness and no frontal sinus tenderness.    Mouth/Throat: Uvula is midline, oropharynx is clear and moist and mucous membranes are normal.  Eyes: Pupils are equal, round, and reactive to light. Conjunctivae, EOM and lids are normal. Lids are everted and swept, no foreign bodies found.  Neck: Trachea normal and normal range of motion. Neck supple. Carotid bruit is not present. No thyroid mass and no thyromegaly present.  Cardiovascular: Normal rate, regular rhythm, S1 normal, S2 normal, normal heart sounds, intact distal pulses and normal pulses.  Exam reveals no gallop and no friction rub.   No murmur heard. Pulmonary/Chest: Effort normal and breath sounds normal. No tachypnea. No respiratory distress. She has no decreased breath sounds. She has no wheezes. She has no rhonchi. She has no rales. Chest wall is not dull to percussion. She exhibits tenderness and bony tenderness. She exhibits no mass.    No contusion of skin.. circleld area of ttp.  Abdominal: Soft. Normal appearance and bowel sounds are normal. There is no tenderness.  Neurological: She is alert.  Skin: Skin is warm, dry and intact. No rash noted.  Psychiatric: Her speech is normal and behavior is normal. Judgment and thought content normal. Her mood appears not anxious. Cognition and memory are normal. She does not exhibit a depressed mood.          Assessment & Plan:

## 2016-12-21 DIAGNOSIS — S29012A Strain of muscle and tendon of back wall of thorax, initial encounter: Secondary | ICD-10-CM | POA: Diagnosis not present

## 2016-12-21 DIAGNOSIS — M5137 Other intervertebral disc degeneration, lumbosacral region: Secondary | ICD-10-CM | POA: Diagnosis not present

## 2016-12-21 DIAGNOSIS — M9902 Segmental and somatic dysfunction of thoracic region: Secondary | ICD-10-CM | POA: Diagnosis not present

## 2016-12-21 DIAGNOSIS — M4317 Spondylolisthesis, lumbosacral region: Secondary | ICD-10-CM | POA: Diagnosis not present

## 2016-12-21 DIAGNOSIS — M9904 Segmental and somatic dysfunction of sacral region: Secondary | ICD-10-CM | POA: Diagnosis not present

## 2016-12-21 DIAGNOSIS — M9903 Segmental and somatic dysfunction of lumbar region: Secondary | ICD-10-CM | POA: Diagnosis not present

## 2016-12-25 ENCOUNTER — Encounter: Payer: Self-pay | Admitting: Internal Medicine

## 2016-12-25 ENCOUNTER — Ambulatory Visit (INDEPENDENT_AMBULATORY_CARE_PROVIDER_SITE_OTHER): Payer: Medicare Other | Admitting: Internal Medicine

## 2016-12-25 VITALS — BP 110/60 | HR 69 | Temp 98.0°F | Wt 143.0 lb

## 2016-12-25 DIAGNOSIS — F411 Generalized anxiety disorder: Secondary | ICD-10-CM | POA: Diagnosis not present

## 2016-12-25 DIAGNOSIS — Z79899 Other long term (current) drug therapy: Secondary | ICD-10-CM | POA: Diagnosis not present

## 2016-12-25 DIAGNOSIS — I10 Essential (primary) hypertension: Secondary | ICD-10-CM

## 2016-12-25 DIAGNOSIS — E785 Hyperlipidemia, unspecified: Secondary | ICD-10-CM

## 2016-12-25 LAB — LIPID PANEL
CHOL/HDL RATIO: 3
Cholesterol: 143 mg/dL (ref 0–200)
HDL: 51 mg/dL (ref >39.00)
LDL CALC: 65 mg/dL (ref 0–99)
NONHDL: 91.6
Triglycerides: 131 mg/dL (ref 0.0–149.0)
VLDL: 26.2 mg/dL (ref 0.0–40.0)

## 2016-12-25 LAB — BASIC METABOLIC PANEL
BUN: 23 mg/dL (ref 6–23)
CHLORIDE: 104 meq/L (ref 96–112)
CO2: 27 meq/L (ref 19–32)
Calcium: 9.3 mg/dL (ref 8.4–10.5)
Creatinine, Ser: 0.86 mg/dL (ref 0.40–1.20)
GFR: 69.94 mL/min (ref >60.00)
GLUCOSE: 102 mg/dL — AB (ref 70–99)
POTASSIUM: 3.8 meq/L (ref 3.5–5.1)
SODIUM: 141 meq/L (ref 135–145)

## 2016-12-25 NOTE — Patient Instructions (Addendum)
continue If labs ok .  6 months  Yearly visit .

## 2017-01-18 DIAGNOSIS — M5137 Other intervertebral disc degeneration, lumbosacral region: Secondary | ICD-10-CM | POA: Diagnosis not present

## 2017-01-18 DIAGNOSIS — M9904 Segmental and somatic dysfunction of sacral region: Secondary | ICD-10-CM | POA: Diagnosis not present

## 2017-01-18 DIAGNOSIS — M9903 Segmental and somatic dysfunction of lumbar region: Secondary | ICD-10-CM | POA: Diagnosis not present

## 2017-01-18 DIAGNOSIS — M4317 Spondylolisthesis, lumbosacral region: Secondary | ICD-10-CM | POA: Diagnosis not present

## 2017-01-18 DIAGNOSIS — S29012A Strain of muscle and tendon of back wall of thorax, initial encounter: Secondary | ICD-10-CM | POA: Diagnosis not present

## 2017-01-18 DIAGNOSIS — M9902 Segmental and somatic dysfunction of thoracic region: Secondary | ICD-10-CM | POA: Diagnosis not present

## 2017-01-26 ENCOUNTER — Encounter: Payer: Self-pay | Admitting: Internal Medicine

## 2017-01-30 ENCOUNTER — Ambulatory Visit (INDEPENDENT_AMBULATORY_CARE_PROVIDER_SITE_OTHER): Payer: Medicare Other | Admitting: *Deleted

## 2017-01-30 DIAGNOSIS — Z23 Encounter for immunization: Secondary | ICD-10-CM | POA: Diagnosis not present

## 2017-02-01 DIAGNOSIS — M5137 Other intervertebral disc degeneration, lumbosacral region: Secondary | ICD-10-CM | POA: Diagnosis not present

## 2017-02-01 DIAGNOSIS — M9902 Segmental and somatic dysfunction of thoracic region: Secondary | ICD-10-CM | POA: Diagnosis not present

## 2017-02-01 DIAGNOSIS — M4317 Spondylolisthesis, lumbosacral region: Secondary | ICD-10-CM | POA: Diagnosis not present

## 2017-02-01 DIAGNOSIS — M9904 Segmental and somatic dysfunction of sacral region: Secondary | ICD-10-CM | POA: Diagnosis not present

## 2017-02-01 DIAGNOSIS — S29012A Strain of muscle and tendon of back wall of thorax, initial encounter: Secondary | ICD-10-CM | POA: Diagnosis not present

## 2017-02-01 DIAGNOSIS — M9903 Segmental and somatic dysfunction of lumbar region: Secondary | ICD-10-CM | POA: Diagnosis not present

## 2017-02-06 ENCOUNTER — Encounter: Payer: Self-pay | Admitting: Internal Medicine

## 2017-02-07 NOTE — Telephone Encounter (Signed)
Need more information how long it's been going on initially short of breath. If it's been going on more than 5-7 days and she's getting worse if she has a fever or increasing shortness of breath she should be seen in the office.

## 2017-02-22 ENCOUNTER — Other Ambulatory Visit: Payer: Self-pay | Admitting: Internal Medicine

## 2017-02-22 DIAGNOSIS — M9903 Segmental and somatic dysfunction of lumbar region: Secondary | ICD-10-CM | POA: Diagnosis not present

## 2017-02-22 DIAGNOSIS — S29012A Strain of muscle and tendon of back wall of thorax, initial encounter: Secondary | ICD-10-CM | POA: Diagnosis not present

## 2017-02-22 DIAGNOSIS — M4317 Spondylolisthesis, lumbosacral region: Secondary | ICD-10-CM | POA: Diagnosis not present

## 2017-02-22 DIAGNOSIS — M5137 Other intervertebral disc degeneration, lumbosacral region: Secondary | ICD-10-CM | POA: Diagnosis not present

## 2017-02-22 DIAGNOSIS — M9904 Segmental and somatic dysfunction of sacral region: Secondary | ICD-10-CM | POA: Diagnosis not present

## 2017-02-22 DIAGNOSIS — M9902 Segmental and somatic dysfunction of thoracic region: Secondary | ICD-10-CM | POA: Diagnosis not present

## 2017-02-23 DIAGNOSIS — K625 Hemorrhage of anus and rectum: Secondary | ICD-10-CM | POA: Insufficient documentation

## 2017-02-23 NOTE — Telephone Encounter (Signed)
Ok to refill x 1    Tell patient  Avoid reg use  This is a potentially a  high risk medication

## 2017-02-23 NOTE — Progress Notes (Signed)
Chief Complaint  Patient presents with  . Rectal Bleeding    x1 week. Pt states that she notices blood clots in the water and on toilet paper. Water will be bright red with bowel movements. Stools are grey in color. No abdomina pain but she does c/o low back pain. No pain in rectum. Random diarrhea and stool consistency varies.     HPI: Jacqueline Myers 66 y.o.  sda  Co "rectal bleeding" for 1 week with each BM  No hx of  Same .  Diarrhea day of  A big wedding  Felt from  ? Nerves and then Monday 1-2 days later  had noted BRRB  w BM  dripping blood   In toilet  Did sitz baths and no help.   Continues swith each Bm some clots    And bleed  No pain   No constipation or painful bm  Last colon   chiro practor for pyriformis syndrome.  Happens every time since then .   Diarrhea is better  .  Last colon 2014   ROS: See pertinent positives and negatives per HPI. No fever weight loss abd pain of import   Past Medical History:  Diagnosis Date  . Colon polyps    hyperplastic  . Diverticulosis   . Esophageal stricture   . GERD (gastroesophageal reflux disease)   . History of bronchitis   . Hyperlipidemia   . Hypertension   . Mitral valve prolapse   . Osteoarthritis   . Osteoporosis   . Spinal stenosis     Family History  Problem Relation Age of Onset  . Breast cancer Sister   . Alcohol abuse Mother   . Stroke Mother   . Hypertension Mother   . Diabetes Mother   . Hyperlipidemia Mother   . Hyperlipidemia Father   . Colon cancer Neg Hx     Social History   Social History  . Marital status: Married    Spouse name: N/A  . Number of children: 2  . Years of education: N/A   Occupational History  . Retired    Social History Main Topics  . Smoking status: Former Smoker    Packs/day: 1.00    Years: 29.00    Types: Cigarettes    Quit date: 05/09/1999  . Smokeless tobacco: Never Used  . Alcohol use 0.0 oz/week     Comment: OCC- GLASS OF WINE  . Drug use: No  . Sexual  activity: Not Asked   Other Topics Concern  . None   Social History Narrative   Married   HH of 2 helps take care 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   Neg td neg ets    Outpatient Medications Prior to Visit  Medication Sig Dispense Refill  . acetaminophen (TYLENOL) 500 MG tablet Take 500-1,000 mg by mouth every 6 (six) hours as needed for moderate pain.    Marland Kitchen aspirin EC 81 MG tablet Take 81 mg by mouth daily.    . Candesartan Cilexetil-HCTZ 32-25 MG TABS Take 1 tablet by mouth daily. 30 each 3  . citalopram (CELEXA) 10 MG tablet TAKE 1 & 1/2 (ONE & ONE-HALF) TABLETS BY MOUTH ONCE DAILY 135 tablet 0  . cyclobenzaprine (FLEXERIL) 10 MG tablet TAKE ONE-HALF TO ONE TABLET BY MOUTH THREE TIMES DAILY AS NEEDED FOR MUSCLE SPASM 30  tablet 0  . diclofenac (VOLTAREN) 75 MG EC tablet Take 1 tablet (75 mg total) by mouth 2 (two) times daily. 30 tablet 0  . diclofenac sodium (VOLTAREN) 1 % GEL Apply 4 g topically 4 (four) times daily. 100 g 2  . DOCOSAHEXAENOIC ACID PO Take 1 g by mouth.    Marland Kitchen LORazepam (ATIVAN) 1 MG tablet Take 0.5-1 tablets (0.5-1 mg total) by mouth every 8 (eight) hours as needed for anxiety. 15 tablet 0  . rosuvastatin (CRESTOR) 5 MG tablet Take 1 tablet (5 mg total) by mouth daily. PLEASE GIVE 30 DAY SUPPLY UPON PATIENT REQUEST 90 tablet 3  . VOLTAREN 1 % GEL APPLY 4 GRAMS TOPICALLY 4 TIMES DAILY AS NEEDED FOR KNEE ARTHRITIS 300 g 0   No facility-administered medications prior to visit.      EXAM:  BP 128/68 (BP Location: Right Arm, Patient Position: Sitting, Cuff Size: Normal)   Pulse 85   Temp 98.6 F (37 C) (Oral)   Wt 145 lb 3.2 oz (65.9 kg)   BMI 25.72 kg/m   Body mass index is 25.72 kg/m.  GENERAL: vitals reviewed and listed above, alert, oriented, appears well hydrated and in no acute distress HEENT: atraumatic, conjunctiva  clear,  no obvious abnormalities on inspection of external nose and ears CV: HRRR, no clubbing cyanosis or  peripheral edema nl cap refill  Abdomen:  Sof,t normal bowel sounds without hepatosplenomegaly, no guarding rebound or masses no CVA tenderness Declines recto anal exam today MS: moves all extremities without noticeable focal  abnormality PSYCH: pleasant and cooperative, no obvious depression or anxiety  ASSESSMENT AND PLAN:  Discussed the following assessment and plan:  Bright red rectal bleeding - on going for a week  - Plan: POCT hemoglobin, Ambulatory referral to Gastroenterology  Rectal bleeding - Plan: POCT hemoglobin, Ambulatory referral to Gastroenterology  -Patient advised to return or notify health care team  if symptoms worsen ,persist or new concerns arise.  Patient Instructions  Hemoglobin is stable    Want you to see the GI Dr Blanch Media team  For evaluation sometimes polyp or  Internal hemorrhoids could do this  Stop the asa temporarily    r\ Rectal Bleeding Rectal bleeding is when blood passes out of the anus. People with rectal bleeding may notice bright red blood in their underwear or in the toilet after having a bowel movement. They may also have dark red or black stools. Rectal bleeding is usually a sign that something is wrong. Many things can cause rectal bleeding, including:  Hemorrhoids. These are blood vessels in the anus or rectum that are larger than normal.  Fistulas. These are abnormal passages in the rectum and anus.  Anal fissures. This is a tear in the anus.  Diverticulosis. This is a condition in which pockets or sacs project from the bowel.  Proctitis and colitis. These are conditions in which the rectum, colon, or anus become inflamed.  Polyps. These are growths that can be cancerous (malignant) or non-cancerous (benign).  Part of the rectum sticking out from the anus (rectal prolapse).  Certain medicines.  Intestinal infections.  Follow  these instructions at home: Pay attention to any changes in your symptoms. Take these actions to help lessen bleeding and discomfort:  Eat a diet that is high in fiber. This will keep your stool soft, making it easier to pass stools without straining. Ask your health care provider what foods and drinks are high in fiber.  Drink enough fluid to  keep your urine clear or pale yellow. This also helps to keep your stool soft.  Try taking a warm bath. This may help soothe any pain in your rectum.  Keep all follow-up visits as told by your health care provider. This is important.  Get help right away if:  You have new or increased rectal bleeding.  You have black or dark red stools.  You vomit blood or something that looks like coffee grounds.  You have pain or tenderness in your abdomen.  You have a fever.  You feel weak.  You feel nauseous.  You faint.  You have severe pain in your rectum.  You cannot have a bowel movement. This information is not intended to replace advice given to you by your health care provider. Make sure you discuss any questions you have with your health care provider. Document Released: 10/14/2001 Document Revised: 09/30/2015 Document Reviewed: 06/20/2015 Elsevier Interactive Patient Education  2018 [place]. Arabell Neria M.D.

## 2017-02-23 NOTE — Telephone Encounter (Signed)
Refill request for Medication: Cyclobenzaprine 10mg  - Sig: TAKE ONE-HALF TO ONE TABLET BY MOUTH THREE TIMES DAILY AS NEEDED FOR MUSCLE SPASM. Last Filled: 03/17/16, #30 x 3 refills Previous / Upcoming Appt: 02/26/17 Acute and 06/21/17 Physical  Please advise Dr Regis Bill, thanks.

## 2017-02-23 NOTE — Telephone Encounter (Signed)
Patient aware. Rx sent to [facility]. Nothing further needed.

## 2017-02-25 IMAGING — DX DG CHEST 2V
2 series · 2 of 2 positions shown · non-contrast
Comparison: Chest radiograph and chest CT April 24, 2015

CLINICAL DATA: Cough for 7 days.  History of tobacco use.

EXAM:
CHEST  2 VIEW

[chest pa]
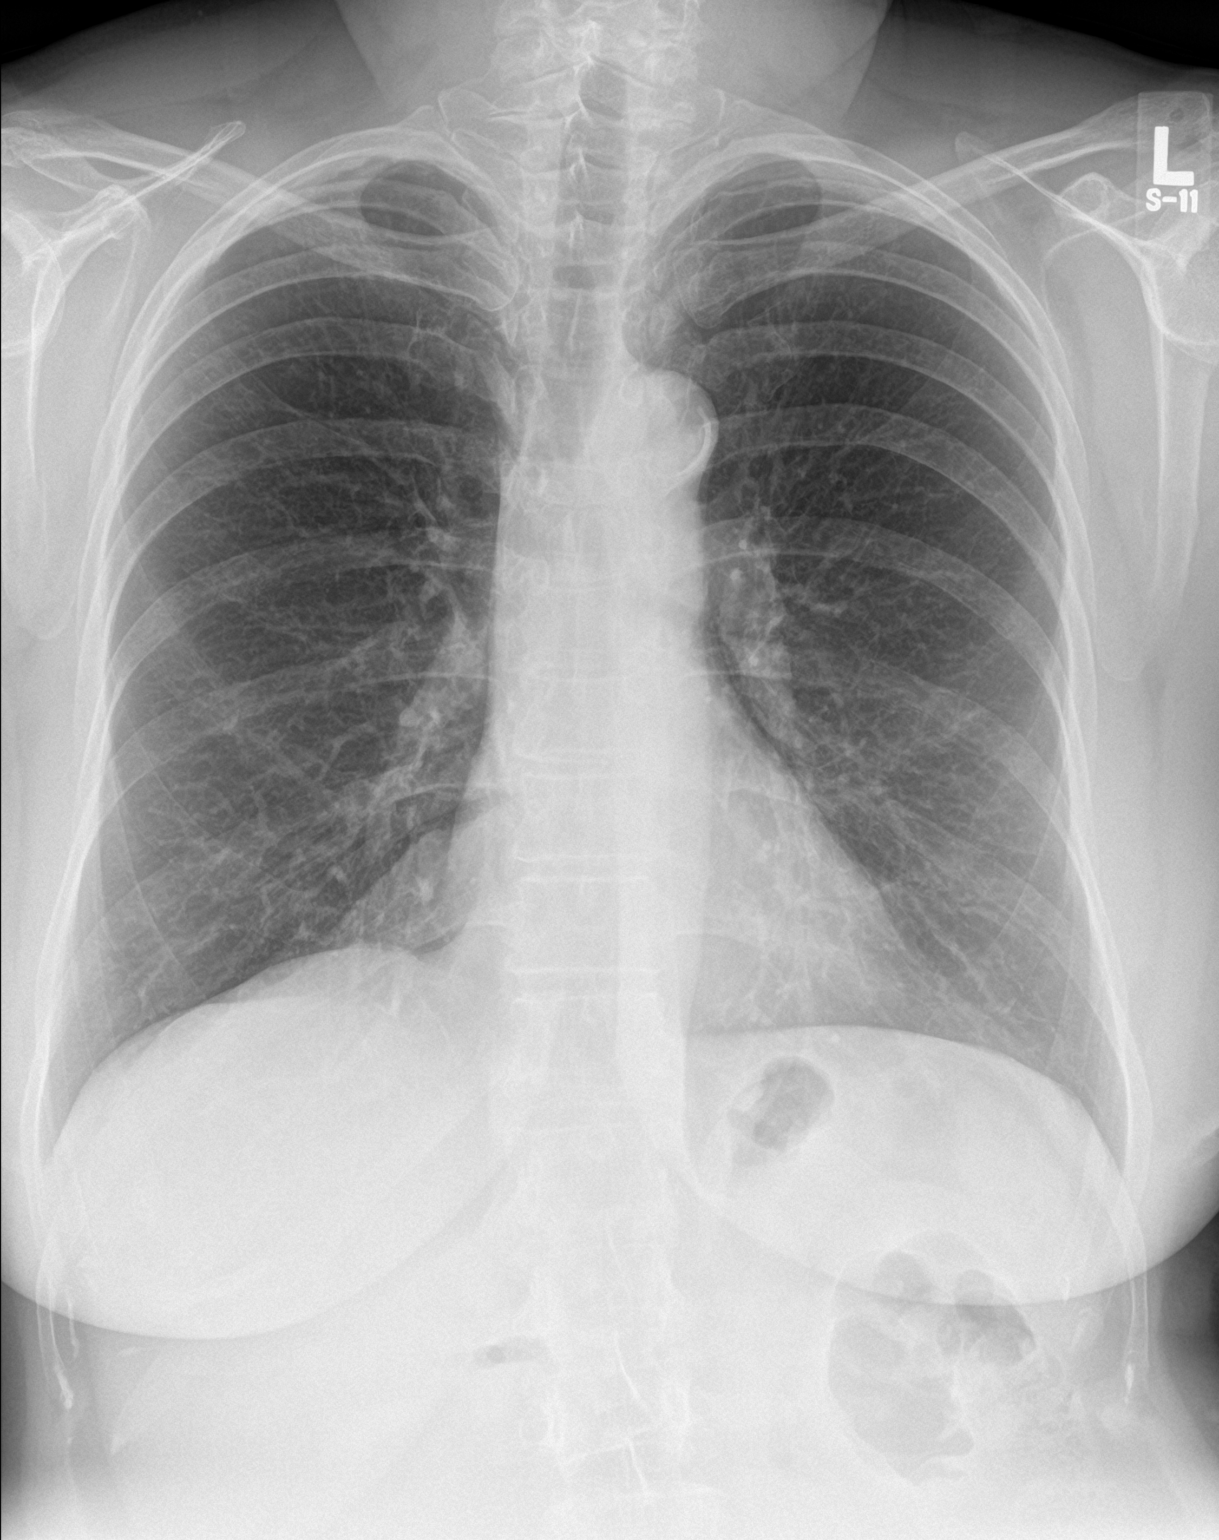

[chest lat]
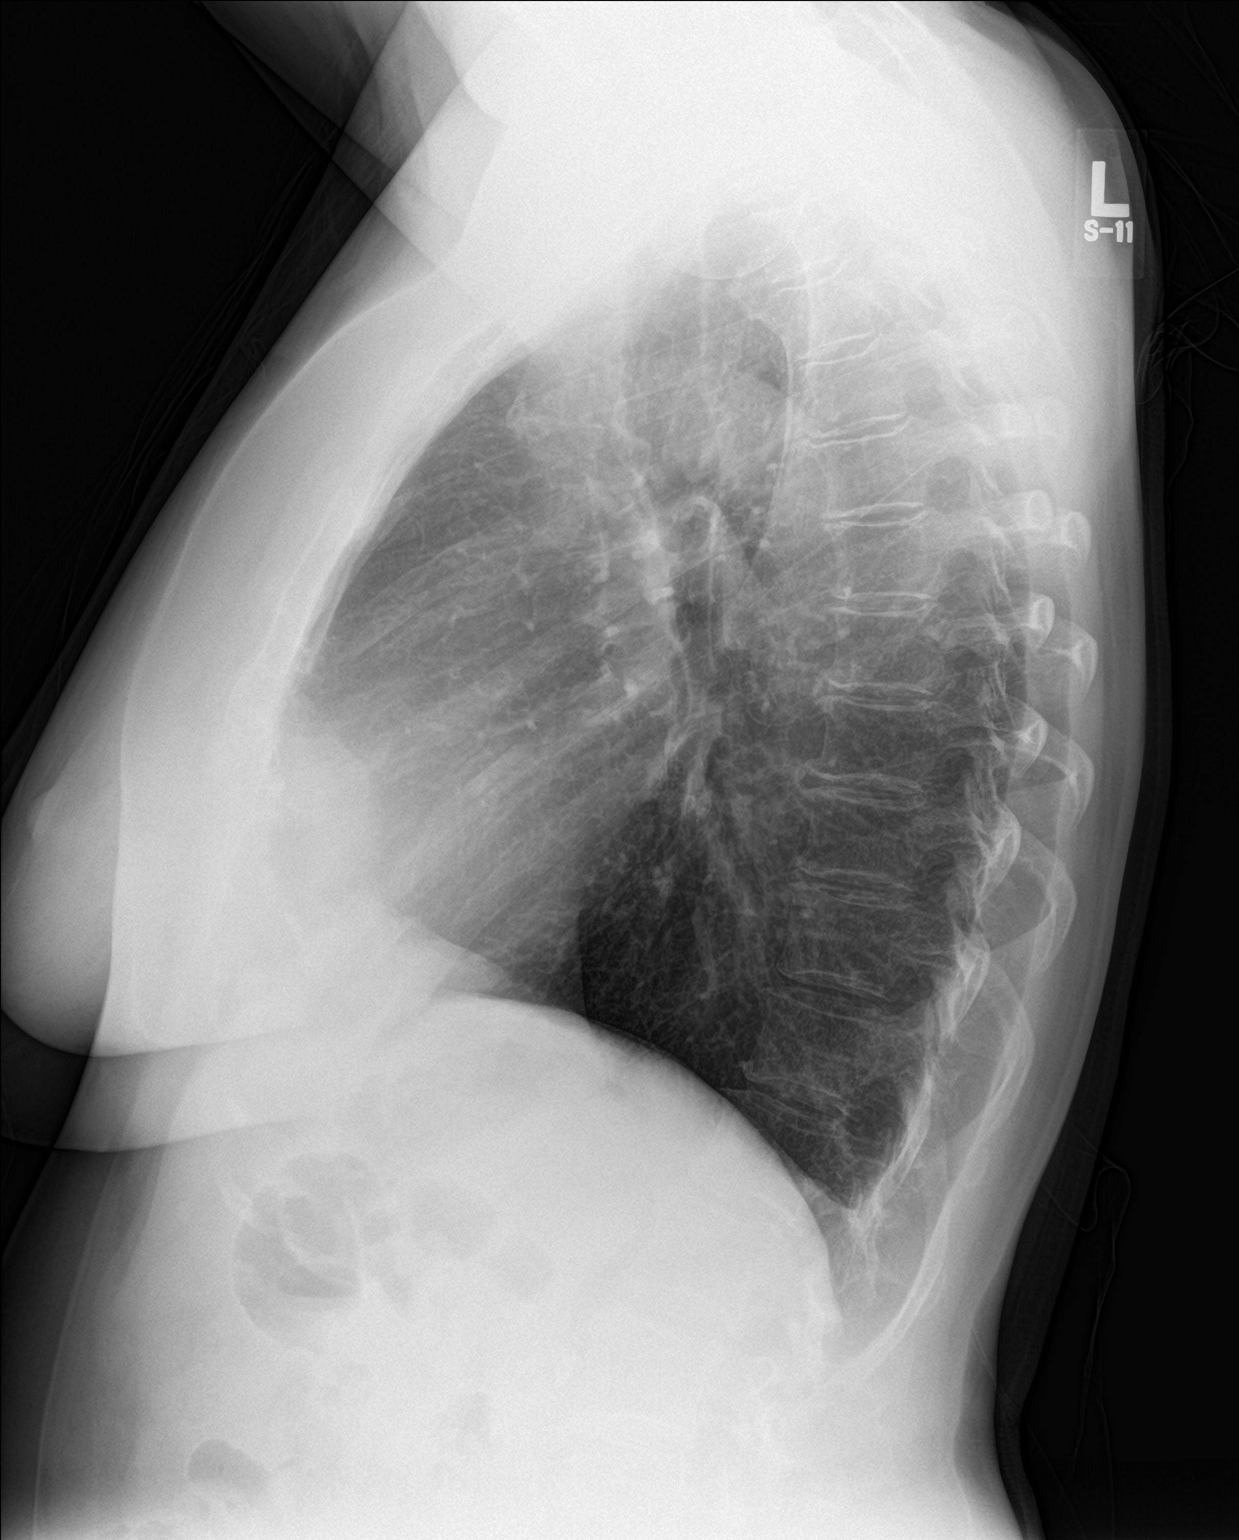

[2 of 2 positions shown; findings below may reference images not displayed]

FINDINGS: There is no edema or consolidation. The heart size and pulmonary
vascularity are normal. No adenopathy. There is atherosclerotic
calcification in the aortic arch. There is slight degenerative
change in the thoracic spine.
IMPRESSION: Aortic atherosclerosis.  No edema or consolidation.

## 2017-02-26 ENCOUNTER — Encounter: Payer: Self-pay | Admitting: Internal Medicine

## 2017-02-26 ENCOUNTER — Ambulatory Visit (INDEPENDENT_AMBULATORY_CARE_PROVIDER_SITE_OTHER): Payer: Medicare Other | Admitting: Internal Medicine

## 2017-02-26 VITALS — BP 128/68 | HR 85 | Temp 98.6°F | Wt 145.2 lb

## 2017-02-26 DIAGNOSIS — K625 Hemorrhage of anus and rectum: Secondary | ICD-10-CM | POA: Diagnosis not present

## 2017-02-26 LAB — POCT HEMOGLOBIN: Hemoglobin: 14 g/dL (ref 12.2–16.2)

## 2017-02-26 NOTE — Progress Notes (Signed)
Patient has an appt with Nicoletta Ba PA 03/07/17

## 2017-02-26 NOTE — Patient Instructions (Addendum)
Hemoglobin is stable    Want you to see the GI Dr Blanch Media team  For evaluation sometimes polyp or  Internal hemorrhoids could do this  Stop the asa temporarily    r\ Rectal Bleeding Rectal bleeding is when blood passes out of the anus. People with rectal bleeding may notice bright red blood in their underwear or in the toilet after having a bowel movement. They may also have dark red or black stools. Rectal bleeding is usually a sign that something is wrong. Many things can cause rectal bleeding, including:  Hemorrhoids. These are blood vessels in the anus or rectum that are larger than normal.  Fistulas. These are abnormal passages in the rectum and anus.  Anal fissures. This is a tear in the anus.  Diverticulosis. This is a condition in which pockets or sacs project from the bowel.  Proctitis and colitis. These are conditions in which the rectum, colon, or anus become inflamed.  Polyps. These are growths that can be cancerous (malignant) or non-cancerous (benign).  Part of the rectum sticking out from the anus (rectal prolapse).  Certain medicines.  Intestinal infections.  Follow these instructions at home: Pay attention to any changes in your symptoms. Take these actions to help lessen bleeding and discomfort:  Eat a diet that is high in fiber. This will keep your stool soft, making it easier to pass stools without straining. Ask your health care provider what foods and drinks are high in fiber.  Drink enough fluid to keep your urine clear or pale yellow. This also helps to keep your stool soft.  Try taking a warm bath. This may help soothe any pain in your rectum.  Keep all follow-up visits as told by your health care provider. This is important.  Get help right away if:  You have new or increased rectal bleeding.  You have black or dark red stools.  You vomit blood or something that looks like coffee grounds.  You have pain or tenderness in your abdomen.  You  have a fever.  You feel weak.  You feel nauseous.  You faint.  You have severe pain in your rectum.  You cannot have a bowel movement. This information is not intended to replace advice given to you by your health care provider. Make sure you discuss any questions you have with your health care provider. Document Released: 10/14/2001 Document Revised: 09/30/2015 Document Reviewed: 06/20/2015 Elsevier Interactive Patient Education  [employer].

## 2017-03-07 ENCOUNTER — Ambulatory Visit (INDEPENDENT_AMBULATORY_CARE_PROVIDER_SITE_OTHER): Payer: Medicare Other | Admitting: Physician Assistant

## 2017-03-07 ENCOUNTER — Encounter: Payer: Self-pay | Admitting: Physician Assistant

## 2017-03-07 VITALS — BP 122/70 | HR 68 | Ht 63.5 in | Wt 146.0 lb

## 2017-03-07 DIAGNOSIS — K5730 Diverticulosis of large intestine without perforation or abscess without bleeding: Secondary | ICD-10-CM

## 2017-03-07 DIAGNOSIS — Z86010 Personal history of colonic polyps: Secondary | ICD-10-CM | POA: Diagnosis not present

## 2017-03-07 DIAGNOSIS — K625 Hemorrhage of anus and rectum: Secondary | ICD-10-CM | POA: Diagnosis not present

## 2017-03-07 DIAGNOSIS — K648 Other hemorrhoids: Secondary | ICD-10-CM | POA: Diagnosis not present

## 2017-03-07 NOTE — Patient Instructions (Signed)
Use Preperation H as needed,  Call us for any problems and recurrent bleeding.  Ask to speak to Amy or Dr. Blanch Media nurse.

## 2017-03-07 NOTE — Progress Notes (Signed)
Subjective:    Patient ID: Jacqueline Myers, female    DOB: [birth year], 66 y.o.   MRN: [id number]  HPI Jacqueline Myers  is a pleasant 66 year old white female, known to Dr. Henrene Myers who is referred today by Dr. Regis Myers for evaluation of recent rectal bleeding. Patient has history of hypertension, osteoarthritis and spinal stenosis. She has history of GERD with stricture and underwent EGD with dilation in November 2016. Her last colonoscopy was September 2014 done for history of polyps and she was found to have 2 diminutive polyps in the sigmoid colon both of which were hyperplastic, moderate diverticulosis and internal hemorrhoids. She was indicated for 10 year interval follow-up. Patient says she has not had any problems with hemorrhoids since around her pregnancies many years ago and says those were external. She says she had an episode of prolonged rectal bleeding that started on October 15 the Monday after she had had a very stressful weekend with her son getting married.. She says that Monday morning she had a bowel movement and noticed a lot of red blood. She feels that she passed blood several times that day. She did not have any associated abdominal cramping or pain and no rectal pain or discomfort. Every time she had a bowel movement she passed blood and even when urinating would pass blood which would be dripping into the commode. She feels that the stool was fairly normal-appearing. She had continuation of some bleeding with bowel movements over the next 10 days and then the bleeding stopped. She was seen by Dr. Regis Myers on 02/26/2017, hemoglobin was done and normal at 14. Bleeding was felt most likely hemorrhoidal. She did try some Preparation H suppositories but did not feel that made much difference. At this point she has not had any bleeding at all over the past 4 days and feels fine.  Review of Systems Pertinent positive and negative review of systems were noted in the above HPI section.  All other  review of systems was otherwise negative.  Outpatient Encounter Prescriptions as of 03/07/2017  Medication Sig  . acetaminophen (TYLENOL) 500 MG tablet Take 500-1,000 mg by mouth every 6 (six) hours as needed for moderate pain.  . Ascorbic Acid (VITAMIN C) 1000 MG tablet Take 1,000 mg by mouth daily.  Marland Kitchen aspirin EC 81 MG tablet Take 81 mg by mouth daily.  . Candesartan Cilexetil-HCTZ 32-25 MG TABS Take 1 tablet by mouth daily.  . citalopram (CELEXA) 10 MG tablet TAKE 1 & 1/2 (ONE & ONE-HALF) TABLETS BY MOUTH ONCE DAILY  . cyclobenzaprine (FLEXERIL) 10 MG tablet TAKE ONE-HALF TO ONE TABLET BY MOUTH THREE TIMES DAILY AS NEEDED FOR MUSCLE SPASM  . diclofenac (VOLTAREN) 75 MG EC tablet Take 1 tablet (75 mg total) by mouth 2 (two) times daily.  . diclofenac sodium (VOLTAREN) 1 % GEL Apply 4 g topically 4 (four) times daily.  . DOCOSAHEXAENOIC ACID PO Take 1 g by mouth.  Marland Kitchen LORazepam (ATIVAN) 1 MG tablet Take 0.5-1 tablets (0.5-1 mg total) by mouth every 8 (eight) hours as needed for anxiety.  . Multiple Vitamins-Minerals (PRESERVISION AREDS PO) Take 1 capsule by mouth daily.  . Omega-3 Fatty Acids (FISH OIL) 1200 MG CPDR Take 2 capsules by mouth daily.  . VOLTAREN 1 % GEL APPLY 4 GRAMS TOPICALLY 4 TIMES DAILY AS NEEDED FOR KNEE ARTHRITIS  . rosuvastatin (CRESTOR) 5 MG tablet Take 1 tablet (5 mg total) by mouth daily. PLEASE GIVE 30 DAY SUPPLY UPON PATIENT REQUEST   No  facility-administered encounter medications on file as of 03/07/2017.    Allergies  Allergen Reactions  . Statins Other (See Comments)    Joint pain. Patient denies being allergic to anything else.simvastatin   . Lisinopril Cough    Tolerates losartan  . Oxycodone Nausea Only and Palpitations    Change in mental status, camps, anxiety, chills  . Simvastatin Other (See Comments)    Myalgias   Patient Active Problem List   Diagnosis Date Noted  . Rectal bleeding 02/23/2017  . Chest wall pain 12/16/2016  . Accidental fall  12/16/2016  . OA (osteoarthritis) of knee 04/14/2015  . S/P total knee arthroplasty 04/14/2015  . Primary osteoarthritis of both knees 02/08/2015  . Cough due to ACE inhibitor 09/02/2014  . Cough, persistent 07/05/2014  . Dyspnea 07/05/2014  . Insomnia 07/05/2014  . Hx of tobacco use 07/05/2014  . Stress due to illness of family member 07/05/2014  . Hyperlipidemia 07/05/2014  . Essential hypertension 07/05/2014  . Trigger finger, acquired 06/28/2012  . Routine gynecological examination 06/28/2012  . Visit for preventive health examination 07/01/2011  . Knee pain, left 07/01/2011  . Voiding dysfunction 07/01/2011  . Vaginal atrophy 06/28/2011  . Counseling on health promotion and disease prevention 03/20/2011  . BACK PAIN 03/02/2009  . URINARY URGENCY 03/02/2009  . GANGLION CYST 09/13/2007  . OSTEOPENIA 09/13/2007  . OSTEOARTHRITIS 03/15/2007  . HYPERTENSION 01/28/2007  . SPINAL STENOSIS 01/28/2007  . HYPERLIPIDEMIA 01/18/2007   Social History   Social History  . Marital status: Married    Spouse name: N/A  . Number of children: 2  . Years of education: N/A   Occupational History  . Retired    Social History Main Topics  . Smoking status: Former Smoker    Packs/day: 1.00    Years: 29.00    Types: Cigarettes    Quit date: 05/09/1999  . Smokeless tobacco: Never Used  . Alcohol use 0.0 oz/week     Comment: OCC- GLASS OF WINE  . Drug use: No  . Sexual activity: Not on file   Other Topics Concern  . Not on file   Social History Narrative   Married   Jacqueline Myers of 2 helps take care 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   Neg td neg ets    Ms. Jacqueline Myers's family history includes Alcohol abuse in her mother; Breast cancer in her sister; Diabetes in her mother; Hyperlipidemia in her father and mother; Hypertension in her mother; Stroke in her  mother.      Objective:    Vitals:   03/07/17 0958  BP: 122/70  Pulse: 68    Physical Exam well-developed older white female in no acute distress, very pleasant blood pressure 122/70, pulse 68, BMI 25.4. HEENT; nontraumatic normocephalic EOMI PERRLA sclera anicteric, Cardiovascular ;regular rate and rhythm with S1-S2 no murmur rub or gallop, Pulmonary ;clear bilaterally, Abdomen; soft, nontender nondistended bowel sounds are active no palpable mass or hepatosplenomegaly, Rectal; exam not done, Extremities; no clubbing cyanosis or edema skin warm and dry, Neuropsych; mood and affect appropriate       Assessment & Plan:   #72 66 year old white female with recent 10 day episode of bright red blood per rectum with bowel movements, normal hemoglobin of 14, a week after bleeding started. Bleeding has subsided over the past 5 days. Bleeding very likely secondary to  previously documented internal hemorrhoids. It is certainly possible that she had some self-limited diverticular bleeding though with some bleeding evident over 10 days would have expected her to develop anemia or become more symptomatic #2 history of adenomatous colon polyps, last colonoscopy September 2014 with finding of 2 diminutive hyperplastic polyps due for interval follow-up 2024 #3 diverticulosis #4 osteoarthritis next #5 hypertension #6 GERD with stricture and prior dilation  Plan; Observation for now. Patient will use Preparation H suppositories daily at bedtime for 7 days if she has any recurrence of small-volume bleeding. I have asked her to call should she have any recurrence of any larger amounts of bleeding. She will follow up with Dr.perry  or myself on an as-needed basis and will plan colonoscopy for 2024.  Jacqueline Cinco S Vannary Greening PA-C 03/07/2017   Cc: Burnis Medin, MD

## 2017-03-07 NOTE — Progress Notes (Signed)
Initial assessment and plans and recommendations reviewed

## 2017-03-08 DIAGNOSIS — M9903 Segmental and somatic dysfunction of lumbar region: Secondary | ICD-10-CM | POA: Diagnosis not present

## 2017-03-08 DIAGNOSIS — S29012A Strain of muscle and tendon of back wall of thorax, initial encounter: Secondary | ICD-10-CM | POA: Diagnosis not present

## 2017-03-08 DIAGNOSIS — M9902 Segmental and somatic dysfunction of thoracic region: Secondary | ICD-10-CM | POA: Diagnosis not present

## 2017-03-08 DIAGNOSIS — M5137 Other intervertebral disc degeneration, lumbosacral region: Secondary | ICD-10-CM | POA: Diagnosis not present

## 2017-03-08 DIAGNOSIS — M4317 Spondylolisthesis, lumbosacral region: Secondary | ICD-10-CM | POA: Diagnosis not present

## 2017-03-08 DIAGNOSIS — M9904 Segmental and somatic dysfunction of sacral region: Secondary | ICD-10-CM | POA: Diagnosis not present

## 2017-03-15 ENCOUNTER — Encounter: Payer: Self-pay | Admitting: Internal Medicine

## 2017-03-15 DIAGNOSIS — G8929 Other chronic pain: Secondary | ICD-10-CM

## 2017-03-15 DIAGNOSIS — M549 Dorsalgia, unspecified: Principal | ICD-10-CM

## 2017-03-16 NOTE — Telephone Encounter (Signed)
Some people get help with acupuncture   Some physical therapy areas have certified   Person , dont know if paid for my insurance

## 2017-03-16 NOTE — Telephone Encounter (Signed)
Please advise Dr Panosh, thanks.   

## 2017-03-21 ENCOUNTER — Encounter: Payer: Self-pay | Admitting: Internal Medicine

## 2017-03-23 NOTE — Telephone Encounter (Signed)
Patient not interested in acupuncture and is requesting a Neurology referral. Please advise Dr Regis Bill, thanks.

## 2017-03-26 ENCOUNTER — Encounter: Payer: Self-pay | Admitting: Internal Medicine

## 2017-03-27 NOTE — Telephone Encounter (Signed)
Message has been handled in previous Mychart encounter.

## 2017-03-27 NOTE — Telephone Encounter (Signed)
Pt has changed her mind about the type of referral she wants...  From patient today via MyChart: I have reconsidered my options and feel I need an orthopedicdr instead of a neurologist . Please cancel the request for a referral. Thank you , Jacqueline Myers  Please advise Dr Regis Bill, thanks.

## 2017-03-28 NOTE — Telephone Encounter (Signed)
Ok to do referral as  Patient requested

## 2017-03-31 ENCOUNTER — Other Ambulatory Visit: Payer: Self-pay | Admitting: Internal Medicine

## 2017-04-04 NOTE — Telephone Encounter (Signed)
Referral placed.  Patient notified. Nothing further needed.

## 2017-04-06 DIAGNOSIS — G8929 Other chronic pain: Secondary | ICD-10-CM | POA: Diagnosis not present

## 2017-04-06 DIAGNOSIS — M25552 Pain in left hip: Secondary | ICD-10-CM | POA: Diagnosis not present

## 2017-04-06 DIAGNOSIS — M545 Low back pain: Secondary | ICD-10-CM | POA: Diagnosis not present

## 2017-04-13 ENCOUNTER — Telehealth: Payer: Self-pay | Admitting: *Deleted

## 2017-04-13 DIAGNOSIS — M545 Low back pain: Secondary | ICD-10-CM | POA: Diagnosis not present

## 2017-04-13 DIAGNOSIS — G8929 Other chronic pain: Secondary | ICD-10-CM | POA: Diagnosis not present

## 2017-04-13 NOTE — Telephone Encounter (Signed)
Prior auth for Citalopram sent to [email or web address].

## 2017-04-19 DIAGNOSIS — M545 Low back pain: Secondary | ICD-10-CM | POA: Diagnosis not present

## 2017-04-19 DIAGNOSIS — G8929 Other chronic pain: Secondary | ICD-10-CM | POA: Diagnosis not present

## 2017-05-09 ENCOUNTER — Other Ambulatory Visit: Payer: Self-pay | Admitting: Internal Medicine

## 2017-05-09 DIAGNOSIS — M48061 Spinal stenosis, lumbar region without neurogenic claudication: Secondary | ICD-10-CM | POA: Diagnosis not present

## 2017-05-09 DIAGNOSIS — M545 Low back pain: Secondary | ICD-10-CM | POA: Diagnosis not present

## 2017-05-09 DIAGNOSIS — M4310 Spondylolisthesis, site unspecified: Secondary | ICD-10-CM | POA: Diagnosis not present

## 2017-05-09 NOTE — Telephone Encounter (Signed)
I called [facility] and informed Jacqueline Myers the Rx was approved as below:  PA Case: [id number], Status: Approved, Coverage Starts on: 04/13/2017 12:00:00 AM, Coverage Ends on: 04/13/2018 12:00:00 AM. Questions? Contact [phone].

## 2017-05-29 ENCOUNTER — Other Ambulatory Visit: Payer: Self-pay | Admitting: Internal Medicine

## 2017-05-30 DIAGNOSIS — R69 Illness, unspecified: Secondary | ICD-10-CM | POA: Diagnosis not present

## 2017-06-04 DIAGNOSIS — M48061 Spinal stenosis, lumbar region without neurogenic claudication: Secondary | ICD-10-CM | POA: Diagnosis not present

## 2017-06-04 DIAGNOSIS — M545 Low back pain: Secondary | ICD-10-CM | POA: Diagnosis not present

## 2017-06-04 DIAGNOSIS — M4316 Spondylolisthesis, lumbar region: Secondary | ICD-10-CM | POA: Diagnosis not present

## 2017-06-07 DIAGNOSIS — M48061 Spinal stenosis, lumbar region without neurogenic claudication: Secondary | ICD-10-CM | POA: Diagnosis not present

## 2017-06-07 DIAGNOSIS — M545 Low back pain: Secondary | ICD-10-CM | POA: Diagnosis not present

## 2017-06-07 DIAGNOSIS — M4316 Spondylolisthesis, lumbar region: Secondary | ICD-10-CM | POA: Diagnosis not present

## 2017-06-07 DIAGNOSIS — M5136 Other intervertebral disc degeneration, lumbar region: Secondary | ICD-10-CM | POA: Diagnosis not present

## 2017-06-14 DIAGNOSIS — M48061 Spinal stenosis, lumbar region without neurogenic claudication: Secondary | ICD-10-CM | POA: Diagnosis not present

## 2017-06-20 NOTE — Progress Notes (Signed)
Chief Complaint  Patient presents with  . Annual Exam    No new concerns    HPI: Jacqueline Myers 67 y.o. comes in today for Preventive Medicare exam/ wellness visit .Since last visit.  And Chronic disease management   Back   eval [place] pt and injection.   Pain mostly after standing a lot  No chonrnic orall nsaids ocass flexeril   bp controlled   hld no se of med  Anxiety doing well on citalopram 15  No use of ativan  Has as s=rescue .   Health Maintenance  Topic Date Due  . MAMMOGRAM  07/14/2018  . COLONOSCOPY  01/17/2023  . TETANUS/TDAP  07/03/2024  . INFLUENZA VACCINE  Completed  . DEXA SCAN  Completed  . Hepatitis C Screening  Completed  . PNA vac Low Risk Adult  Completed   Health Maintenance Review LIFESTYLE:  Exercise:   Joined gyme January silver sneakers.   3 x per week.  Tobacco/ETS:n Alcohol:  ocass Sugar beverages: no Sleep: 5-6  Drug use: no HH:2 mom next door     Hearing:  Ok   Vision:  No limitations at present . Last eye check UTD  Safety:  Has smoke detector and wears seat belts.  No firearms. No excess sun exposure. Sees dentist regularly.  Falls:  no  Memory: Felt to be good  , no concern from her or her family.  Depression: No anhedonia unusual crying or depressive symptoms  Nutrition: Eats well balanced diet; adequate calcium and vitamin D. No swallowing chewing problems.  Injury: no major injuries in the last six months.  Other healthcare providers:  Reviewed today .  Social:  Lives with spouse married. No pets.   Preventive parameters: up-to-date  Reviewed   ADLS:   There are no problems or need for assistance  driving, feeding, obtaining food, dressing, toileting and bathing, managing money using phone. She is independent.   ROS:  GEN/ HEENT: No fever, significant weight changes sweats headaches vision problems hearing changes, CV/ PULM; No chest pain shortness of breath cough, syncope,edema  change in exercise  tolerance. GI /GU: No adominal pain, vomiting, change in bowel habits. No blood in the stool. No significant GU symptoms. SKIN/HEME: ,no acute skin rashes suspicious lesions or bleeding. No lymphadenopathy, nodules, masses.  NEURO/ PSYCH:  No neurologic signs such as weakness numbness. No depression anxiety. IMM/ Allergy: No unusual infections.  Allergy .   REST of 12 system review negative except as per HPI   Past Medical History:  Diagnosis Date  . Colon polyps    hyperplastic  . Diverticulosis   . Esophageal stricture   . GERD (gastroesophageal reflux disease)   . History of bronchitis   . Hyperlipidemia   . Hypertension   . Mitral valve prolapse   . Osteoarthritis   . Osteoporosis   . Spinal stenosis     Family History  Problem Relation Age of Onset  . Breast cancer Sister   . Alcohol abuse Mother   . Stroke Mother   . Hypertension Mother   . Diabetes Mother   . Hyperlipidemia Mother   . Hyperlipidemia Father   . Colon cancer Neg Hx     Social History   Socioeconomic History  . Marital status: Married    Spouse name: None  . Number of children: 2  . Years of education: None  . Highest education level: None  Social Needs  . Financial resource strain: None  .  Food insecurity - worry: None  . Food insecurity - inability: None  . Transportation needs - medical: None  . Transportation needs - non-medical: None  Occupational History  . Occupation: Retired  Tobacco Use  . Smoking status: Former Smoker    Packs/day: 1.00    Years: 29.00    Pack years: 29.00    Types: Cigarettes    Last attempt to quit: 05/09/1999    Years since quitting: 18.1  . Smokeless tobacco: Never Used  Substance and Sexual Activity  . Alcohol use: Yes    Alcohol/week: 0.0 oz    Comment: OCC- GLASS OF WINE  . Drug use: No  . Sexual activity: None  Other Topics Concern  . None  Social History Narrative   Married   HH of 2 helps take care 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   Neg td neg ets    Outpatient Encounter Medications as of 06/21/2017  Medication Sig  . diclofenac sodium (VOLTAREN) 1 % GEL Apply 4 g topically 4 (four) times daily.  Marland Kitchen acetaminophen (TYLENOL) 500 MG tablet Take 500-1,000 mg by mouth every 6 (six) hours as needed for moderate pain.  . Ascorbic Acid (VITAMIN C) 1000 MG tablet Take 1,000 mg by mouth daily.  Marland Kitchen aspirin EC 81 MG tablet Take 81 mg by mouth daily.  . Candesartan Cilexetil-HCTZ 32-25 MG TABS TAKE 1 TABLET BY MOUTH ONCE DAILY  . citalopram (CELEXA) 10 MG tablet TAKE 1 & 1/2 (ONE & ONE-HALF) TABLETS BY MOUTH ONCE DAILY  . cyclobenzaprine (FLEXERIL) 10 MG tablet TAKE ONE-HALF TO ONE TABLET BY MOUTH THREE TIMES DAILY AS NEEDED FOR MUSCLE SPASM  . DOCOSAHEXAENOIC ACID PO Take 1 g by mouth.  Marland Kitchen LORazepam (ATIVAN) 1 MG tablet Take 0.5-1 tablets (0.5-1 mg total) by mouth every 8 (eight) hours as needed for anxiety.  . Multiple Vitamins-Minerals (PRESERVISION AREDS PO) Take 1 capsule by mouth daily.  . Omega-3 Fatty Acids (FISH OIL) 1200 MG CPDR Take 2 capsules by mouth daily.  . rosuvastatin (CRESTOR) 5 MG tablet TAKE 1 TABLET BY MOUTH DAILY  . [DISCONTINUED] diclofenac (VOLTAREN) 75 MG EC tablet Take 1 tablet (75 mg total) by mouth 2 (two) times daily. (Patient not taking: Reported on 06/21/2017)  . [DISCONTINUED] VOLTAREN 1 % GEL APPLY 4 GRAMS TOPICALLY 4 TIMES DAILY AS NEEDED FOR KNEE ARTHRITIS (Patient not taking: Reported on 06/21/2017)   No facility-administered encounter medications on file as of 06/21/2017.     EXAM:  BP 128/70 (BP Location: Right Arm, Patient Position: Sitting, Cuff Size: Normal)   Pulse 84   Temp 98.5 F (36.9 C) (Oral)   Ht 5' 2.5" (1.588 m)   Wt 150 lb 14.4 oz (68.4 kg)   BMI 27.16 kg/m   Body mass index is 27.16 kg/m.  Physical Exam: Vital signs reviewed OIZ:TIWP is a  well-developed well-nourished alert cooperative   who appears stated age in no acute distress.  HEENT: normocephalic atraumatic , Eyes: PERRL EOM's full, conjunctiva clear, Nares: paten,t no deformity discharge or tenderness., Ears: no deformity EAC's clear TMs with normal landmarks. Mouth: clear OP, no lesions, edema.  Moist mucous membranes. Dentition in adequate repair. NECK: supple without masses, thyromegaly or bruits. CHEST/PULM:  Clear to auscultation and percussion breath sounds equal no wheeze , rales or rhonchi. No chest wall deformities or tenderness.  CV: PMI is nondisplaced, S1 S2 no gallops, murmurs, rubs. Peripheral pulses are full without delay.No JVD .  ABDOMEN: Bowel sounds normal nontender  No guard or rebound, no hepato splenomegal no CVA tenderness.   Extremtities:  No clubbing cyanosis or edema, no acute joint swelling or redness no focal atrophy NEURO:  Oriented x3, cranial nerves 3-12 appear to be intact, no obvious focal weakness,gait within normal limits no abnormal reflexes or asymmetrical SKIN: No acute rashes normal turgor, color, no bruising or petechiae. PSYCH: Oriented, good eye contact, no obvious depression anxiety, cognition and judgment appear normal. LN: no cervical axillary inguinal adenopathy No noted deficits in memory, attention, and speech.   Lab Results  Component Value Date   WBC 8.4 06/21/2015   HGB 14.0 02/26/2017   HCT 42.7 06/21/2015   PLT 291.0 06/21/2015   GLUCOSE 102 (H) 12/25/2016   CHOL 143 12/25/2016   TRIG 131.0 12/25/2016   HDL 51.00 12/25/2016   LDLDIRECT 127.6 06/23/2013   LDLCALC 65 12/25/2016   ALT 18 03/27/2016   AST 20 03/27/2016   NA 141 12/25/2016   K 3.8 12/25/2016   CL 104 12/25/2016   CREATININE 0.86 12/25/2016   BUN 23 12/25/2016   CO2 27 12/25/2016   TSH 2.14 06/20/2016   INR 0.93 04/08/2015   HGBA1C 6.1 06/20/2016    ASSESSMENT AND PLAN:  Discussed the following assessment and plan:  Visit for preventive  health examination  Medication management - controlled  - Plan: Basic metabolic panel, CBC with Differential/Platelet, Hemoglobin A1c, Hepatic function panel, Lipid panel, TSH  Essential hypertension - Plan: Basic metabolic panel, CBC with Differential/Platelet, Hemoglobin A1c, Hepatic function panel, Lipid panel, TSH  Chronic back pain, unspecified back location, unspecified back pain laterality - under care  pt etc  - Plan: Basic metabolic panel, CBC with Differential/Platelet, Hemoglobin A1c, Hepatic function panel, Lipid panel, TSH  Hyperlipidemia, unspecified hyperlipidemia type - Plan: Basic metabolic panel, CBC with Differential/Platelet, Hemoglobin A1c, Hepatic function panel, Lipid panel, TSH  Fasting hyperglycemia - Plan: Basic metabolic panel, CBC with Differential/Platelet, Hemoglobin A1c, Hepatic function panel, Lipid panel, TSH Doing much better and  controlled parameters  Back is the problematic  Issue under care .  Monitoring labs  Disc shingrix   No need for pap not high risk .  If all ok yearly check up  Patient Care Team: Laretta Pyatt, Standley Brooking, MD as PCP - General Almedia Balls, MD (Orthopedic Surgery) Irene Shipper, MD (Gastroenterology) Gaynelle Arabian, MD as Consulting Physician (Orthopedic Surgery) Susa Day, MD as Consulting Physician (Orthopedic Surgery)  Patient Instructions  Glad you are doing well .   Will notify you  of labs when available. Continue lifestyle intervention healthy eating and exercise .    Yearly flu vaccine  In future  shingrix new shingles vaccine.   Yearly check up and labs  If all ok    Health Maintenance, Female Adopting a healthy lifestyle and getting preventive care can go a long way to promote health and wellness. Talk with your health care provider about what schedule of regular examinations is right for you. This is a good chance for you to check in with your provider about disease prevention and staying healthy. In between  checkups, there are plenty of things you can do on your own. Experts have done a lot of research about which lifestyle changes and preventive measures are most likely to keep you healthy. Ask your health care provider for more information. Weight and  diet Eat a healthy diet  Be sure to include plenty of vegetables, fruits, low-fat dairy products, and lean protein.  Do not eat a lot of foods high in solid fats, added sugars, or salt.  Get regular exercise. This is one of the most important things you can do for your health. ? Most adults should exercise for at least 150 minutes each week. The exercise should increase your heart rate and make you sweat (moderate-intensity exercise). ? Most adults should also do strengthening exercises at least twice a week. This is in addition to the moderate-intensity exercise.  Maintain a healthy weight  Body mass index (BMI) is a measurement that can be used to identify possible weight problems. It estimates body fat based on height and weight. Your health care provider can help determine your BMI and help you achieve or maintain a healthy weight.  For females 64 years of age and older: ? A BMI below 18.5 is considered underweight. ? A BMI of 18.5 to 24.9 is normal. ? A BMI of 25 to 29.9 is considered overweight. ? A BMI of 30 and above is considered obese.  Watch levels of cholesterol and blood lipids  You should start having your blood tested for lipids and cholesterol at 68 years of age, then have this test every 5 years.  You may need to have your cholesterol levels checked more often if: ? Your lipid or cholesterol levels are high. ? You are older than 68 years of age. ? You are at high risk for heart disease.  Cancer screening Lung Cancer  Lung cancer screening is recommended for adults 77-50 years old who are at high risk for lung cancer because of a history of smoking.  A yearly low-dose CT scan of the lungs is recommended for people  who: ? Currently smoke. ? Have quit within the past 15 years. ? Have at least a 30-pack-year history of smoking. A pack year is smoking an average of one pack of cigarettes a day for 1 year.  Yearly screening should continue until it has been 15 years since you quit.  Yearly screening should stop if you develop a health problem that would prevent you from having lung cancer treatment.  Breast Cancer  Practice breast self-awareness. This means understanding how your breasts normally appear and feel.  It also means doing regular breast self-exams. Let your health care provider know about any changes, no matter how small.  If you are in your 20s or 30s, you should have a clinical breast exam (CBE) by a health care provider every 1-3 years as part of a regular health exam.  If you are 3 or older, have a CBE every year. Also consider having a breast X-ray (mammogram) every year.  If you have a family history of breast cancer, talk to your health care provider about genetic screening.  If you are at high risk for breast cancer, talk to your health care provider about having an MRI and a mammogram every year.  Breast cancer gene (BRCA) assessment is recommended for women who have family members with BRCA-related cancers. BRCA-related cancers include: ? Breast. ? Ovarian. ? Tubal. ? Peritoneal cancers.  Results of the assessment will determine the need for genetic counseling and BRCA1 and BRCA2 testing.  Cervical Cancer Your health care provider may recommend that you be screened regularly for cancer of the pelvic organs (ovaries, uterus, and vagina). This screening involves a pelvic examination, including checking for microscopic changes to the  surface of your cervix (Pap test). You may be encouraged to have this screening done every 3 years, beginning at age 41.  For women ages 73-65, health care providers may recommend pelvic exams and Pap testing every 3 years, or they may recommend  the Pap and pelvic exam, combined with testing for human papilloma virus (HPV), every 5 years. Some types of HPV increase your risk of cervical cancer. Testing for HPV may also be done on women of any age with unclear Pap test results.  Other health care providers may not recommend any screening for nonpregnant women who are considered low risk for pelvic cancer and who do not have symptoms. Ask your health care provider if a screening pelvic exam is right for you.  If you have had past treatment for cervical cancer or a condition that could lead to cancer, you need Pap tests and screening for cancer for at least 20 years after your treatment. If Pap tests have been discontinued, your risk factors (such as having a new sexual partner) need to be reassessed to determine if screening should resume. Some women have medical problems that increase the chance of getting cervical cancer. In these cases, your health care provider may recommend more frequent screening and Pap tests.  Colorectal Cancer  This type of cancer can be detected and often prevented.  Routine colorectal cancer screening usually begins at 68 years of age and continues through 68 years of age.  Your health care provider may recommend screening at an earlier age if you have risk factors for colon cancer.  Your health care provider may also recommend using home test kits to check for hidden blood in the stool.  A small camera at the end of a tube can be used to examine your colon directly (sigmoidoscopy or colonoscopy). This is done to check for the earliest forms of colorectal cancer.  Routine screening usually begins at age 52.  Direct examination of the colon should be repeated every 5-10 years through 68 years of age. However, you may need to be screened more often if early forms of precancerous polyps or small growths are found.  Skin Cancer  Check your skin from head to toe regularly.  Tell your health care provider about  any new moles or changes in moles, especially if there is a change in a mole's shape or color.  Also tell your health care provider if you have a mole that is larger than the size of a pencil eraser.  Always use sunscreen. Apply sunscreen liberally and repeatedly throughout the day.  Protect yourself by wearing long sleeves, pants, a wide-brimmed hat, and sunglasses whenever you are outside.  Heart disease, diabetes, and high blood pressure  High blood pressure causes heart disease and increases the risk of stroke. High blood pressure is more likely to develop in: ? People who have blood pressure in the high end of the normal range (130-139/85-89 mm Hg). ? People who are overweight or obese. ? People who are African American.  If you are 77-49 years of age, have your blood pressure checked every 3-5 years. If you are 11 years of age or older, have your blood pressure checked every year. You should have your blood pressure measured twice-once when you are at a hospital or clinic, and once when you are not at a hospital or clinic. Record the average of the two measurements. To check your blood pressure when you are not at a hospital or clinic, you can  use: ? An automated blood pressure machine at a pharmacy. ? A home blood pressure monitor.  If you are between 39 years and 67 years old, ask your health care provider if you should take aspirin to prevent strokes.  Have regular diabetes screenings. This involves taking a blood sample to check your fasting blood sugar level. ? If you are at a normal weight and have a low risk for diabetes, have this test once every three years after 68 years of age. ? If you are overweight and have a high risk for diabetes, consider being tested at a younger age or more often. Preventing infection Hepatitis B  If you have a higher risk for hepatitis B, you should be screened for this virus. You are considered at high risk for hepatitis B if: ? You were born in  a country where hepatitis B is common. Ask your health care provider which countries are considered high risk. ? Your parents were born in a high-risk country, and you have not been immunized against hepatitis B (hepatitis B vaccine). ? You have HIV or AIDS. ? You use needles to inject street drugs. ? You live with someone who has hepatitis B. ? You have had sex with someone who has hepatitis B. ? You get hemodialysis treatment. ? You take certain medicines for conditions, including cancer, organ transplantation, and autoimmune conditions.  Hepatitis C  Blood testing is recommended for: ? Everyone born from [birth year] through 1966. ? Anyone with known risk factors for hepatitis C.  Sexually transmitted infections (STIs)  You should be screened for sexually transmitted infections (STIs) including gonorrhea and chlamydia if: ? You are sexually active and are younger than 68 years of age. ? You are older than 68 years of age and your health care provider tells you that you are at risk for this type of infection. ? Your sexual activity has changed since you were last screened and you are at an increased risk for chlamydia or gonorrhea. Ask your health care provider if you are at risk.  If you do not have HIV, but are at risk, it may be recommended that you take a prescription medicine daily to prevent HIV infection. This is called pre-exposure prophylaxis (PrEP). You are considered at risk if: ? You are sexually active and do not regularly use condoms or know the HIV status of your partner(s). ? You take drugs by injection. ? You are sexually active with a partner who has HIV.  Talk with your health care provider about whether you are at high risk of being infected with HIV. If you choose to begin PrEP, you should first be tested for HIV. You should then be tested every 3 months for as long as you are taking PrEP. Pregnancy  If you are premenopausal and you may become pregnant, ask your health  care provider about preconception counseling.  If you may become pregnant, take 400 to 800 micrograms (mcg) of folic acid every day.  If you want to prevent pregnancy, talk to your health care provider about birth control (contraception). Osteoporosis and menopause  Osteoporosis is a disease in which the bones lose minerals and strength with aging. This can result in serious bone fractures. Your risk for osteoporosis can be identified using a bone density scan.  If you are 56 years of age or older, or if you are at risk for osteoporosis and fractures, ask your health care provider if you should be screened.  Ask your health care  provider whether you should take a calcium or vitamin D supplement to lower your risk for osteoporosis.  Menopause may have certain physical symptoms and risks.  Hormone replacement therapy may reduce some of these symptoms and risks. Talk to your health care provider about whether hormone replacement therapy is right for you. Follow these instructions at home:  Schedule regular health, dental, and eye exams.  Stay current with your immunizations.  Do not use any tobacco products including cigarettes, chewing tobacco, or electronic cigarettes.  If you are pregnant, do not drink alcohol.  If you are breastfeeding, limit how much and how often you drink alcohol.  Limit alcohol intake to no more than 1 drink per day for nonpregnant women. One drink equals 12 ounces of beer, 5 ounces of wine, or 1 ounces of hard liquor.  Do not use street drugs.  Do not share needles.  Ask your health care provider for help if you need support or information about quitting drugs.  Tell your health care provider if you often feel depressed.  Tell your health care provider if you have ever been abused or do not feel safe at home. This information is not intended to replace advice given to you by your health care provider. Make sure you discuss any questions you have with  your health care provider. Document Released: 11/07/2010 Document Revised: 09/30/2015 Document Reviewed: 01/26/2015 Elsevier Interactive Patient Education  2018 [employer]. Janice Bodine M.D.

## 2017-06-21 ENCOUNTER — Encounter: Payer: Self-pay | Admitting: Internal Medicine

## 2017-06-21 ENCOUNTER — Ambulatory Visit (INDEPENDENT_AMBULATORY_CARE_PROVIDER_SITE_OTHER): Payer: Medicare HMO | Admitting: Internal Medicine

## 2017-06-21 VITALS — BP 128/70 | HR 84 | Temp 98.5°F | Ht 62.5 in | Wt 150.9 lb

## 2017-06-21 DIAGNOSIS — Z0000 Encounter for general adult medical examination without abnormal findings: Secondary | ICD-10-CM

## 2017-06-21 DIAGNOSIS — Z79899 Other long term (current) drug therapy: Secondary | ICD-10-CM

## 2017-06-21 DIAGNOSIS — M545 Low back pain: Secondary | ICD-10-CM | POA: Diagnosis not present

## 2017-06-21 DIAGNOSIS — R7301 Impaired fasting glucose: Secondary | ICD-10-CM

## 2017-06-21 DIAGNOSIS — E785 Hyperlipidemia, unspecified: Secondary | ICD-10-CM

## 2017-06-21 DIAGNOSIS — G8929 Other chronic pain: Secondary | ICD-10-CM

## 2017-06-21 DIAGNOSIS — I10 Essential (primary) hypertension: Secondary | ICD-10-CM

## 2017-06-21 DIAGNOSIS — M549 Dorsalgia, unspecified: Secondary | ICD-10-CM

## 2017-06-21 LAB — LIPID PANEL
CHOL/HDL RATIO: 3
Cholesterol: 173 mg/dL (ref 0–200)
HDL: 56.7 mg/dL (ref >39.00)
LDL CALC: 79 mg/dL (ref 0–99)
NonHDL: 116.09
TRIGLYCERIDES: 185 mg/dL — AB (ref 0.0–149.0)
VLDL: 37 mg/dL (ref 0.0–40.0)

## 2017-06-21 LAB — CBC WITH DIFFERENTIAL/PLATELET
BASOS ABS: 0 10*3/uL (ref 0.0–0.1)
BASOS PCT: 0.5 % (ref 0.0–3.0)
Eosinophils Absolute: 0.3 10*3/uL (ref 0.0–0.7)
Eosinophils Relative: 4.2 % (ref 0.0–5.0)
HEMATOCRIT: 42.8 % (ref 36.0–46.0)
HEMOGLOBIN: 14.4 g/dL (ref 12.0–15.0)
LYMPHS PCT: 27.3 % (ref 12.0–46.0)
Lymphs Abs: 2.1 10*3/uL (ref 0.7–4.0)
MCHC: 33.6 g/dL (ref 30.0–36.0)
MCV: 88.1 fl (ref 78.0–100.0)
MONOS PCT: 8.7 % (ref 3.0–12.0)
Monocytes Absolute: 0.7 10*3/uL (ref 0.1–1.0)
NEUTROS ABS: 4.6 10*3/uL (ref 1.4–7.7)
Neutrophils Relative %: 59.3 % (ref 43.0–77.0)
PLATELETS: 297 10*3/uL (ref 150.0–400.0)
RBC: 4.86 Mil/uL (ref 3.87–5.11)
RDW: 14.3 % (ref 11.5–15.5)
WBC: 7.7 10*3/uL (ref 4.0–10.5)

## 2017-06-21 LAB — HEPATIC FUNCTION PANEL
ALT: 26 U/L (ref 0–35)
AST: 24 U/L (ref 0–37)
Albumin: 4.7 g/dL (ref 3.5–5.2)
Alkaline Phosphatase: 78 U/L (ref 39–117)
BILIRUBIN DIRECT: 0.1 mg/dL (ref 0.0–0.3)
BILIRUBIN TOTAL: 0.4 mg/dL (ref 0.2–1.2)
TOTAL PROTEIN: 7.2 g/dL (ref 6.0–8.3)

## 2017-06-21 LAB — HEMOGLOBIN A1C: Hgb A1c MFr Bld: 6.1 % (ref 4.6–6.5)

## 2017-06-21 LAB — BASIC METABOLIC PANEL
BUN: 19 mg/dL (ref 6–23)
CHLORIDE: 101 meq/L (ref 96–112)
CO2: 31 meq/L (ref 19–32)
Calcium: 9.6 mg/dL (ref 8.4–10.5)
Creatinine, Ser: 0.84 mg/dL (ref 0.40–1.20)
GFR: 71.76 mL/min (ref >60.00)
Glucose, Bld: 100 mg/dL — ABNORMAL HIGH (ref 70–99)
Potassium: 4.3 mEq/L (ref 3.5–5.1)
SODIUM: 139 meq/L (ref 135–145)

## 2017-06-21 LAB — TSH: TSH: 5.95 u[IU]/mL — AB (ref 0.35–4.50)

## 2017-06-21 NOTE — Patient Instructions (Signed)
Glad you are doing well .   Will notify you  of labs when available. Continue lifestyle intervention healthy eating and exercise .    Yearly flu vaccine  In future  shingrix new shingles vaccine.   Yearly check up and labs  If all ok    Health Maintenance, Female Adopting a healthy lifestyle and getting preventive care can go a long way to promote health and wellness. Talk with your health care provider about what schedule of regular examinations is right for you. This is a good chance for you to check in with your provider about disease prevention and staying healthy. In between checkups, there are plenty of things you can do on your own. Experts have done a lot of research about which lifestyle changes and preventive measures are most likely to keep you healthy. Ask your health care provider for more information. Weight and diet Eat a healthy diet  Be sure to include plenty of vegetables, fruits, low-fat dairy products, and lean protein.  Do not eat a lot of foods high in solid fats, added sugars, or salt.  Get regular exercise. This is one of the most important things you can do for your health. ? Most adults should exercise for at least 150 minutes each week. The exercise should increase your heart rate and make you sweat (moderate-intensity exercise). ? Most adults should also do strengthening exercises at least twice a week. This is in addition to the moderate-intensity exercise.  Maintain a healthy weight  Body mass index (BMI) is a measurement that can be used to identify possible weight problems. It estimates body fat based on height and weight. Your health care provider can help determine your BMI and help you achieve or maintain a healthy weight.  For females 42 years of age and older: ? A BMI below 18.5 is considered underweight. ? A BMI of 18.5 to 24.9 is normal. ? A BMI of 25 to 29.9 is considered overweight. ? A BMI of 30 and above is considered obese.  Watch levels  of cholesterol and blood lipids  You should start having your blood tested for lipids and cholesterol at 68 years of age, then have this test every 5 years.  You may need to have your cholesterol levels checked more often if: ? Your lipid or cholesterol levels are high. ? You are older than 68 years of age. ? You are at high risk for heart disease.  Cancer screening Lung Cancer  Lung cancer screening is recommended for adults 46-26 years old who are at high risk for lung cancer because of a history of smoking.  A yearly low-dose CT scan of the lungs is recommended for people who: ? Currently smoke. ? Have quit within the past 15 years. ? Have at least a 30-pack-year history of smoking. A pack year is smoking an average of one pack of cigarettes a day for 1 year.  Yearly screening should continue until it has been 15 years since you quit.  Yearly screening should stop if you develop a health problem that would prevent you from having lung cancer treatment.  Breast Cancer  Practice breast self-awareness. This means understanding how your breasts normally appear and feel.  It also means doing regular breast self-exams. Let your health care provider know about any changes, no matter how small.  If you are in your 20s or 30s, you should have a clinical breast exam (CBE) by a health care provider every 1-3 years as part  of a regular health exam.  If you are 71 or older, have a CBE every year. Also consider having a breast X-ray (mammogram) every year.  If you have a family history of breast cancer, talk to your health care provider about genetic screening.  If you are at high risk for breast cancer, talk to your health care provider about having an MRI and a mammogram every year.  Breast cancer gene (BRCA) assessment is recommended for women who have family members with BRCA-related cancers. BRCA-related cancers include: ? Breast. ? Ovarian. ? Tubal. ? Peritoneal  cancers.  Results of the assessment will determine the need for genetic counseling and BRCA1 and BRCA2 testing.  Cervical Cancer Your health care provider may recommend that you be screened regularly for cancer of the pelvic organs (ovaries, uterus, and vagina). This screening involves a pelvic examination, including checking for microscopic changes to the surface of your cervix (Pap test). You may be encouraged to have this screening done every 3 years, beginning at age 6.  For women ages 102-65, health care providers may recommend pelvic exams and Pap testing every 3 years, or they may recommend the Pap and pelvic exam, combined with testing for human papilloma virus (HPV), every 5 years. Some types of HPV increase your risk of cervical cancer. Testing for HPV may also be done on women of any age with unclear Pap test results.  Other health care providers may not recommend any screening for nonpregnant women who are considered low risk for pelvic cancer and who do not have symptoms. Ask your health care provider if a screening pelvic exam is right for you.  If you have had past treatment for cervical cancer or a condition that could lead to cancer, you need Pap tests and screening for cancer for at least 20 years after your treatment. If Pap tests have been discontinued, your risk factors (such as having a new sexual partner) need to be reassessed to determine if screening should resume. Some women have medical problems that increase the chance of getting cervical cancer. In these cases, your health care provider may recommend more frequent screening and Pap tests.  Colorectal Cancer  This type of cancer can be detected and often prevented.  Routine colorectal cancer screening usually begins at 68 years of age and continues through 68 years of age.  Your health care provider may recommend screening at an earlier age if you have risk factors for colon cancer.  Your health care provider may also  recommend using home test kits to check for hidden blood in the stool.  A small camera at the end of a tube can be used to examine your colon directly (sigmoidoscopy or colonoscopy). This is done to check for the earliest forms of colorectal cancer.  Routine screening usually begins at age 62.  Direct examination of the colon should be repeated every 5-10 years through 68 years of age. However, you may need to be screened more often if early forms of precancerous polyps or small growths are found.  Skin Cancer  Check your skin from head to toe regularly.  Tell your health care provider about any new moles or changes in moles, especially if there is a change in a mole's shape or color.  Also tell your health care provider if you have a mole that is larger than the size of a pencil eraser.  Always use sunscreen. Apply sunscreen liberally and repeatedly throughout the day.  Protect yourself by wearing long  sleeves, pants, a wide-brimmed hat, and sunglasses whenever you are outside.  Heart disease, diabetes, and high blood pressure  High blood pressure causes heart disease and increases the risk of stroke. High blood pressure is more likely to develop in: ? People who have blood pressure in the high end of the normal range (130-139/85-89 mm Hg). ? People who are overweight or obese. ? People who are African American.  If you are 13-18 years of age, have your blood pressure checked every 3-5 years. If you are 83 years of age or older, have your blood pressure checked every year. You should have your blood pressure measured twice-once when you are at a hospital or clinic, and once when you are not at a hospital or clinic. Record the average of the two measurements. To check your blood pressure when you are not at a hospital or clinic, you can use: ? An automated blood pressure machine at a pharmacy. ? A home blood pressure monitor.  If you are between 63 years and 64 years old, ask your  health care provider if you should take aspirin to prevent strokes.  Have regular diabetes screenings. This involves taking a blood sample to check your fasting blood sugar level. ? If you are at a normal weight and have a low risk for diabetes, have this test once every three years after 68 years of age. ? If you are overweight and have a high risk for diabetes, consider being tested at a younger age or more often. Preventing infection Hepatitis B  If you have a higher risk for hepatitis B, you should be screened for this virus. You are considered at high risk for hepatitis B if: ? You were born in a country where hepatitis B is common. Ask your health care provider which countries are considered high risk. ? Your parents were born in a high-risk country, and you have not been immunized against hepatitis B (hepatitis B vaccine). ? You have HIV or AIDS. ? You use needles to inject street drugs. ? You live with someone who has hepatitis B. ? You have had sex with someone who has hepatitis B. ? You get hemodialysis treatment. ? You take certain medicines for conditions, including cancer, organ transplantation, and autoimmune conditions.  Hepatitis C  Blood testing is recommended for: ? Everyone born from [birth year] through 1966. ? Anyone with known risk factors for hepatitis C.  Sexually transmitted infections (STIs)  You should be screened for sexually transmitted infections (STIs) including gonorrhea and chlamydia if: ? You are sexually active and are younger than 68 years of age. ? You are older than 68 years of age and your health care provider tells you that you are at risk for this type of infection. ? Your sexual activity has changed since you were last screened and you are at an increased risk for chlamydia or gonorrhea. Ask your health care provider if you are at risk.  If you do not have HIV, but are at risk, it may be recommended that you take a prescription medicine daily to  prevent HIV infection. This is called pre-exposure prophylaxis (PrEP). You are considered at risk if: ? You are sexually active and do not regularly use condoms or know the HIV status of your partner(s). ? You take drugs by injection. ? You are sexually active with a partner who has HIV.  Talk with your health care provider about whether you are at high risk of being infected with HIV. If  you choose to begin PrEP, you should first be tested for HIV. You should then be tested every 3 months for as long as you are taking PrEP. Pregnancy  If you are premenopausal and you may become pregnant, ask your health care provider about preconception counseling.  If you may become pregnant, take 400 to 800 micrograms (mcg) of folic acid every day.  If you want to prevent pregnancy, talk to your health care provider about birth control (contraception). Osteoporosis and menopause  Osteoporosis is a disease in which the bones lose minerals and strength with aging. This can result in serious bone fractures. Your risk for osteoporosis can be identified using a bone density scan.  If you are 19 years of age or older, or if you are at risk for osteoporosis and fractures, ask your health care provider if you should be screened.  Ask your health care provider whether you should take a calcium or vitamin D supplement to lower your risk for osteoporosis.  Menopause may have certain physical symptoms and risks.  Hormone replacement therapy may reduce some of these symptoms and risks. Talk to your health care provider about whether hormone replacement therapy is right for you. Follow these instructions at home:  Schedule regular health, dental, and eye exams.  Stay current with your immunizations.  Do not use any tobacco products including cigarettes, chewing tobacco, or electronic cigarettes.  If you are pregnant, do not drink alcohol.  If you are breastfeeding, limit how much and how often you drink  alcohol.  Limit alcohol intake to no more than 1 drink per day for nonpregnant women. One drink equals 12 ounces of beer, 5 ounces of wine, or 1 ounces of hard liquor.  Do not use street drugs.  Do not share needles.  Ask your health care provider for help if you need support or information about quitting drugs.  Tell your health care provider if you often feel depressed.  Tell your health care provider if you have ever been abused or do not feel safe at home. This information is not intended to replace advice given to you by your health care provider. Make sure you discuss any questions you have with your health care provider. Document Released: 11/07/2010 Document Revised: 09/30/2015 Document Reviewed: 01/26/2015 Elsevier Interactive Patient Education  [employer].

## 2017-06-27 DIAGNOSIS — M545 Low back pain: Secondary | ICD-10-CM | POA: Diagnosis not present

## 2017-07-03 ENCOUNTER — Other Ambulatory Visit: Payer: Self-pay | Admitting: Internal Medicine

## 2017-07-03 DIAGNOSIS — R7989 Other specified abnormal findings of blood chemistry: Secondary | ICD-10-CM

## 2017-07-12 DIAGNOSIS — M545 Low back pain: Secondary | ICD-10-CM | POA: Diagnosis not present

## 2017-07-16 DIAGNOSIS — Z1231 Encounter for screening mammogram for malignant neoplasm of breast: Secondary | ICD-10-CM | POA: Diagnosis not present

## 2017-07-16 DIAGNOSIS — Z803 Family history of malignant neoplasm of breast: Secondary | ICD-10-CM | POA: Diagnosis not present

## 2017-07-18 DIAGNOSIS — M545 Low back pain: Secondary | ICD-10-CM | POA: Diagnosis not present

## 2017-07-25 DIAGNOSIS — M545 Low back pain: Secondary | ICD-10-CM | POA: Diagnosis not present

## 2017-07-30 ENCOUNTER — Other Ambulatory Visit (INDEPENDENT_AMBULATORY_CARE_PROVIDER_SITE_OTHER): Payer: Medicare HMO

## 2017-07-30 DIAGNOSIS — R7989 Other specified abnormal findings of blood chemistry: Secondary | ICD-10-CM

## 2017-07-30 LAB — T4, FREE: FREE T4: 0.83 ng/dL (ref 0.60–1.60)

## 2017-07-30 LAB — TSH: TSH: 2.95 u[IU]/mL (ref 0.35–4.50)

## 2017-07-31 LAB — THYROID ANTIBODIES
THYROID PEROXIDASE ANTIBODY: 25 [IU]/mL — AB (ref <9)
Thyroglobulin Ab: <1 IU/mL (ref <=1)

## 2017-08-03 DIAGNOSIS — M4316 Spondylolisthesis, lumbar region: Secondary | ICD-10-CM | POA: Diagnosis not present

## 2017-08-28 DIAGNOSIS — M48061 Spinal stenosis, lumbar region without neurogenic claudication: Secondary | ICD-10-CM | POA: Diagnosis not present

## 2017-08-28 DIAGNOSIS — M545 Low back pain: Secondary | ICD-10-CM | POA: Diagnosis not present

## 2017-09-11 DIAGNOSIS — M545 Low back pain: Secondary | ICD-10-CM | POA: Diagnosis not present

## 2017-09-23 ENCOUNTER — Encounter: Payer: Self-pay | Admitting: Internal Medicine

## 2017-09-24 NOTE — Telephone Encounter (Signed)
Medication added to [place] Message sent to Dr Regis Bill as Juluis Rainier and for verification that she will take over prescribing this medication for future refills.   Please advise Dr Regis Bill, thanks.

## 2017-09-26 MED ORDER — MELOXICAM 7.5 MG PO TABS: 7.5000 mg | ORAL_TABLET | Freq: Every day | ORAL | 1 refills | Status: DC

## 2017-09-26 NOTE — Telephone Encounter (Signed)
See message  I sent in  Med

## 2017-09-27 ENCOUNTER — Other Ambulatory Visit: Payer: Self-pay | Admitting: Internal Medicine

## 2017-09-27 DIAGNOSIS — H524 Presbyopia: Secondary | ICD-10-CM | POA: Diagnosis not present

## 2017-09-30 ENCOUNTER — Encounter: Payer: Self-pay | Admitting: Internal Medicine

## 2017-10-02 NOTE — Progress Notes (Deleted)
No chief complaint on file.   HPI: Jacqueline Myers 67 y.o. come in for Chronic disease management  ROS: See pertinent positives and negatives per HPI.  Past Medical History:  Diagnosis Date  . Colon polyps    hyperplastic  . Diverticulosis   . Esophageal stricture   . GERD (gastroesophageal reflux disease)   . History of bronchitis   . Hyperlipidemia   . Hypertension   . Mitral valve prolapse   . Osteoarthritis   . Osteoporosis   . Spinal stenosis     Family History  Problem Relation Age of Onset  . Breast cancer Sister   . Alcohol abuse Mother   . Stroke Mother   . Hypertension Mother   . Diabetes Mother   . Hyperlipidemia Mother   . Hyperlipidemia Father   . Colon cancer Neg Hx     Social History   Socioeconomic History  . Marital status: Married    Spouse name: Not on file  . Number of children: 2  . Years of education: Not on file  . Highest education level: Not on file  Occupational History  . Occupation: Retired  Scientific laboratory technician  . Financial resource strain: Not on file  . Food insecurity:    Worry: Not on file    Inability: Not on file  . Transportation needs:    Medical: Not on file    Non-medical: Not on file  Tobacco Use  . Smoking status: Former Smoker    Packs/day: 1.00    Years: 29.00    Pack years: 29.00    Types: Cigarettes    Last attempt to quit: 05/09/1999    Years since quitting: 18.4  . Smokeless tobacco: Never Used  Substance and Sexual Activity  . Alcohol use: Yes    Alcohol/week: 0.0 oz    Comment: OCC- [place] WINE  . Drug use: No  . Sexual activity: Not on file  Lifestyle  . Physical activity:    Days per week: Not on file    Minutes per session: Not on file  . Stress: Not on file  Relationships  . Social connections:    Talks on phone: Not on file    Gets together: Not on file    Attends religious service: Not on file    Active member of club or organization: Not on file    Attends meetings of clubs or  organizations: Not on file    Relationship status: Not on file  Other Topics Concern  . Not on file  Social History Narrative   Married   [place] helps take care 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   Neg td neg ets    Outpatient Medications Prior to Visit  Medication Sig Dispense Refill  . acetaminophen (TYLENOL) 500 MG tablet Take 500-1,000 mg by mouth every 6 (six) hours as needed for moderate pain.    . Ascorbic Acid (VITAMIN C) 1000 MG tablet Take 1,000 mg by mouth daily.    Marland Kitchen aspirin EC 81 MG tablet Take 81 mg by mouth daily.    . Candesartan Cilexetil-HCTZ 32-25 MG TABS TAKE 1 TABLET BY MOUTH ONCE DAILY 90 each 3  . citalopram (CELEXA) 10 MG tablet TAKE 1 & 1/2 (ONE & ONE-HALF) TABLETS BY MOUTH ONCE DAILY 135 tablet 0  . cyclobenzaprine (FLEXERIL) 10  MG tablet TAKE ONE-HALF TO ONE TABLET BY MOUTH THREE TIMES DAILY AS NEEDED FOR MUSCLE SPASM 30 tablet 0  . diclofenac sodium (VOLTAREN) 1 % GEL Apply 4 g topically 4 (four) times daily. 100 g 2  . DOCOSAHEXAENOIC ACID PO Take 1 g by mouth.    Marland Kitchen LORazepam (ATIVAN) 1 MG tablet Take 0.5-1 tablets (0.5-1 mg total) by mouth every 8 (eight) hours as needed for anxiety. 15 tablet 0  . meloxicam (MOBIC) 7.5 MG tablet Take 7.5 mg by mouth daily.    . meloxicam (MOBIC) 7.5 MG tablet Take 1 tablet (7.5 mg total) by mouth daily. If needed for back pain 30 tablet 1  . Multiple Vitamins-Minerals (PRESERVISION AREDS PO) Take 1 capsule by mouth daily.    . Omega-3 Fatty Acids (FISH OIL) 1200 MG CPDR Take 2 capsules by mouth daily.    . rosuvastatin (CRESTOR) 5 MG tablet TAKE 1 TABLET BY MOUTH DAILY 90 tablet 3   No facility-administered medications prior to visit.      EXAM:  There were no vitals taken for this visit.  There is no height or weight on file to calculate BMI.  GENERAL: vitals reviewed and listed above,  alert, oriented, appears well hydrated and in no acute distress HEENT: atraumatic, conjunctiva  clear, no obvious abnormalities on inspection of external nose and ears OP : no lesion edema or exudate  NECK: no obvious masses on inspection palpation  LUNGS: clear to auscultation bilaterally, no wheezes, rales or rhonchi, good air movement CV: HRRR, no clubbing cyanosis or  peripheral edema nl cap refill  MS: moves all extremities without noticeable focal  abnormality PSYCH: pleasant and cooperative, no obvious depression or anxiety Lab Results  Component Value Date   WBC 7.7 06/21/2017   HGB 14.4 06/21/2017   HCT 42.8 06/21/2017   PLT 297.0 06/21/2017   GLUCOSE 100 (H) 06/21/2017   CHOL 173 06/21/2017   TRIG 185.0 (H) 06/21/2017   HDL 56.70 06/21/2017   LDLDIRECT 127.6 06/23/2013   LDLCALC 79 06/21/2017   ALT 26 06/21/2017   AST 24 06/21/2017   NA 139 06/21/2017   K 4.3 06/21/2017   CL 101 06/21/2017   CREATININE 0.84 06/21/2017   BUN 19 06/21/2017   CO2 31 06/21/2017   TSH 2.95 07/30/2017   INR 0.93 04/08/2015   HGBA1C 6.1 06/21/2017   BP Readings from Last 3 Encounters:  06/21/17 128/70  03/07/17 122/70  02/26/17 128/68    ASSESSMENT AND PLAN:  Discussed the following assessment and plan:  No diagnosis found.  -Patient advised to return or notify health care team  if  new concerns arise.  There are no Patient Instructions on file for this visit.   Standley Brooking. Panosh M.D.

## 2017-10-04 ENCOUNTER — Ambulatory Visit: Payer: Medicare HMO | Admitting: Internal Medicine

## 2017-11-11 ENCOUNTER — Other Ambulatory Visit: Payer: Self-pay | Admitting: Internal Medicine

## 2017-11-23 IMAGING — DX DG LUMBAR SPINE BEND(FLEX/EXT) ONLY 2-3 V
2 series · 2 of 2 positions shown · non-contrast
Comparison: Three-view lumbar spine same day.

CLINICAL DATA: Chronic low back pain radiating to both lower
extremities with left foot tingling.

EXAM:
LUMBAR SPINE FLEX AND EXTEND ONLY - 2-3 VIEW

[l-spine flex]
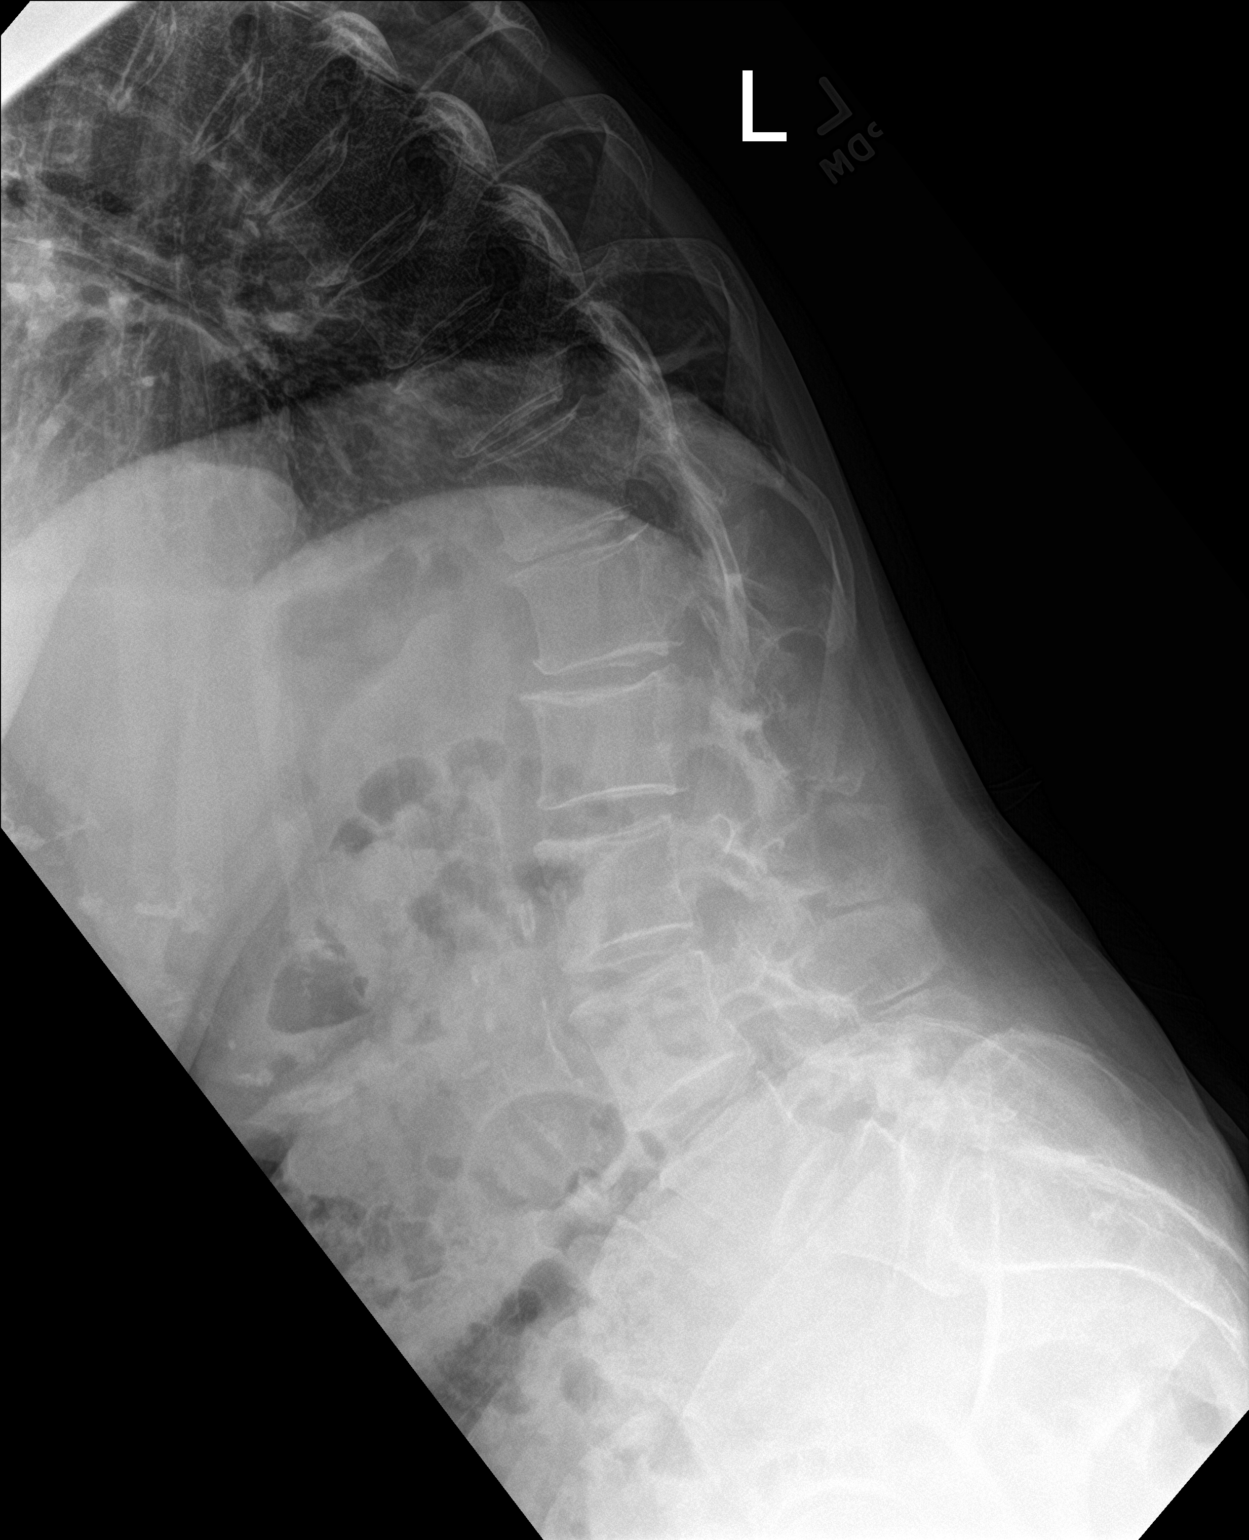

[l-spine ext]
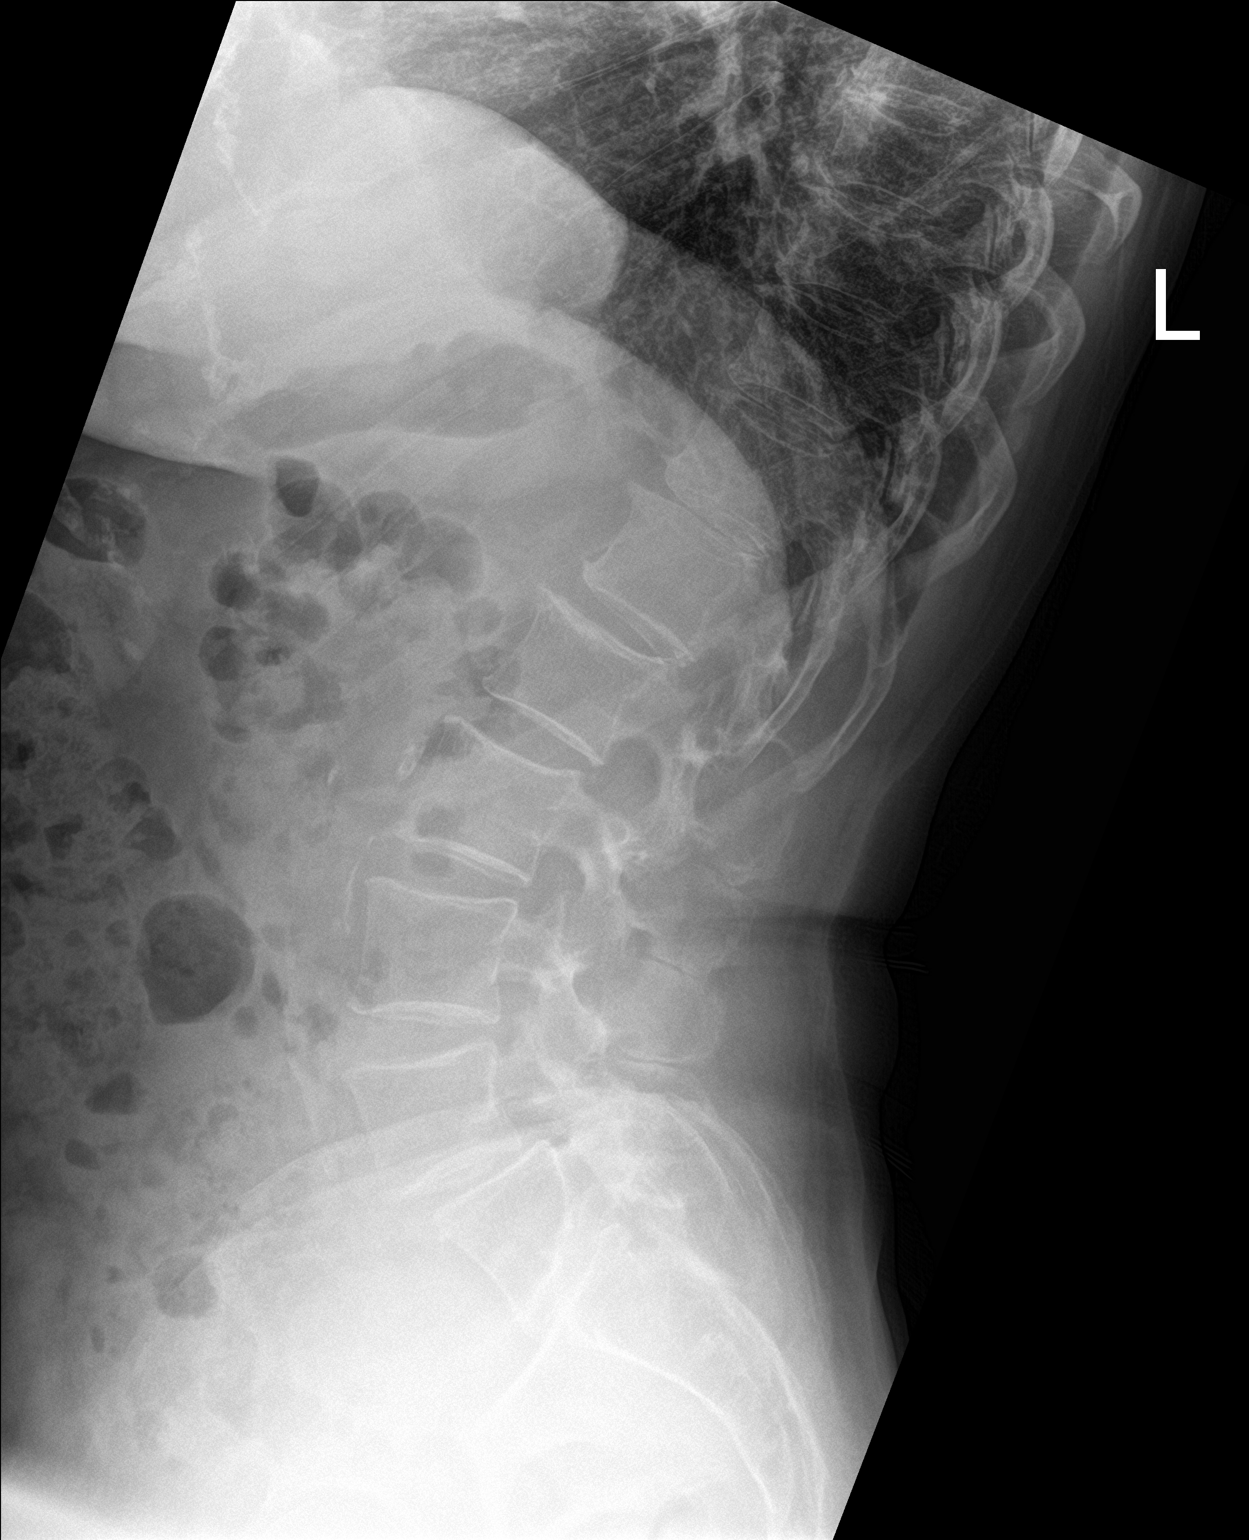

[2 of 2 positions shown; findings below may reference images not displayed]

FINDINGS: Vertebral body heights are within normal. There is mild spondylosis
throughout the lumbar spine to include facet arthropathy over the
lower lumbar spine. There is a grade 1 anterolisthesis of L4 on L5
likely due to the moderate facet arthropathy. Mild disc space
narrowing at the L4-5 level no compression fracture. No instability
upon flexion and extension.
IMPRESSION: Mild spondylosis of the lumbar spine with disc disease at the L4-5
level. Grade 1 anterolisthesis of L4 on L5 without instability upon
flexion and extension.

Aortic atherosclerosis.

## 2017-11-28 DIAGNOSIS — R69 Illness, unspecified: Secondary | ICD-10-CM | POA: Diagnosis not present

## 2017-12-28 ENCOUNTER — Other Ambulatory Visit: Payer: Self-pay | Admitting: Internal Medicine

## 2018-01-02 ENCOUNTER — Ambulatory Visit (INDEPENDENT_AMBULATORY_CARE_PROVIDER_SITE_OTHER): Payer: Medicare HMO | Admitting: Internal Medicine

## 2018-01-02 ENCOUNTER — Encounter: Payer: Self-pay | Admitting: Internal Medicine

## 2018-01-02 ENCOUNTER — Other Ambulatory Visit (HOSPITAL_COMMUNITY)
Admission: RE | Admit: 2018-01-02 | Discharge: 2018-01-02 | Disposition: A | Discharge status: Home / Self Care | Payer: Medicare HMO | Source: Ambulatory Visit | Attending: Internal Medicine | Admitting: Internal Medicine

## 2018-01-02 VITALS — BP 126/67 | HR 85 | Temp 98.9°F | Wt 140.0 lb

## 2018-01-02 DIAGNOSIS — N898 Other specified noninflammatory disorders of vagina: Secondary | ICD-10-CM | POA: Diagnosis not present

## 2018-01-02 LAB — POC URINALSYSI DIPSTICK (AUTOMATED)
Bilirubin, UA: NEGATIVE
Glucose, UA: NEGATIVE
Ketones, UA: NEGATIVE
Leukocytes, UA: NEGATIVE
Nitrite, UA: NEGATIVE
PH UA: 6 (ref 5.0–8.0)
PROTEIN UA: NEGATIVE
RBC UA: NEGATIVE
SPEC GRAV UA: 1.015 (ref 1.010–1.025)
Urobilinogen, UA: 0.2 E.U./dL

## 2018-01-02 MED ORDER — HYDROCORTISONE 2.5 % EX CREA: TOPICAL_CREAM | Freq: Two times a day (BID) | CUTANEOUS | 0 refills | Status: DC

## 2018-01-02 MED ORDER — TERCONAZOLE 0.4 % VA CREA: 1.0000 | TOPICAL_CREAM | Freq: Every day | VAGINAL | 0 refills | Status: DC

## 2018-01-02 NOTE — Patient Instructions (Addendum)
  Checking for yeast   Partly treated?  Exam is reassuring and just  Irritated   Sending in  Different  Yeast medication  And also want you to use 2 % hydrocortisone on outside  hcs on the outside twice a day .   Stay  Dry as you are doing   Let us know if not better by next week or so .

## 2018-01-02 NOTE — Progress Notes (Signed)
Chief Complaint  Patient presents with  . Vaginal Itching    HPI: Jacqueline Myers 67 y.o. come in for sda some tiching off and on fro  Months  Worse  In past week.   Self rx with  Monistat for 3 days.  Cream and no help no reasl dc or dysuria  . 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   No other sx works at night   ROS: See pertinent positives and negatives per HPI.  Past Medical History:  Diagnosis Date  . Colon polyps    hyperplastic  . Diverticulosis   . Esophageal stricture   . GERD (gastroesophageal reflux disease)   . History of bronchitis   . Hyperlipidemia   . Hypertension   . Mitral valve prolapse   . Osteoarthritis   . Osteoporosis   . Spinal stenosis     Family History  Problem Relation Age of Onset  . Breast cancer Sister   . Alcohol abuse Mother   . Stroke Mother   . Hypertension Mother   . Diabetes Mother   . Hyperlipidemia Mother   . Hyperlipidemia Father   . Colon cancer Neg Hx     Social History   Socioeconomic History  . Marital status: Married    Spouse name: Not on file  . Number of children: 2  . Years of education: Not on file  . Highest education level: Not on file  Occupational History  . Occupation: Retired  Scientific laboratory technician  . Financial resource strain: Not on file  . Food insecurity:    Worry: Not on file    Inability: Not on file  . Transportation needs:    Medical: Not on file    Non-medical: Not on file  Tobacco Use  . Smoking status: Former Smoker    Packs/day: 1.00    Years: 29.00    Pack years: 29.00    Types: Cigarettes    Last attempt to quit: 05/09/1999    Years since quitting: 18.6  . Smokeless tobacco: Never Used  Substance and Sexual Activity  . Alcohol use: Yes    Alcohol/week: 0.0 standard drinks    Comment: OCC- GLASS OF WINE  . Drug use: No  . Sexual activity: Not on file  Lifestyle  . Physical activity:   Days per week: Not on file    Minutes per session: Not on file  . Stress: Not on file  Relationships  . Social connections:    Talks on phone: Not on file    Gets together: Not on file    Attends religious service: Not on file    Active member of club or organization: Not on file    Attends meetings of clubs or organizations: Not on file    Relationship status: Not on file  Other Topics Concern  . Not on file  Social History Narrative   Married   [place] of 2 helps take care 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   Neg td neg ets    Outpatient Medications Prior to Visit  Medication Sig Dispense Refill  . acetaminophen (TYLENOL) 500 MG tablet Take 500-1,000 mg by mouth every 6 (six) hours as needed for moderate pain.    . Candesartan Cilexetil-HCTZ  32-25 MG TABS TAKE 1 TABLET BY MOUTH ONCE DAILY 90 each 3  . citalopram (CELEXA) 10 MG tablet TAKE 1 & 1/2 (ONE & ONE-HALF) TABLETS BY MOUTH ONCE DAILY 135 tablet 0  . cyclobenzaprine (FLEXERIL) 10 MG tablet TAKE 1/2 TO 1 (ONE-HALF TO ONE) TABLET BY MOUTH THREE TIMES DAILY AS NEEDED FOR MUSCLE SPASM 30 tablet 0  . diclofenac sodium (VOLTAREN) 1 % GEL Apply 4 g topically 4 (four) times daily. 100 g 2  . DOCOSAHEXAENOIC ACID PO Take 1 g by mouth.    Marland Kitchen LORazepam (ATIVAN) 1 MG tablet Take 0.5-1 tablets (0.5-1 mg total) by mouth every 8 (eight) hours as needed for anxiety. 15 tablet 0  . meloxicam (MOBIC) 7.5 MG tablet Take 7.5 mg by mouth daily.    . Omega-3 Fatty Acids (FISH OIL) 1200 MG CPDR Take 2 capsules by mouth daily.    . rosuvastatin (CRESTOR) 5 MG tablet TAKE 1 TABLET BY MOUTH DAILY 90 tablet 3  . TURMERIC PO Take 1,000 mg by mouth.    . Ascorbic Acid (VITAMIN C) 1000 MG tablet Take 1,000 mg by mouth daily.    Marland Kitchen aspirin EC 81 MG tablet Take 81 mg by mouth daily.    . meloxicam (MOBIC) 7.5 MG tablet Take 1 tablet  (7.5 mg total) by mouth daily. If needed for back pain (Patient not taking: Reported on 01/02/2018) 30 tablet 1  . Multiple Vitamins-Minerals (PRESERVISION AREDS PO) Take 1 capsule by mouth daily.     No facility-administered medications prior to visit.      EXAM:  BP 126/67   Pulse 85   Temp 98.9 F (37.2 C)   Wt 140 lb (63.5 kg)   BMI 25.20 kg/m   Body mass index is 25.2 kg/m.  GENERAL: vitals reviewed and listed above, alert, oriented, appears well hydrated and in no acute distress HEENT: atraumatic, conjunctiva  clear, no obvious abnormalities on inspection of external nose and ears   Ext GU  irritated  Labia bilaterally but no rash or plaque   Vag ok swab sample taken  For wet prep     No adenopathjy vesicle etc    PSYCH: pleasant and cooperative, no obvious depression or anxiety Lab Results  Component Value Date   WBC 7.7 06/21/2017   HGB 14.4 06/21/2017   HCT 42.8 06/21/2017   PLT 297.0 06/21/2017   GLUCOSE 100 (H) 06/21/2017   CHOL 173 06/21/2017   TRIG 185.0 (H) 06/21/2017   HDL 56.70 06/21/2017   LDLDIRECT 127.6 06/23/2013   LDLCALC 79 06/21/2017   ALT 26 06/21/2017   AST 24 06/21/2017   NA 139 06/21/2017   K 4.3 06/21/2017   CL 101 06/21/2017   CREATININE 0.84 06/21/2017   BUN 19 06/21/2017   CO2 31 06/21/2017   TSH 2.95 07/30/2017   INR 0.93 04/08/2015   HGBA1C 6.1 06/21/2017   BP Readings from Last 3 Encounters:  01/02/18 126/67  06/21/17 128/70  03/07/17 122/70   poct ua normal  ASSESSMENT AND PLAN:  Discussed the following assessment and plan:  Vaginal itching - Plan: Cervicovaginal ancillary only Empiric rx  resistant yeast and topical steroid  And care   Limiting  Sweating irritiative conditions etc   Fu if  persistent or progressive    Aside ok to try tumeric for joint pain  -Patient advised to return or notify health care team  if  new concerns arise.  Patient Instructions   Checking for yeast  Partly treated?  Exam is reassuring  and just  Irritated   Sending in  Different  Yeast medication  And also want you to use 2 % hydrocortisone on outside  hcs on the outside twice a day .   Stay  Dry as you are doing   Let us know if not better by next week or so .       Standley Brooking. Jeilyn Reznik M.D.

## 2018-01-02 NOTE — Addendum Note (Signed)
Addended by: Aggie Hacker A on: 01/02/2018 01:57 PM   Modules accepted: Orders

## 2018-01-04 LAB — CERVICOVAGINAL ANCILLARY ONLY
BACTERIAL VAGINITIS: NEGATIVE
Candida vaginitis: NEGATIVE
Trichomonas: NEGATIVE

## 2018-01-22 ENCOUNTER — Encounter: Payer: Self-pay | Admitting: Internal Medicine

## 2018-01-22 ENCOUNTER — Ambulatory Visit (INDEPENDENT_AMBULATORY_CARE_PROVIDER_SITE_OTHER): Payer: Medicare HMO | Admitting: Internal Medicine

## 2018-01-22 DIAGNOSIS — N898 Other specified noninflammatory disorders of vagina: Secondary | ICD-10-CM | POA: Diagnosis not present

## 2018-01-22 DIAGNOSIS — R7301 Impaired fasting glucose: Secondary | ICD-10-CM

## 2018-01-22 DIAGNOSIS — R21 Rash and other nonspecific skin eruption: Secondary | ICD-10-CM

## 2018-01-22 LAB — HEMOGLOBIN A1C: Hgb A1c MFr Bld: 6.1 % (ref 4.6–6.5)

## 2018-01-22 NOTE — Progress Notes (Signed)
Chief Complaint  Patient presents with  . Vaginal Itching    Pt states that vaginal itching is about 90% improved but is now having rectal itching.     HPI: Jacqueline Myers 67 y.o. come in forfu  For vaginal  itching ithcing see last visit in  August 128    Now jsut at rectal area   And no dc    Also  gets recurrent  When hot  ithcy rash on back side and buttocks area  Off and on for years  Last about a week and can occur every few months .  No fever  husb no itching sx   Past Medical History:  Diagnosis Date  . Colon polyps    hyperplastic  . Diverticulosis   . Esophageal stricture   . GERD (gastroesophageal reflux disease)   . History of bronchitis   . Hyperlipidemia   . Hypertension   . Mitral valve prolapse   . Osteoarthritis   . Osteoporosis   . Spinal stenosis     Family History  Problem Relation Age of Onset  . Breast cancer Sister   . Alcohol abuse Mother   . Stroke Mother   . Hypertension Mother   . Diabetes Mother   . Hyperlipidemia Mother   . Hyperlipidemia Father   . Colon cancer Neg Hx     Social History   Socioeconomic History  . Marital status: Married    Spouse name: Not on file  . Number of children: 2  . Years of education: Not on file  . Highest education level: Not on file  Occupational History  . Occupation: Retired  Scientific laboratory technician  . Financial resource strain: Not on file  . Food insecurity:    Worry: Not on file    Inability: Not on file  . Transportation needs:    Medical: Not on file    Non-medical: Not on file  Tobacco Use  . Smoking status: Former Smoker    Packs/day: 1.00    Years: 29.00    Pack years: 29.00    Types: Cigarettes    Last attempt to quit: 05/09/1999    Years since quitting: 18.7  . Smokeless tobacco: Never Used  Substance and Sexual Activity  . Alcohol use: Yes    Alcohol/week: 0.0 standard drinks    Comment: OCC- GL[place] WINE  . Drug use: No  . Sexual activity: Not on file  Lifestyle  .  Physical activity:    Days per week: Not on file    Minutes per session: Not on file  . Stress: Not on file  Relationships  . Social connections:    Talks on phone: Not on file    Gets together: Not on file    Attends religious service: Not on file    Active member of club or organization: Not on file    Attends meetings of clubs or organizations: Not on file    Relationship status: Not on file  Other Topics Concern  . Not on file  Social History Narrative   Married   [place] of 2 helps take care 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   Neg td neg ets    Outpatient Medications Prior to Visit  Medication Sig Dispense Refill  . acetaminophen (TYLENOL) 500 MG tablet Take 500-1,000 mg by mouth every  6 (six) hours as needed for moderate pain.    . Candesartan Cilexetil-HCTZ 32-25 MG TABS TAKE 1 TABLET BY MOUTH ONCE DAILY 90 each 3  . citalopram (CELEXA) 10 MG tablet TAKE 1 & 1/2 (ONE & ONE-HALF) TABLETS BY MOUTH ONCE DAILY 135 tablet 0  . cyclobenzaprine (FLEXERIL) 10 MG tablet TAKE 1/2 TO 1 (ONE-HALF TO ONE) TABLET BY MOUTH THREE TIMES DAILY AS NEEDED FOR MUSCLE SPASM 30 tablet 0  . diclofenac sodium (VOLTAREN) 1 % GEL Apply 4 g topically 4 (four) times daily. 100 g 2  . DOCOSAHEXAENOIC ACID PO Take 1 g by mouth.    . hydrocortisone 2.5 % cream Apply topically 2 (two) times daily. As needed for itching 30 g 0  . LORazepam (ATIVAN) 1 MG tablet Take 0.5-1 tablets (0.5-1 mg total) by mouth every 8 (eight) hours as needed for anxiety. 15 tablet 0  . meloxicam (MOBIC) 7.5 MG tablet Take 7.5 mg by mouth daily.    . Omega-3 Fatty Acids (FISH OIL) 1200 MG CPDR Take 2 capsules by mouth daily.    . rosuvastatin (CRESTOR) 5 MG tablet TAKE 1 TABLET BY MOUTH DAILY 90 tablet 3  . TURMERIC PO Take 1,000 mg by mouth.    . terconazole (TERAZOL 7) 0.4 % vaginal cream Place 1 applicator  vaginally at bedtime. (Patient not taking: Reported on 01/22/2018) 45 g 0   No facility-administered medications prior to visit.      EXAM:  There were no vitals taken for this visit.  There is no height or weight on file to calculate BMI.  GENERAL: vitals reviewed and listed above, alert, oriented, appears well hydrated and in no acute distress HEENT: atraumatic, conjunctiva  clear, no obvious abnormalities on inspection of external nose and earsext gu less irritation no lesion  periranal area slight redness    PSYCH: pleasant and cooperative, no obvious depression or anxiety Skin   Faded crop of patches red right buttocks and  Mid line .  No cursitng   BP Readings from Last 3 Encounters:  01/02/18 126/67  06/21/17 128/70  03/07/17 122/70    ASSESSMENT AND PLAN:  Discussed the following assessment and plan:  Itching in the vaginal area - improved  rx anal area   nod evidence of  poss of pinworms at this time - Plan: HSV(herpes simplex vrs) 1+2 ab-IgG  Rash - recurrent says in same places  last 2 weeks and itchy  uncertgain cause  faded today check hsv poss and return when recurs  - Plan: HSV(herpes simplex vrs) 1+2 ab-IgG  Fasting hyperglycemia - Plan: Hemoglobin A1c  -Patient advised to return or notify health care team  if  new concerns arise.  Patient Instructions  Can use the hydrocortisone at the rectal  area  Twice a day   Immunity testing for herpes simplex virus   Plan come in at next time get rash   There are medications that an prevent or treat if used early ( valtrex) .  Will notify you  of labs when available.  Let us know if not getting better in next 1-2 weeks     Mariann Laster K. Antara Brecheisen M.D.

## 2018-01-22 NOTE — Patient Instructions (Addendum)
Can use the hydrocortisone at the rectal  area  Twice a day   Immunity testing for herpes simplex virus   Plan come in at next time get rash   There are medications that an prevent or treat if used early ( valtrex) .  Will notify you  of labs when available.  Let us know if not getting better in next 1-2 weeks

## 2018-01-23 LAB — HSV(HERPES SIMPLEX VRS) I + II AB-IGG: HSV 2 IGG,TYPE SPECIFIC AB: 17.3 index — ABNORMAL HIGH

## 2018-01-24 ENCOUNTER — Ambulatory Visit: Payer: Medicare HMO | Admitting: Internal Medicine

## 2018-01-24 DIAGNOSIS — Z23 Encounter for immunization: Secondary | ICD-10-CM

## 2018-01-25 ENCOUNTER — Encounter: Payer: Self-pay | Admitting: *Deleted

## 2018-01-25 ENCOUNTER — Other Ambulatory Visit: Payer: Self-pay | Admitting: Internal Medicine

## 2018-01-25 MED ORDER — VALACYCLOVIR HCL 1 G PO TABS: 1000.0000 mg | ORAL_TABLET | Freq: Three times a day (TID) | ORAL | 3 refills | Status: DC

## 2018-01-25 NOTE — Progress Notes (Signed)
See result note.  

## 2018-02-27 DIAGNOSIS — R69 Illness, unspecified: Secondary | ICD-10-CM | POA: Diagnosis not present

## 2018-03-12 ENCOUNTER — Other Ambulatory Visit: Payer: Self-pay | Admitting: Internal Medicine

## 2018-03-14 NOTE — Telephone Encounter (Signed)
Please advise Dr Panosh, thanks.   

## 2018-03-29 ENCOUNTER — Other Ambulatory Visit: Payer: Self-pay | Admitting: Internal Medicine

## 2018-04-26 NOTE — Progress Notes (Signed)
No chief complaint on file.   HPI: Jacqueline Myers 67 y.o. come in for cough   x weeks  Like when  Blew leaves.  Ongoing   No illness with   this and feels pretty well  Wheezing about.  At times   Wet cough  ocass junk up no blood   Worse with hot working   30 years of  Tobacco and  Beginning quite ? 2001?  Last pfts  2016 mild copd not responsive  to bronchodilator  Has emphysema on  [place] on ct in past.  Has  GERD  Since  Some inc weight    And had been on omeprazole in past for a year  But weaned off when lost weight ROS: See pertinent positives and negatives per HPI.  Past Medical History:  Diagnosis Date  . Colon polyps    hyperplastic  . Diverticulosis   . Esophageal stricture   . GERD (gastroesophageal reflux disease)   . History of bronchitis   . Hyperlipidemia   . Hypertension   . Mitral valve prolapse   . Osteoarthritis   . Osteoporosis   . Spinal stenosis     Family History  Problem Relation Age of Onset  . Breast cancer Sister   . Alcohol abuse Mother   . Stroke Mother   . Hypertension Mother   . Diabetes Mother   . Hyperlipidemia Mother   . Hyperlipidemia Father   . Colon cancer Neg Hx     Social History   Socioeconomic History  . Marital status: Married    Spouse name: Not on file  . Number of children: 2  . Years of education: Not on file  . Highest education level: Not on file  Occupational History  . Occupation: Retired  Scientific laboratory technician  . Financial resource strain: Not on file  . Food insecurity:    Worry: Not on file    Inability: Not on file  . Transportation needs:    Medical: Not on file    Non-medical: Not on file  Tobacco Use  . Smoking status: Former Smoker    Packs/day: 1.00    Years: 29.00    Pack years: 29.00    Types: Cigarettes    Last attempt to quit: 05/09/1999    Years since quitting: 18.9  . Smokeless tobacco: Never Used  Substance and Sexual Activity  . Alcohol use: Yes    Alcohol/week: 0.0  standard drinks    Comment: OCC- GL[place] WINE  . Drug use: No  . Sexual activity: Not on file  Lifestyle  . Physical activity:    Days per week: Not on file    Minutes per session: Not on file  . Stress: Not on file  Relationships  . Social connections:    Talks on phone: Not on file    Gets together: Not on file    Attends religious service: Not on file    Active member of club or organization: Not on file    Attends meetings of clubs or organizations: Not on file    Relationship status: Not on file  Other Topics Concern  . Not on file  Social History Narrative   Married   [place] of 2 helps take care 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   Neg td neg ets  Outpatient Medications Prior to Visit  Medication Sig Dispense Refill  . acetaminophen (TYLENOL) 500 MG tablet Take 500-1,000 mg by mouth every 6 (six) hours as needed for moderate pain.    . Candesartan Cilexetil-HCTZ 32-25 MG TABS TAKE 1 TABLET BY MOUTH ONCE DAILY 90 each 3  . citalopram (CELEXA) 10 MG tablet TAKE 1 & 1/2 (ONE & ONE-HALF) TABLETS BY MOUTH ONCE DAILY 135 tablet 0  . cyclobenzaprine (FLEXERIL) 10 MG tablet TAKE 1/2 TO 1 (ONE-HALF TO ONE) TABLET BY MOUTH THREE TIMES DAILY AS NEEDED FOR MUSCLE SPASM 30 tablet 0  . diclofenac sodium (VOLTAREN) 1 % GEL Apply 4 g topically 4 (four) times daily. 100 g 2  . DOCOSAHEXAENOIC ACID PO Take 1 g by mouth.    . hydrocortisone 2.5 % cream Apply topically 2 (two) times daily. As needed for itching 30 g 0  . LORazepam (ATIVAN) 1 MG tablet Take 0.5-1 tablets (0.5-1 mg total) by mouth every 8 (eight) hours as needed for anxiety. 15 tablet 0  . meloxicam (MOBIC) 7.5 MG tablet Take 7.5 mg by mouth daily.    . Omega-3 Fatty Acids (FISH OIL) 1200 MG CPDR Take 2 capsules by mouth daily.    . rosuvastatin (CRESTOR) 5 MG tablet TAKE 1 TABLET BY MOUTH DAILY 90  tablet 3  . TURMERIC PO Take 1,000 mg by mouth.    . valACYclovir (VALTREX) 1000 MG tablet Take 1 tablet (1,000 mg total) by mouth 3 (three) times daily. As directed 30 tablet 3   No facility-administered medications prior to visit.      EXAM:  BP 132/66 (BP Location: Left Arm, Patient Position: Sitting, Cuff Size: Normal)   Pulse 84   Temp 98.4 F (36.9 C) (Oral)   Wt 145 lb (65.8 kg)   SpO2 95%   BMI 26.10 kg/m   Body mass index is 26.1 kg/m.  GENERAL: vitals reviewed and listed above, alert, oriented, appears well hydrated and in no acute distress ocass cough  HEENT: atraumatic, conjunctiva  clear, no obvious abnormalities on inspection of external nose and ears min congestion tm clear OP : no lesion edema or exudate  NECK: no obvious masses on inspection palpation  LUNGS: clear to auscultation bilaterally, no wheezes, rales or rhonchi, good air movement  Initial rare wheeze  Equal  air movement  CV: HRRR, no clubbing cyanosis or  peripheral edema nl cap refill  MS: moves all extremities without noticeable focal  abnormality PSYCH: pleasant and cooperative, no obvious depression or anxiety Lab Results  Component Value Date   WBC 7.7 06/21/2017   HGB 14.4 06/21/2017   HCT 42.8 06/21/2017   PLT 297.0 06/21/2017   GLUCOSE 100 (H) 06/21/2017   CHOL 173 06/21/2017   TRIG 185.0 (H) 06/21/2017   HDL 56.70 06/21/2017   LDLDIRECT 127.6 06/23/2013   LDLCALC 79 06/21/2017   ALT 26 06/21/2017   AST 24 06/21/2017   NA 139 06/21/2017   K 4.3 06/21/2017   CL 101 06/21/2017   CREATININE 0.84 06/21/2017   BUN 19 06/21/2017   CO2 31 06/21/2017   TSH 2.95 07/30/2017   INR 0.93 04/08/2015   HGBA1C 6.1 01/22/2018   BP Readings from Last 3 Encounters:  04/29/18 132/66  01/02/18 126/67  06/21/17 128/70   Wt Readings from Last 3 Encounters:  04/29/18 145 lb (65.8 kg)  01/02/18 140 lb (63.5 kg)  06/21/17 150 lb 14.4 oz (68.4 kg)    c xray see results nad  Chronic upper lung  changes  ASSESSMENT AND PLAN:  Discussed the following assessment and plan:  Cough, persistent - Plan: DG Chest 2 View  History of tobacco use - Plan: DG Chest 2 View  Gastroesophageal reflux disease, esophagitis presence not specified - nocturnal; sx rx with ppi in past hx stricture?  -Patient advised to return or notify health care team  if  new concerns arise.  Patient Instructions  Exam is reassuring   Cough can be from  Aggravated GERD.  Poss asthmatic or   Lung irritation  Mild copd .  Allergy etc.   X ray :  Reassuring .  Trial.   For  prilosec for  3-4 weeks . Every day    gerd precautions .    Cough, Adult  Coughing is a reflex that clears your throat and your airways. Coughing helps to heal and protect your lungs. It is normal to cough occasionally, but a cough that happens with other symptoms or lasts a long time may be a sign of a condition that needs treatment. A cough may last only 2-3 weeks (acute), or it may last longer than 8 weeks (chronic). What are the causes? Coughing is commonly caused by:  Breathing in substances that irritate your lungs.  A viral or bacterial respiratory infection.  Allergies.  Asthma.  Postnasal drip.  Smoking.  Acid backing up from the stomach into the esophagus (gastroesophageal reflux).  Certain medicines.  Chronic lung problems, including COPD (or rarely, lung cancer).  Other medical conditions such as heart failure. Follow these instructions at home: Pay attention to any changes in your symptoms. Take these actions to help with your discomfort:  Take medicines only as told by your health care provider. ? If you were prescribed an antibiotic medicine, take it as told by your health care provider. Do not stop taking the antibiotic even if you start to feel better. ? Talk with your health care provider before you take a cough suppressant medicine.  Drink enough fluid to keep your urine clear or pale yellow.  If the  air is dry, use a cold steam vaporizer or humidifier in your bedroom or your home to help loosen secretions.  Avoid anything that causes you to cough at work or at home.  If your cough is worse at night, try sleeping in a semi-upright position.  Avoid cigarette smoke. If you smoke, quit smoking. If you need help quitting, ask your health care provider.  Avoid caffeine.  Avoid alcohol.  Rest as needed. Contact a health care provider if:  You have new symptoms.  You cough up pus.  Your cough does not get better after 2-3 weeks, or your cough gets worse.  You cannot control your cough with suppressant medicines and you are losing sleep.  You develop pain that is getting worse or pain that is not controlled with pain medicines.  You have a fever.  You have unexplained weight loss.  You have night sweats. Get help right away if:  You cough up blood.  You have difficulty breathing.  Your heartbeat is very fast. This information is not intended to replace advice given to you by your health care provider. Make sure you discuss any questions you have with your health care provider. Document Released: 10/21/2010 Document Revised: 09/30/2015 Document Reviewed: 07/01/2014 Elsevier Interactive Patient Education  2019 [place] K.  M.D.

## 2018-04-29 ENCOUNTER — Ambulatory Visit (INDEPENDENT_AMBULATORY_CARE_PROVIDER_SITE_OTHER): Payer: Medicare HMO | Admitting: Internal Medicine

## 2018-04-29 ENCOUNTER — Encounter: Payer: Self-pay | Admitting: Internal Medicine

## 2018-04-29 ENCOUNTER — Ambulatory Visit (INDEPENDENT_AMBULATORY_CARE_PROVIDER_SITE_OTHER): Payer: Medicare HMO

## 2018-04-29 VITALS — BP 132/66 | HR 84 | Temp 98.4°F | Wt 145.0 lb

## 2018-04-29 DIAGNOSIS — Z87891 Personal history of nicotine dependence: Secondary | ICD-10-CM | POA: Diagnosis not present

## 2018-04-29 DIAGNOSIS — R05 Cough: Secondary | ICD-10-CM

## 2018-04-29 DIAGNOSIS — K219 Gastro-esophageal reflux disease without esophagitis: Secondary | ICD-10-CM

## 2018-04-29 DIAGNOSIS — R053 Chronic cough: Secondary | ICD-10-CM

## 2018-04-29 MED ORDER — OMEPRAZOLE 20 MG PO CPDR: 20.0000 mg | DELAYED_RELEASE_CAPSULE | Freq: Every day | ORAL | 1 refills | Status: DC

## 2018-04-29 NOTE — Patient Instructions (Addendum)
Exam is reassuring   Cough can be from  Aggravated GERD.  Poss asthmatic or   Lung irritation  Mild copd .  Allergy etc.   X ray :  Reassuring .  Trial.   For  prilosec for  3-4 weeks . Every day    gerd precautions .    Cough, Adult  Coughing is a reflex that clears your throat and your airways. Coughing helps to heal and protect your lungs. It is normal to cough occasionally, but a cough that happens with other symptoms or lasts a long time may be a sign of a condition that needs treatment. A cough may last only 2-3 weeks (acute), or it may last longer than 8 weeks (chronic). What are the causes? Coughing is commonly caused by:  Breathing in substances that irritate your lungs.  A viral or bacterial respiratory infection.  Allergies.  Asthma.  Postnasal drip.  Smoking.  Acid backing up from the stomach into the esophagus (gastroesophageal reflux).  Certain medicines.  Chronic lung problems, including COPD (or rarely, lung cancer).  Other medical conditions such as heart failure. Follow these instructions at home: Pay attention to any changes in your symptoms. Take these actions to help with your discomfort:  Take medicines only as told by your health care provider. ? If you were prescribed an antibiotic medicine, take it as told by your health care provider. Do not stop taking the antibiotic even if you start to feel better. ? Talk with your health care provider before you take a cough suppressant medicine.  Drink enough fluid to keep your urine clear or pale yellow.  If the air is dry, use a cold steam vaporizer or humidifier in your bedroom or your home to help loosen secretions.  Avoid anything that causes you to cough at work or at home.  If your cough is worse at night, try sleeping in a semi-upright position.  Avoid cigarette smoke. If you smoke, quit smoking. If you need help quitting, ask your health care provider.  Avoid caffeine.  Avoid  alcohol.  Rest as needed. Contact a health care provider if:  You have new symptoms.  You cough up pus.  Your cough does not get better after 2-3 weeks, or your cough gets worse.  You cannot control your cough with suppressant medicines and you are losing sleep.  You develop pain that is getting worse or pain that is not controlled with pain medicines.  You have a fever.  You have unexplained weight loss.  You have night sweats. Get help right away if:  You cough up blood.  You have difficulty breathing.  Your heartbeat is very fast. This information is not intended to replace advice given to you by your health care provider. Make sure you discuss any questions you have with your health care provider. Document Released: 10/21/2010 Document Revised: 09/30/2015 Document Reviewed: 07/01/2014 Elsevier Interactive Patient Education  2019 [employer].

## 2018-06-02 ENCOUNTER — Other Ambulatory Visit: Payer: Self-pay | Admitting: Internal Medicine

## 2018-06-03 DIAGNOSIS — R69 Illness, unspecified: Secondary | ICD-10-CM | POA: Diagnosis not present

## 2018-06-06 ENCOUNTER — Telehealth: Payer: Self-pay | Admitting: Internal Medicine

## 2018-06-06 NOTE — Telephone Encounter (Signed)
Copied from [place] [id number]. Topic: Quick Communication - See Telephone Encounter >> Jun 06, 2018  2:54 PM Rutherford Nail, NT wrote: CRM for notification. See Telephone encounter for: 06/06/18. Patient calling and states that the pharmacy states that the Candesartan Cilexetil-HCTZ 32-25 MG TABS combination is on back order at all the [place] pharmacies in the area. States that the pharmacy wants to know if Dr Regis Bill would be okay separating the prescription into more than 1 prescription? Please advise. Patient currently out of this medication

## 2018-06-07 NOTE — Telephone Encounter (Signed)
Called pharmacy and got the prescription separated. Pt has been informed

## 2018-06-07 NOTE — Telephone Encounter (Signed)
Ok to do this 

## 2018-06-17 ENCOUNTER — Other Ambulatory Visit: Payer: Self-pay | Admitting: Internal Medicine

## 2018-07-04 ENCOUNTER — Other Ambulatory Visit: Payer: Self-pay | Admitting: Internal Medicine

## 2018-07-22 ENCOUNTER — Encounter: Payer: Self-pay | Admitting: Internal Medicine

## 2018-07-22 DIAGNOSIS — Z1231 Encounter for screening mammogram for malignant neoplasm of breast: Secondary | ICD-10-CM | POA: Diagnosis not present

## 2018-07-22 DIAGNOSIS — Z803 Family history of malignant neoplasm of breast: Secondary | ICD-10-CM | POA: Diagnosis not present

## 2018-08-08 ENCOUNTER — Other Ambulatory Visit: Payer: Self-pay | Admitting: Internal Medicine

## 2018-09-09 NOTE — Telephone Encounter (Signed)
Suggest  Go off azo and plan Vido visit and get clean catch ua and culture

## 2018-09-10 ENCOUNTER — Other Ambulatory Visit: Payer: Self-pay

## 2018-09-10 ENCOUNTER — Encounter: Payer: Self-pay | Admitting: Internal Medicine

## 2018-09-10 ENCOUNTER — Other Ambulatory Visit: Payer: Self-pay | Admitting: Internal Medicine

## 2018-09-10 ENCOUNTER — Telehealth: Payer: Medicare HMO | Admitting: Physician Assistant

## 2018-09-10 ENCOUNTER — Ambulatory Visit (INDEPENDENT_AMBULATORY_CARE_PROVIDER_SITE_OTHER): Payer: Medicare HMO | Admitting: Internal Medicine

## 2018-09-10 DIAGNOSIS — R350 Frequency of micturition: Secondary | ICD-10-CM | POA: Diagnosis not present

## 2018-09-10 DIAGNOSIS — R7301 Impaired fasting glucose: Secondary | ICD-10-CM

## 2018-09-10 DIAGNOSIS — R3915 Urgency of urination: Secondary | ICD-10-CM

## 2018-09-10 DIAGNOSIS — R7989 Other specified abnormal findings of blood chemistry: Secondary | ICD-10-CM

## 2018-09-10 DIAGNOSIS — R300 Dysuria: Secondary | ICD-10-CM

## 2018-09-10 DIAGNOSIS — E785 Hyperlipidemia, unspecified: Secondary | ICD-10-CM

## 2018-09-10 DIAGNOSIS — I10 Essential (primary) hypertension: Secondary | ICD-10-CM

## 2018-09-10 DIAGNOSIS — Z79899 Other long term (current) drug therapy: Secondary | ICD-10-CM

## 2018-09-10 DIAGNOSIS — K219 Gastro-esophageal reflux disease without esophagitis: Secondary | ICD-10-CM

## 2018-09-10 LAB — POC URINALSYSI DIPSTICK (AUTOMATED)
Bilirubin, UA: NEGATIVE
Blood, UA: NEGATIVE
Glucose, UA: NEGATIVE
Ketones, UA: NEGATIVE
Leukocytes, UA: NEGATIVE
Nitrite, UA: NEGATIVE
Protein, UA: NEGATIVE
Spec Grav, UA: 1.015 (ref 1.010–1.025)
Urobilinogen, UA: 0.2 E.U./dL
pH, UA: 6.5 (ref 5.0–8.0)

## 2018-09-10 MED ORDER — CEPHALEXIN 500 MG PO CAPS: 500.0000 mg | ORAL_CAPSULE | Freq: Two times a day (BID) | ORAL | 0 refills | Status: AC

## 2018-09-10 NOTE — Progress Notes (Signed)
We are sorry that you are not feeling well.  Here is how we plan to help!  Based on what you shared with me it looks like you most likely have a simple urinary tract infection.  A UTI (Urinary Tract Infection) is a bacterial infection of the bladder.  Most cases of urinary tract infections are simple to treat but a key part of your care is to encourage you to drink plenty of fluids and watch your symptoms carefully.  I have prescribed Keflex 500 mg twice a day for 7 days.  Your symptoms should gradually improve. Call us if the burning in your urine worsens, you develop worsening fever, back pain or pelvic pain or if your symptoms do not resolve after completing the antibiotic.  Urinary tract infections can be prevented by drinking plenty of water to keep your body hydrated.  Also be sure when you wipe, wipe from front to back and don't hold it in!  If possible, empty your bladder every 4 hours.  Your e-visit answers were reviewed by a board certified advanced clinical practitioner to complete your personal care plan.  Depending on the condition, your plan could have included both over the counter or prescription medications.  If there is a problem please reply once you have received a response from your provider.  Your safety is important to [place].  If you have drug allergies check your prescription carefully.    You can use MyChart to ask questions about today's visit, request a non-urgent call back, or ask for a work or school excuse for 24 hours related to this e-Visit. If it has been greater than 24 hours you will need to follow up with your provider, or enter a new e-Visit to address those concerns.   You will get an e-mail in the next two days asking about your experience.  I hope that your e-visit has been valuable and will speed your recovery. Thank you for using e-visits.    ===View-only below this line===   ----- Message -----    From: Carleene Cooper    Sent: 09/10/2018   9:58 AM EDT      To: E-Visit Mailing List Subject: E-Visit Submission: Urinary Problems  E-Visit Submission: Urinary Problems --------------------------------  Question: Which of the following are you experiencing? Answer:   Difficulty passing urine  Question: Are you able to pass urine? Answer:   Yes, I can pass urine.  Question: How long have you had pain or difficulty passing urine? Answer:   More than one week  Question: Do you have a fever? Answer:   No, I do not have a fever  Question: Do you have any of the following? Answer:   I have back pain with this illness  Question: Do you have an exaggerated sensation of the need to pass urine? Answer:   Yes, the sensation is exaggerated  Question: Do you have the urge to urinate more of less frequently than normal? Answer:   More frequently  Question: What does your urine look like? Answer:   It is clear  Question: Do you have any of the following? Answer:   None of the above  Question: Do you have any of the following? Answer:   No discharge  Question: Do you have any sores on your genitals? Answer:   No  Question: Do you have any history of kidney dysfunction or kidney problems? Answer:   No  Question: Within the past 3 months, have you had any  surgery on your kidneys or bladder, or have you had a tube inserted to collect your urine? Answer:   No, I have never had either  Question: Have you had similar symptoms in the past? Answer:   Yes, I have had similar symptoms before  Question: If you had similar symptoms in the past, did any of the following work? Answer:   I do not remember  Question: Please list any additional comments  Answer:     Question: Please list your medication allergies that you may have ? (If 'none' , please list as 'none') Answer:   Simvastatin  A total of 5-10 minutes was spent evaluating this patients questionnaire and formulating a plan of care.

## 2018-09-10 NOTE — Progress Notes (Signed)
Virtual Visit via Video Note  I connected with@ on 09/10/18 at 10:45 AM EDT by a video enabled telemedicine application and verified that I am speaking with the correct person using two identifiers. Location patient: home Location provider:work  office Persons participating in the virtual visit: patient, provider  [place] national recommendations  regarding COVID 19 pandemic   video visit is advised over in office visit for this patient.  Discussed the limitations of evaluation and management by telemedicine and  availability of in person appointments. The patient expressed understanding and agreed to proceed.   HPI: Jacqueline Myers presents for VV because of 1 week fo urgent urination with frequency   Without fever hematuria or abdominal pain .   Had similar in apst but went away.  Took azo with help but make her feel bad so stopped . And now sx are back . No flank pain  abd pain vomiting  Change in diet  Excess carbonation caffiene etoh. And no new meds   ROS: See pertinent positives and negatives per HPI.  Past Medical History:  Diagnosis Date  . Colon polyps    hyperplastic  . Diverticulosis   . Esophageal stricture   . GERD (gastroesophageal reflux disease)   . History of bronchitis   . Hyperlipidemia   . Hypertension   . Mitral valve prolapse   . Osteoarthritis   . Osteoporosis   . Spinal stenosis     Past Surgical History:  Procedure Laterality Date  . COLONOSCOPY    . NM MYOVIEW LTD  08/2006   normal for abn ekg  . TONSILLECTOMY    . TOTAL KNEE ARTHROPLASTY Left 04/14/2015   Procedure: LEFT TOTAL KNEE ARTHROPLASTY;  Surgeon: Gaynelle Arabian, MD;  Location: [facility];  Service: Orthopedics;  Laterality: Left;    Family History  Problem Relation Age of Onset  . Breast cancer Sister   . Alcohol abuse Mother   . Stroke Mother   . Hypertension Mother   . Diabetes Mother   . Hyperlipidemia Mother   . Hyperlipidemia Father   . Colon cancer Neg Hx     Social  History   Tobacco Use  . Smoking status: Former Smoker    Packs/day: 1.00    Years: 29.00    Pack years: 29.00    Types: Cigarettes    Last attempt to quit: 05/09/1999    Years since quitting: 19.3  . Smokeless tobacco: Never Used  Substance Use Topics  . Alcohol use: Yes    Alcohol/week: 0.0 standard drinks    Comment: OCC- GLASS OF WINE  . Drug use: No      Current Outpatient Medications:  .  acetaminophen (TYLENOL) 500 MG tablet, Take 500-1,000 mg by mouth every 6 (six) hours as needed for moderate pain., Disp: , Rfl:  .  Candesartan Cilexetil-HCTZ 32-25 MG TABS, TAKE 1 TABLET BY MOUTH ONCE DAILY, Disp: 90 each, Rfl: 0 .  cephALEXin (KEFLEX) 500 MG capsule, Take 1 capsule (500 mg total) by mouth 2 (two) times daily for 7 days., Disp: 14 capsule, Rfl: 0 .  citalopram (CELEXA) 10 MG tablet, TAKE 1 & 1/2 (ONE & ONE-HALF) TABLETS BY MOUTH ONCE DAILY, Disp: 135 tablet, Rfl: 0 .  cyclobenzaprine (FLEXERIL) 10 MG tablet, TAKE 1/2 TO 1 (ONE-HALF TO ONE) TABLET BY MOUTH THREE TIMES DAILY AS NEEDED FOR MUSCLE SPASM, Disp: 30 tablet, Rfl: 0 .  diclofenac sodium (VOLTAREN) 1 % GEL, Apply 4 g topically 4 (four) times daily., Disp:  100 g, Rfl: 2 .  DOCOSAHEXAENOIC ACID PO, Take 1 g by mouth., Disp: , Rfl:  .  hydrocortisone 2.5 % cream, Apply topically 2 (two) times daily. As needed for itching, Disp: 30 g, Rfl: 0 .  LORazepam (ATIVAN) 1 MG tablet, Take 0.5-1 tablets (0.5-1 mg total) by mouth every 8 (eight) hours as needed for anxiety., Disp: 15 tablet, Rfl: 0 .  meloxicam (MOBIC) 7.5 MG tablet, Take 7.5 mg by mouth daily., Disp: , Rfl:  .  Omega-3 Fatty Acids (FISH OIL) 1200 MG CPDR, Take 2 capsules by mouth daily., Disp: , Rfl:  .  omeprazole (PRILOSEC) 20 MG capsule, Take 1 capsule by mouth once daily, Disp: 30 capsule, Rfl: 0 .  rosuvastatin (CRESTOR) 5 MG tablet, TAKE 1 TABLET BY MOUTH ONCE DAILY, Disp: 90 tablet, Rfl: 0 .  TURMERIC PO, Take 1,000 mg by mouth., Disp: , Rfl:  .   valACYclovir (VALTREX) 1000 MG tablet, Take 1 tablet (1,000 mg total) by mouth 3 (three) times daily. As directed, Disp: 30 tablet, Rfl: 3  EXAM: BP Readings from Last 3 Encounters:  04/29/18 132/66  01/02/18 126/67  06/21/17 128/70    VITALS per patient if applicable: looks well   GENERAL: alert, oriented, appears well and in no acute distress  HEENT: atraumatic, conjunttiva clear, no obvious abnormalities on inspection of external nose and ears NECK: normal movements of the head and neck LUNGS: on inspection no signs of respiratory distress, breathing rate appears normal, no obvious gross SOB, gasping or wheezing CV: no obvious cyanosi PSYCH/NEURO: pleasant and cooperative, no obvious depression or anxiety, speech and thought processing grossly intact Lab Results  Component Value Date   WBC 7.7 06/21/2017   HGB 14.4 06/21/2017   HCT 42.8 06/21/2017   PLT 297.0 06/21/2017   GLUCOSE 100 (H) 06/21/2017   CHOL 173 06/21/2017   TRIG 185.0 (H) 06/21/2017   HDL 56.70 06/21/2017   LDLDIRECT 127.6 06/23/2013   LDLCALC 79 06/21/2017   ALT 26 06/21/2017   AST 24 06/21/2017   NA 139 06/21/2017   K 4.3 06/21/2017   CL 101 06/21/2017   CREATININE 0.84 06/21/2017   BUN 19 06/21/2017   CO2 31 06/21/2017   TSH 2.95 07/30/2017   INR 0.93 04/08/2015   HGBA1C 6.1 01/22/2018    ASSESSMENT AND PLAN:  Discussed the following assessment and plan:  Urinary frequency - Plan: POCT Urinalysis Dipstick (Automated), Urine Culture  Urinary urgency  Counseled.  Diff dx  Get urine testing  And plan fu   culture if needed   Chart review shows due for monitoring labs and cpx  Also  Had e visit? Less than an hour ago   In record and not aware    Expectant management and discussion of plan and treatment with patient with opportunity to ask questions and all were answered. The patient agreed with the plan and demonstrated an understanding of the instructions.   The patient was advised to call  back or seek an in-person evaluation if worsening  or having concerns .  Shanon Ace, MD

## 2018-09-12 LAB — URINE CULTURE
MICRO NUMBER:: 446421
SPECIMEN QUALITY:: ADEQUATE

## 2018-09-16 ENCOUNTER — Other Ambulatory Visit (INDEPENDENT_AMBULATORY_CARE_PROVIDER_SITE_OTHER): Payer: Medicare HMO

## 2018-09-16 ENCOUNTER — Other Ambulatory Visit: Payer: Self-pay

## 2018-09-16 DIAGNOSIS — R7301 Impaired fasting glucose: Secondary | ICD-10-CM | POA: Diagnosis not present

## 2018-09-16 DIAGNOSIS — Z79899 Other long term (current) drug therapy: Secondary | ICD-10-CM

## 2018-09-16 DIAGNOSIS — I10 Essential (primary) hypertension: Secondary | ICD-10-CM | POA: Diagnosis not present

## 2018-09-16 DIAGNOSIS — E785 Hyperlipidemia, unspecified: Secondary | ICD-10-CM

## 2018-09-16 DIAGNOSIS — R7989 Other specified abnormal findings of blood chemistry: Secondary | ICD-10-CM | POA: Diagnosis not present

## 2018-09-16 DIAGNOSIS — K219 Gastro-esophageal reflux disease without esophagitis: Secondary | ICD-10-CM

## 2018-09-16 LAB — LIPID PANEL
Cholesterol: 167 mg/dL (ref 0–200)
HDL: 51.7 mg/dL (ref >39.00)
LDL Cholesterol: 76 mg/dL (ref 0–99)
NonHDL: 115.36
Total CHOL/HDL Ratio: 3
Triglycerides: 197 mg/dL — ABNORMAL HIGH (ref 0.0–149.0)
VLDL: 39.4 mg/dL (ref 0.0–40.0)

## 2018-09-16 LAB — HEPATIC FUNCTION PANEL
ALT: 17 U/L (ref 0–35)
AST: 19 U/L (ref 0–37)
Albumin: 4.5 g/dL (ref 3.5–5.2)
Alkaline Phosphatase: 83 U/L (ref 39–117)
Bilirubin, Direct: 0.1 mg/dL (ref 0.0–0.3)
Total Bilirubin: 0.4 mg/dL (ref 0.2–1.2)
Total Protein: 6.9 g/dL (ref 6.0–8.3)

## 2018-09-16 LAB — BASIC METABOLIC PANEL
BUN: 21 mg/dL (ref 6–23)
CO2: 30 mEq/L (ref 19–32)
Calcium: 9.1 mg/dL (ref 8.4–10.5)
Chloride: 101 mEq/L (ref 96–112)
Creatinine, Ser: 0.83 mg/dL (ref 0.40–1.20)
GFR: 68.2 mL/min (ref >60.00)
Glucose, Bld: 96 mg/dL (ref 70–99)
Potassium: 4.5 mEq/L (ref 3.5–5.1)
Sodium: 139 mEq/L (ref 135–145)

## 2018-09-16 LAB — CBC WITH DIFFERENTIAL/PLATELET
Basophils Absolute: 0 10*3/uL (ref 0.0–0.1)
Basophils Relative: 0.4 % (ref 0.0–3.0)
Eosinophils Absolute: 0.2 10*3/uL (ref 0.0–0.7)
Eosinophils Relative: 3.1 % (ref 0.0–5.0)
HCT: 40.6 % (ref 36.0–46.0)
Hemoglobin: 13.7 g/dL (ref 12.0–15.0)
Lymphocytes Relative: 29 % (ref 12.0–46.0)
Lymphs Abs: 2.2 10*3/uL (ref 0.7–4.0)
MCHC: 33.7 g/dL (ref 30.0–36.0)
MCV: 88.9 fl (ref 78.0–100.0)
Monocytes Absolute: 0.6 10*3/uL (ref 0.1–1.0)
Monocytes Relative: 8.3 % (ref 3.0–12.0)
Neutro Abs: 4.5 10*3/uL (ref 1.4–7.7)
Neutrophils Relative %: 59.2 % (ref 43.0–77.0)
Platelets: 258 10*3/uL (ref 150.0–400.0)
RBC: 4.56 Mil/uL (ref 3.87–5.11)
RDW: 14.2 % (ref 11.5–15.5)
WBC: 7.6 10*3/uL (ref 4.0–10.5)

## 2018-09-16 LAB — HEMOGLOBIN A1C: Hgb A1c MFr Bld: 6.2 % (ref 4.6–6.5)

## 2018-09-16 LAB — TSH: TSH: 6.39 u[IU]/mL — ABNORMAL HIGH (ref 0.35–4.50)

## 2018-09-18 ENCOUNTER — Other Ambulatory Visit: Payer: Self-pay | Admitting: Internal Medicine

## 2018-09-18 NOTE — Telephone Encounter (Signed)
Sent in electronically .  

## 2018-09-19 ENCOUNTER — Other Ambulatory Visit: Payer: Self-pay | Admitting: Internal Medicine

## 2018-09-19 DIAGNOSIS — I10 Essential (primary) hypertension: Secondary | ICD-10-CM

## 2018-09-19 DIAGNOSIS — R7989 Other specified abnormal findings of blood chemistry: Secondary | ICD-10-CM

## 2018-09-25 ENCOUNTER — Telehealth: Payer: Self-pay | Admitting: *Deleted

## 2018-09-25 ENCOUNTER — Encounter: Payer: Self-pay | Admitting: Internal Medicine

## 2018-09-25 ENCOUNTER — Ambulatory Visit: Payer: Self-pay | Admitting: Internal Medicine

## 2018-09-25 ENCOUNTER — Other Ambulatory Visit: Payer: Medicare HMO

## 2018-09-25 ENCOUNTER — Ambulatory Visit (INDEPENDENT_AMBULATORY_CARE_PROVIDER_SITE_OTHER): Payer: Medicare HMO | Admitting: Internal Medicine

## 2018-09-25 VITALS — Temp 100.6°F

## 2018-09-25 DIAGNOSIS — R52 Pain, unspecified: Secondary | ICD-10-CM | POA: Diagnosis not present

## 2018-09-25 DIAGNOSIS — R509 Fever, unspecified: Secondary | ICD-10-CM

## 2018-09-25 DIAGNOSIS — Z20822 Contact with and (suspected) exposure to covid-19: Secondary | ICD-10-CM

## 2018-09-25 DIAGNOSIS — R6889 Other general symptoms and signs: Secondary | ICD-10-CM | POA: Diagnosis not present

## 2018-09-25 NOTE — Telephone Encounter (Signed)
Noted  

## 2018-09-25 NOTE — Telephone Encounter (Signed)
Will send to Dr Panosh as FYI 

## 2018-09-25 NOTE — Telephone Encounter (Signed)
Appointment made for today

## 2018-09-25 NOTE — Telephone Encounter (Signed)
Pt called in c/o having a fever and body aches this morning.   She felt bad yesterday but did not check temperature.   See triage notes.  I let her know someone from the office would call her after the office opens at 8:00 to schedule a virtual visit due to the COVID-19 pandemic.   She was agreeable to this.  I forwarded these notes to Dr. Velora Mediate office for further disposition.    Reason for Disposition . [1] Fever > 101 F (38.3 C) AND [2] age > 69    Fever 100.6 this morning with body aches.   Felt bad yesterday but did not check temperature.  Answer Assessment - Initial Assessment Questions 1. TEMPERATURE: "What is the most recent temperature?"  "How was it measured?"      100.6 this morning  oraly 2. ONSET: "When did the fever start?"      I felt bad yesterday.   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 3. SYMPTOMS: "Do you have any other symptoms besides the fever?"  (e.g., colds, headache, sore throat, earache, cough, rash, diarrhea, vomiting, abdominal pain)     Body aches.  No other changes 4. CAUSE: If there are no symptoms, ask: "What do you think is causing the fever?"      I don't know.   I hope I don't have coronavirus. 5. CONTACTS: "Does anyone else in the family have an infection?"     No   And no contacts with anyone positive for the COVID-19. 6. TREATMENT: "What have you done so far to treat this fever?" (e.g., medications)     Nothing 7. IMMUNOCOMPROMISE: "Do you have of the following: diabetes, HIV positive, splenectomy, cancer chemotherapy, chronic steroid treatment, transplant patient, etc."     A little emphysema.  8. PREGNANCY: "Is there any chance you are pregnant?" "When was your last menstrual period?"     Not asked due to age 68. TRAVEL: "Have you traveled out of the country in the last month?" (e.g., travel history, exposures)     No travels or exposures to illness.  Protocols used: FEVER-A-AH

## 2018-09-25 NOTE — Progress Notes (Signed)
Virtual Visit via Video Note  I connected with@ on 09/25/18 at 10:00 AM EDT by a video enabled telemedicine application and verified that I am speaking with the correct person using two identifiers. Location patient: home Location provider:work office Persons participating in the virtual visit: patient, provider  [place] national recommendations  regarding COVID 19 pandemic   video visit is advised over in office visit for this patient.  Patient aware  of the limitations of evaluation and management by telemedicine and  availability of in person appointments. and agreed to proceed.   HPI: Jacqueline Myers presents for video visitpresents for sda  Because of a few days of body aches and today documented fever or 100.7   Achy tired all over   And now has a stuffy nose but no cough or new gi sx .  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   Still with ujrinary frequency at times but no dysuria   new luts sx   Has some outside exposures  No defined tick bite but some bite in  recent past and no rashes    Husband without sx .  ROS: See pertinent positives and negatives per HPI. Some headache but no sinus pain   Past Medical History:  Diagnosis Date  . Colon polyps    hyperplastic  . Diverticulosis   . Esophageal stricture   . GERD (gastroesophageal reflux disease)   . History of bronchitis   . Hyperlipidemia   . Hypertension   . Mitral valve prolapse   . Osteoarthritis   . Osteoporosis   . Spinal stenosis     Past Surgical History:  Procedure Laterality Date  . COLONOSCOPY    . NM MYOVIEW LTD  08/2006   normal for abn ekg  . TONSILLECTOMY    . TOTAL KNEE ARTHROPLASTY Left 04/14/2015   Procedure: LEFT TOTAL KNEE ARTHROPLASTY;  Surgeon: Gaynelle Arabian, MD;  Location: [facility];  Service: Orthopedics;  Laterality: Left;    Family History  Problem Relation Age of Onset  . Breast  cancer Sister   . Alcohol abuse Mother   . Stroke Mother   . Hypertension Mother   . Diabetes Mother   . Hyperlipidemia Mother   . Hyperlipidemia Father   . Colon cancer Neg Hx     Social History   Tobacco Use  . Smoking status: Former Smoker    Packs/day: 1.00    Years: 29.00    Pack years: 29.00    Types: Cigarettes    Last attempt to quit: 05/09/1999    Years since quitting: 19.3  . Smokeless tobacco: Never Used  Substance Use Topics  . Alcohol use: Yes    Alcohol/week: 0.0 standard drinks    Comment: OCC- GLASS OF WINE  . Drug use: No      Current Outpatient Medications:  .  acetaminophen (TYLENOL) 500 MG tablet, Take 500-1,000 mg by mouth every 6 (six) hours as needed for moderate pain., Disp: , Rfl:  .  Candesartan Cilexetil-HCTZ 32-25 MG TABS, TAKE 1 TABLET BY MOUTH ONCE DAILY, Disp: 90 each, Rfl: 0 .  citalopram (CELEXA) 10 MG tablet, TAKE 1 & 1/2 (ONE & ONE-HALF) TABLETS BY MOUTH ONCE DAILY, Disp: 135 tablet, Rfl: 2 .  cyclobenzaprine (FLEXERIL) 10 MG tablet, TAKE 1/2 TO 1 (ONE-HALF TO ONE) TABLET BY MOUTH THREE TIMES DAILY AS  NEEDED FOR MUSCLE SPASM, Disp: 30 tablet, Rfl: 0 .  diclofenac sodium (VOLTAREN) 1 % GEL, Apply 4 g topically 4 (four) times daily., Disp: 100 g, Rfl: 2 .  DOCOSAHEXAENOIC ACID PO, Take 1 g by mouth., Disp: , Rfl:  .  Omega-3 Fatty Acids (FISH OIL) 1200 MG CPDR, Take 2 capsules by mouth daily., Disp: , Rfl:  .  omeprazole (PRILOSEC) 20 MG capsule, Take 1 capsule by mouth once daily, Disp: 30 capsule, Rfl: 0 .  rosuvastatin (CRESTOR) 5 MG tablet, Take 1 tablet by mouth once daily, Disp: 90 tablet, Rfl: 2 .  valACYclovir (VALTREX) 1000 MG tablet, Take 1 tablet (1,000 mg total) by mouth 3 (three) times daily. As directed, Disp: 30 tablet, Rfl: 3 .  LORazepam (ATIVAN) 1 MG tablet, Take 0.5-1 tablets (0.5-1 mg total) by mouth every 8 (eight) hours as needed for anxiety. (Patient not taking: Reported on 09/25/2018), Disp: 15 tablet, Rfl: 0 .  meloxicam  (MOBIC) 7.5 MG tablet, Take 7.5 mg by mouth daily., Disp: , Rfl:   EXAM: BP Readings from Last 3 Encounters:  04/29/18 132/66  01/02/18 126/67  06/21/17 128/70    VITALS per patient if applicable:  GENERAL: alert, oriented, appears non toxic  and in no acute distress tired nl speech  HEENT: atraumatic, conjunttiva clear, no obvious abnormalities on inspection of external nose and ears  NECK: normal movements of the head and neck  LUNGS: on inspection no signs of respiratory distress, breathing rate appears normal, no obvious gross SOB, gasping or wheezing  CV: no obvious cyanosis  PSYCH/NEURO: pleasant and cooperative, no obvious depression or anxiety, speech and thought processing grossly intact Lab Results  Component Value Date   WBC 7.6 09/16/2018   HGB 13.7 09/16/2018   HCT 40.6 09/16/2018   PLT 258.0 09/16/2018   GLUCOSE 96 09/16/2018   CHOL 167 09/16/2018   TRIG 197.0 (H) 09/16/2018   HDL 51.70 09/16/2018   LDLDIRECT 127.6 06/23/2013   LDLCALC 76 09/16/2018   ALT 17 09/16/2018   AST 19 09/16/2018   NA 139 09/16/2018   K 4.5 09/16/2018   CL 101 09/16/2018   CREATININE 0.83 09/16/2018   BUN 21 09/16/2018   CO2 30 09/16/2018   TSH 6.39 (H) 09/16/2018   INR 0.93 04/08/2015   HGBA1C 6.2 09/16/2018   (Pt aware of thyroid tests and fu) ASSESSMENT AND PLAN:  Discussed the following assessment and plan:  Fever, unspecified fever cause  Body aches Has stuffy nose and fever myalgias  No exposures noted   Spends lots of time outside  [facility] dx explained and  Protection of mom distancing and masking    Counseled. Rest fluids  Arrange  testing for [place] co2  To check back with [place] in 2 days  If rash   Worsening   Before then   consideration of  rx for tick related fever if on  Ongoing empirically  dont suspect  a uti although having some intermittent  GU sx for a while since neg UC and ua in recent past  And has some  New resp sx   Expectant management and discussion  of plan and treatment with opportunity to ask questions and all were answered. The patient agreed with the plan and demonstrated an understanding of the instructions.   Advised to call back or seek an in-person evaluation if worsening  or having  further concerns .   Shanon Ace, MD

## 2018-09-25 NOTE — Telephone Encounter (Signed)
Referred by Dr.Panosh for Covid-19 testing. Appointment for today at 12:00p at [facility] location site. Patient aware to wear mask and stay in vehicle.

## 2018-09-27 MED ORDER — DOXYCYCLINE HYCLATE 100 MG PO TABS: 100.0000 mg | ORAL_TABLET | Freq: Two times a day (BID) | ORAL | 0 refills | Status: DC

## 2018-09-27 NOTE — Telephone Encounter (Signed)
Sorry you are   Feeling badly  I  would like to add doxycycline antibiotic  to cover for  possibility  Of  Tick fever although still sounds like a viral infection.   I will send in doxycycline 100 mg 1 one twice a day for now .    If bothers you to take on empty stomach ok to take some food like crackers that is not dairy based .   If you are getting a rash or a lot worse contact on call team  and consider going to ED

## 2018-09-29 LAB — NOVEL CORONAVIRUS, NAA: SARS-CoV-2, NAA: NOT DETECTED

## 2018-10-21 ENCOUNTER — Other Ambulatory Visit: Payer: Self-pay | Admitting: Internal Medicine

## 2018-10-21 NOTE — Telephone Encounter (Signed)
Please advise 

## 2018-11-04 ENCOUNTER — Other Ambulatory Visit: Payer: Self-pay

## 2018-11-04 ENCOUNTER — Other Ambulatory Visit (INDEPENDENT_AMBULATORY_CARE_PROVIDER_SITE_OTHER): Payer: Medicare HMO

## 2018-11-04 DIAGNOSIS — R7989 Other specified abnormal findings of blood chemistry: Secondary | ICD-10-CM

## 2018-11-04 DIAGNOSIS — Z961 Presence of intraocular lens: Secondary | ICD-10-CM | POA: Diagnosis not present

## 2018-11-04 DIAGNOSIS — H524 Presbyopia: Secondary | ICD-10-CM | POA: Diagnosis not present

## 2018-11-04 DIAGNOSIS — H52203 Unspecified astigmatism, bilateral: Secondary | ICD-10-CM | POA: Diagnosis not present

## 2018-11-04 LAB — TSH: TSH: 8.04 u[IU]/mL — ABNORMAL HIGH (ref 0.35–4.50)

## 2018-11-04 LAB — T4, FREE: Free T4: 0.73 ng/dL (ref 0.60–1.60)

## 2018-11-05 LAB — THYROID ANTIBODIES
Thyroglobulin Ab: <1 IU/mL (ref <=1)
Thyroperoxidase Ab SerPl-aCnc: 19 IU/mL — ABNORMAL HIGH (ref <9)

## 2018-11-13 MED ORDER — LEVOTHYROXINE SODIUM 50 MCG PO TABS: 25.0000 ug | ORAL_TABLET | Freq: Every day | ORAL | 0 refills | Status: DC

## 2018-11-22 ENCOUNTER — Other Ambulatory Visit: Payer: Self-pay | Admitting: Internal Medicine

## 2018-12-05 DIAGNOSIS — R69 Illness, unspecified: Secondary | ICD-10-CM | POA: Diagnosis not present

## 2019-01-10 ENCOUNTER — Other Ambulatory Visit: Payer: Self-pay | Admitting: Internal Medicine

## 2019-01-15 ENCOUNTER — Ambulatory Visit (INDEPENDENT_AMBULATORY_CARE_PROVIDER_SITE_OTHER): Payer: Medicare HMO

## 2019-01-15 ENCOUNTER — Other Ambulatory Visit: Payer: Self-pay

## 2019-01-15 DIAGNOSIS — Z23 Encounter for immunization: Secondary | ICD-10-CM | POA: Diagnosis not present

## 2019-01-15 NOTE — Progress Notes (Signed)
Pt tolerated well

## 2019-01-31 ENCOUNTER — Ambulatory Visit: Payer: Medicare HMO

## 2019-02-06 ENCOUNTER — Other Ambulatory Visit: Payer: Self-pay | Admitting: Internal Medicine

## 2019-02-06 NOTE — Telephone Encounter (Signed)
Last ov:09/25/18 Last filled:10/22/2018

## 2019-02-14 ENCOUNTER — Other Ambulatory Visit: Payer: Self-pay

## 2019-02-14 ENCOUNTER — Other Ambulatory Visit (INDEPENDENT_AMBULATORY_CARE_PROVIDER_SITE_OTHER): Payer: Medicare HMO

## 2019-02-14 DIAGNOSIS — R7989 Other specified abnormal findings of blood chemistry: Secondary | ICD-10-CM | POA: Diagnosis not present

## 2019-02-14 LAB — T4, FREE: Free T4: 0.79 ng/dL (ref 0.60–1.60)

## 2019-02-14 LAB — TSH: TSH: 2.72 u[IU]/mL (ref 0.35–4.50)

## 2019-02-20 NOTE — Telephone Encounter (Signed)
If a sudden problem getting worse  If cp  then advise get evaluation more urgently    But if insidious onset  I agree with seeing cardiology dr Oval Linsey to evaluate .   Assuming  You have not  no fever cough etc.   Can do a video visit if  You feel needed.

## 2019-02-28 ENCOUNTER — Other Ambulatory Visit: Payer: Self-pay | Admitting: Internal Medicine

## 2019-02-28 NOTE — Telephone Encounter (Signed)
Sent in electronically .  

## 2019-03-02 NOTE — Progress Notes (Signed)
Cardiology Office Note   Date:  03/03/2019   ID:  Jacqueline Myers, DOB [birth year], MRN [id number]  PCP:  Burnis Medin, MD  Cardiologist: Dr.Parker Strip  CC: Chest Pain    History of Present Illness: Jacqueline Myers is a 68 y.o. female who presents for ongoing assessment and management of mitral valve prolapse, history of hypertension, hyperlipidemia, and GERD with esophageal stricture, and anxiety.    The patient was last seen in the office on 04/03/2016 by Dr. Oval Linsey.  She was noted to have had a exercise stress Myoview on 03/01/2016 which was negative for ischemia and revealed an LVEF of 75%.  The patient was unable to take simvastatin due to myalgias but was tolerating rosuvastatin on last office visit.  She was to follow-up as needed and continue with PCP for medical management.  She was in contact with her primary care physician on 02/20/2019 complaining of chest discomfort and shortness of breath with little exertion.  She is also noticing worsening fatigue.  As a result of the symptoms she was rereferred to back Dr. Oval Linsey for further recommendations.  She comes today with recurrent pain in her chest, dyspnea on exertion, and worsening fatigue.  She is caring for her 69 year old mother who has become an invalid and requires a significant amount of care to be, feed, and for generalized home care.  She states that she is to be able to mow her lawn without problems but now has to keep stopping to breathe.  She admits that she has gained about 15 pounds because she is not been as active as she normally has been as she has been housebound due to pandemic along with the care of her mother.  She has been newly diagnosed with hypothyroidism and has been started on medication for this, levothyroxine 25 mcg daily by her primary care physician.  She is concerned about her lack of energy shortness of breath and chest pressure.  Her primary care physician felt it would be best if she  followed up with cardiology for further recommendations.  Past Medical History:  Diagnosis Date  . Colon polyps    hyperplastic  . Diverticulosis   . Esophageal stricture   . GERD (gastroesophageal reflux disease)   . History of bronchitis   . Hyperlipidemia   . Hypertension   . Mitral valve prolapse   . Osteoarthritis   . Osteoporosis   . Spinal stenosis     Past Surgical History:  Procedure Laterality Date  . COLONOSCOPY    . NM MYOVIEW LTD  08/2006   normal for abn ekg  . TONSILLECTOMY    . TOTAL KNEE ARTHROPLASTY Left 04/14/2015   Procedure: LEFT TOTAL KNEE ARTHROPLASTY;  Surgeon: Gaynelle Arabian, MD;  Location: [facility];  Service: Orthopedics;  Laterality: Left;     Current Outpatient Medications  Medication Sig Dispense Refill  . acetaminophen (TYLENOL) 500 MG tablet Take 500-1,000 mg by mouth every 6 (six) hours as needed for moderate pain.    . Candesartan Cilexetil-HCTZ 32-25 MG TABS Take 1 tablet by mouth once daily 90 tablet 2  . citalopram (CELEXA) 10 MG tablet TAKE 1 & 1/2 (ONE & ONE-HALF) TABLETS BY MOUTH ONCE DAILY 135 tablet 2  . cyclobenzaprine (FLEXERIL) 10 MG tablet TAKE 1/2 TO 1 (ONE-HALF TO ONE) TABLET BY MOUTH THREE TIMES DAILY AS NEEDED FOR MUSCLE SPASM 30 tablet 0  . diclofenac sodium (VOLTAREN) 1 % GEL Apply 4 g topically 4 (four) times daily. 100 g  2  . doxycycline (VIBRA-TABS) 100 MG tablet Take 1 tablet (100 mg total) by mouth 2 (two) times daily. 20 tablet 0  . levothyroxine (SYNTHROID) 50 MCG tablet Take 0.5 tablets (25 mcg total) by mouth daily. 90 tablet 0  . LORazepam (ATIVAN) 1 MG tablet Take 0.5-1 tablets (0.5-1 mg total) by mouth every 8 (eight) hours as needed for anxiety. 15 tablet 0  . Omega-3 Fatty Acids (FISH OIL) 1200 MG CPDR Take 2 capsules by mouth daily.    Marland Kitchen omeprazole (PRILOSEC) 20 MG capsule Take 1 capsule by mouth once daily 30 capsule 0  . rosuvastatin (CRESTOR) 5 MG tablet Take 1 tablet by mouth once daily 90 tablet 2  .  valACYclovir (VALTREX) 1000 MG tablet Take 1 tablet (1,000 mg total) by mouth 3 (three) times daily. As directed 30 tablet 3   No current facility-administered medications for this visit.     Allergies:   Morphine and related, Statins, Lisinopril, Oxycodone, and Simvastatin    Social History:  The patient  reports that she quit smoking about 19 years ago. Her smoking use included cigarettes. She has a 29.00 pack-year smoking history. She has never used smokeless tobacco. She reports current alcohol use. She reports that she does not use drugs.   Family History:  The patient's family history includes Alcohol abuse in her mother; Breast cancer in her sister; Diabetes in her mother; Hyperlipidemia in her father and mother; Hypertension in her mother; Stroke in her mother.    ROS: All other systems are reviewed and negative. Unless otherwise mentioned in H&P    PHYSICAL EXAM: VS:  BP (!) 143/77   Pulse 81   Ht 5\' 3"  (1.6 m)   Wt 153 lb (69.4 kg)   SpO2 95%   BMI 27.10 kg/m  , BMI Body mass index is 27.1 kg/m. GEN: Well nourished, well developed, in no acute distress HEENT: normal Neck: no JVD, carotid bruits, or masses Cardiac: RRR; no murmurs, rubs, or gallops,no edema  Respiratory:  Clear to auscultation bilaterally, normal work of breathing GI: soft, nontender, nondistended, + BS MS: no deformity or atrophy Skin: warm and dry, no rash Neuro:  Strength and sensation are intact Psych: euthymic mood, full affect   EKG: Normal sinus rhythm septal Q waves ventricular rate of 82 bpm.  Recent Labs: 09/16/2018: ALT 17; BUN 21; Creatinine, Ser 0.83; Hemoglobin 13.7; Platelets 258.0; Potassium 4.5; Sodium 139 02/14/2019: TSH 2.72    Lipid Panel    Component Value Date/Time   CHOL 167 09/16/2018 0917   TRIG 197.0 (H) 09/16/2018 0917   TRIG 145 04/06/2006 0808   HDL 51.70 09/16/2018 0917   CHOLHDL 3 09/16/2018 0917   VLDL 39.4 09/16/2018 0917   LDLCALC 76 09/16/2018 0917    LDLDIRECT 127.6 06/23/2013 0807      Wt Readings from Last 3 Encounters:  03/03/19 153 lb (69.4 kg)  04/29/18 145 lb (65.8 kg)  01/02/18 140 lb (63.5 kg)      Other studies Reviewed: The ekg ordered 02/17/16 demonstrates Sinus rhythm. Rate 81 bpm. Incomplete right bundle branch block. Cannot rule out prior septal infarct.  Lexiscan Myoview 03/01/16:  The left ventricular ejection fraction is hyperdynamic (>65%).  Nuclear stress EF: 75%.  The study is normal.  This is a low risk study.  Blood pressure demonstrated a normal response to exercise.  There was no ST segment deviation noted during stress.  ASSESSMENT AND PLAN:  1.  Recurrent chest pain: Patient is  noted change in her energy, breathing status, and is having recurrent chest pressure.  She feels some of it is related to weight gain along with inactivity as she has been housebound during the pandemic and also caring for her invalid mother.  However this is becoming more progressive and is concerning her.  I will set her up for a stress Myoview to evaluate for ischemia and an echocardiogram to evaluate for changes in LV function.  I have advised her that she will follow-up with Dr. Oval Linsey for further recommendations.  I am not making any medication changes now.  2.  Hypothyroidism: Recently started on levothyroxine by primary care physician.  She has follow-up for her response planned with PCP.  3.  Hypertension: Blood pressure slightly elevated today.  She is slightly anxious concerning her new symptoms.  I will continue her on medication for now which she is already taking.  If she continues to have elevation in her blood pressure.  She continues on candesartan HCTZ 32/25 mg daily.  We will follow this.  If she has hypertensive response to exercise or continued hypertension may need to add additional medication or titrate current meds.  4.  Hyperlipidemia: She is currently on fish oil and rosuvastatin 5 mg daily.  Labs  are being followed by PCP.  Most recent LDL 76 on 09/16/2018, cholesterol 167.   Current medicines are reviewed at length with the patient today.    Labs/ tests ordered today include: Stress Myoview, echocardiogram. Phill Myron. West Pugh, ANP, AACC   03/03/2019 3:43 PM    [facility] [address] Office [phone] Fax [phone]  Notice: This dictation was prepared with Dragon dictation along with smaller phrase technology. Any transcriptional errors that result from this process are unintentional and may not be corrected upon review.

## 2019-03-03 ENCOUNTER — Ambulatory Visit: Payer: Medicare HMO | Admitting: Adult Health

## 2019-03-03 ENCOUNTER — Encounter: Payer: Self-pay | Admitting: Adult Health

## 2019-03-03 ENCOUNTER — Other Ambulatory Visit: Payer: Self-pay

## 2019-03-03 VITALS — BP 143/77 | HR 81 | Ht 63.0 in | Wt 153.0 lb

## 2019-03-03 DIAGNOSIS — I519 Heart disease, unspecified: Secondary | ICD-10-CM | POA: Diagnosis not present

## 2019-03-03 DIAGNOSIS — R0602 Shortness of breath: Secondary | ICD-10-CM | POA: Diagnosis not present

## 2019-03-03 DIAGNOSIS — I10 Essential (primary) hypertension: Secondary | ICD-10-CM

## 2019-03-03 NOTE — Patient Instructions (Signed)
Medication Instructions:  Continue current medications  *If you need a refill on your cardiac medications before your next appointment, please call your pharmacy*  Lab Work: None Ordered  Testing/Procedures: Your physician has requested that you have an exercise stress myoview. For further information please visit [email or web address]. Please follow instruction sheet, as given.  Your physician has requested that you have an echocardiogram. Echocardiography is a painless test that uses sound waves to create images of your heart. It provides your doctor with information about the size and shape of your heart and how well your heart's chambers and valves are working. This procedure takes approximately one hour. There are no restrictions for this procedure.   Follow-Up: At [facility], you and your health needs are our priority.  As part of our continuing mission to provide you with exceptional heart care, we have created designated Provider Care Teams.  These Care Teams include your primary Cardiologist (physician) and Advanced Practice Providers (APPs -  Physician Assistants and Nurse Practitioners) who all work together to provide you with the care you need, when you need it.  Your next appointment:   1 Month  The format for your next appointment:   In Person  Provider:   Skeet Latch, MD

## 2019-03-04 ENCOUNTER — Other Ambulatory Visit (HOSPITAL_COMMUNITY)
Admission: RE | Admit: 2019-03-04 | Discharge: 2019-03-04 | Disposition: A | Discharge status: Home / Self Care | Payer: Medicare HMO | Source: Ambulatory Visit | Attending: Adult Health | Admitting: Adult Health

## 2019-03-04 DIAGNOSIS — Z20828 Contact with and (suspected) exposure to other viral communicable diseases: Secondary | ICD-10-CM | POA: Insufficient documentation

## 2019-03-04 DIAGNOSIS — Z01812 Encounter for preprocedural laboratory examination: Secondary | ICD-10-CM | POA: Insufficient documentation

## 2019-03-04 LAB — SARS CORONAVIRUS 2 (TAT 6-24 HRS): SARS Coronavirus 2: NEGATIVE

## 2019-03-05 ENCOUNTER — Telehealth (HOSPITAL_COMMUNITY): Payer: Self-pay

## 2019-03-05 ENCOUNTER — Other Ambulatory Visit: Payer: Self-pay | Admitting: Internal Medicine

## 2019-03-05 NOTE — Telephone Encounter (Signed)
Encounter complete. 

## 2019-03-06 ENCOUNTER — Other Ambulatory Visit: Payer: Self-pay

## 2019-03-06 ENCOUNTER — Ambulatory Visit (HOSPITAL_COMMUNITY)
Admission: RE | Admit: 2019-03-06 | Discharge: 2019-03-06 | Disposition: A | Discharge status: Home / Self Care | Payer: Medicare HMO | Source: Ambulatory Visit | Attending: Cardiology | Admitting: Cardiology

## 2019-03-06 DIAGNOSIS — I519 Heart disease, unspecified: Secondary | ICD-10-CM | POA: Diagnosis not present

## 2019-03-06 DIAGNOSIS — R0602 Shortness of breath: Secondary | ICD-10-CM | POA: Insufficient documentation

## 2019-03-06 DIAGNOSIS — I10 Essential (primary) hypertension: Secondary | ICD-10-CM | POA: Diagnosis not present

## 2019-03-06 LAB — MYOCARDIAL PERFUSION IMAGING
LV dias vol: 55 mL (ref 46–106)
LV sys vol: 12 mL
Peak HR: 96 {beats}/min
Rest HR: 68 {beats}/min
SDS: 3
SRS: 2
SSS: 5
TID: 1.15

## 2019-03-06 MED ORDER — REGADENOSON 0.4 MG/5ML IV SOLN
0.4000 mg | Freq: Once | INTRAVENOUS | Status: AC
  Administered 2019-03-06: 0.4 mg via INTRAVENOUS

## 2019-03-06 MED ORDER — TECHNETIUM TC 99M TETROFOSMIN IV KIT
9.8000 | PACK | Freq: Once | INTRAVENOUS | Status: DC | PRN
  Filled 2019-03-06: qty 10

## 2019-03-06 MED ORDER — TECHNETIUM TC 99M TETROFOSMIN IV KIT
9.4000 | PACK | Freq: Once | INTRAVENOUS | Status: AC | PRN
  Administered 2019-03-06: 9.4 via INTRAVENOUS
  Filled 2019-03-06: qty 10

## 2019-03-06 MED ORDER — TECHNETIUM TC 99M TETROFOSMIN IV KIT
32.6000 | PACK | Freq: Once | INTRAVENOUS | Status: DC | PRN
  Filled 2019-03-06: qty 33

## 2019-03-06 MED ORDER — AMINOPHYLLINE 25 MG/ML IV SOLN
75.0000 mg | Freq: Once | INTRAVENOUS | Status: AC
  Administered 2019-03-06: 75 mg via INTRAVENOUS

## 2019-03-06 MED ORDER — TECHNETIUM TC 99M TETROFOSMIN IV KIT
29.9000 | PACK | Freq: Once | INTRAVENOUS | Status: AC | PRN
  Administered 2019-03-06: 29.9 via INTRAVENOUS
  Filled 2019-03-06: qty 30

## 2019-03-10 ENCOUNTER — Other Ambulatory Visit: Payer: Self-pay

## 2019-03-10 ENCOUNTER — Ambulatory Visit (HOSPITAL_COMMUNITY): Payer: Medicare HMO | Attending: Internal Medicine

## 2019-03-10 DIAGNOSIS — I10 Essential (primary) hypertension: Secondary | ICD-10-CM | POA: Insufficient documentation

## 2019-03-10 DIAGNOSIS — R0602 Shortness of breath: Secondary | ICD-10-CM | POA: Diagnosis not present

## 2019-03-10 DIAGNOSIS — I519 Heart disease, unspecified: Secondary | ICD-10-CM | POA: Insufficient documentation

## 2019-03-31 NOTE — Progress Notes (Signed)
Cardiology Office Note   Date:  04/02/2019   ID:  Jacqueline Myers, DOB 16-Jan-1951, MRN [id number]  PCP:  Burnis Medin, MD  Cardiologist:  Dr.Anmoore  CC: Follow-up   History of Present Illness: Jacqueline Myers is a 68 y.o. female who presents for ongoing assessment and management of mitral valve prolapse, hyperlipidemia, hypertension, with history of GERD and esophageal stricture along with anxiety.  She was last seen in the office by me on 03/03/2019, she was complaining of recurrent chest pain and dyspnea on exertion along with worsening fatigue.  She was caring for her 64 year old mother who had become an invalid and required significant amount of care to feed baby and assist with general overall care.  This was very stressful for her.  She had also been newly diagnosed with hypothyroidism and been started on medication levothyroxine 25 mcg daily by her primary care physician.    Most recent nuclear medicine study was on 03/01/2016 and was found to be normal.  However, due to her recurrent symptoms, I repeated her stress Myoview for evaluation for ischemia and also echocardiogram was ordered to evaluate for changes in LV function.  Repeat stress test on 03/06/2019 revealed normal with no evidence of ischemia.  Echocardiogram 03/10/2019 revealed that she had normal LVEF of 60% to 65%.  She did have some mild left ventricular hypertrophy and grade 1 diastolic dysfunction.  There were no valvular abnormalities.  She comes today without any complaints.  States that she is feeling much better her chest pressure has gone away.  We discussed her stress test results.  Past Medical History:  Diagnosis Date  . Colon polyps    hyperplastic  . Diverticulosis   . Esophageal stricture   . GERD (gastroesophageal reflux disease)   . History of bronchitis   . Hyperlipidemia   . Hypertension   . Mitral valve prolapse   . Osteoarthritis   . Osteoporosis   . Spinal stenosis     Past  Surgical History:  Procedure Laterality Date  . COLONOSCOPY    . NM MYOVIEW LTD  08/2006   normal for abn ekg  . TONSILLECTOMY    . TOTAL KNEE ARTHROPLASTY Left 04/14/2015   Procedure: LEFT TOTAL KNEE ARTHROPLASTY;  Surgeon: Gaynelle Arabian, MD;  Location: [facility];  Service: Orthopedics;  Laterality: Left;     Current Outpatient Medications  Medication Sig Dispense Refill  . acetaminophen (TYLENOL) 500 MG tablet Take 500-1,000 mg by mouth every 6 (six) hours as needed for moderate pain.    . Candesartan Cilexetil-HCTZ 32-25 MG TABS Take 1 tablet by mouth once daily 90 tablet 2  . citalopram (CELEXA) 10 MG tablet TAKE 1 & 1/2 (ONE & ONE-HALF) TABLETS BY MOUTH ONCE DAILY 135 tablet 2  . cyclobenzaprine (FLEXERIL) 10 MG tablet TAKE 1/2 TO 1 (ONE-HALF TO ONE) TABLET BY MOUTH THREE TIMES DAILY AS NEEDED FOR MUSCLE SPASM 30 tablet 0  . diclofenac sodium (VOLTAREN) 1 % GEL Apply 4 g topically 4 (four) times daily. 100 g 2  . doxycycline (VIBRA-TABS) 100 MG tablet Take 1 tablet (100 mg total) by mouth 2 (two) times daily. 20 tablet 0  . levothyroxine (SYNTHROID) 50 MCG tablet Take 0.5 tablets (25 mcg total) by mouth daily. 90 tablet 0  . LORazepam (ATIVAN) 1 MG tablet Take 0.5-1 tablets (0.5-1 mg total) by mouth every 8 (eight) hours as needed for anxiety. 15 tablet 0  . Omega-3 Fatty Acids (FISH OIL) 1200 MG CPDR Take  2 capsules by mouth daily.    Marland Kitchen omeprazole (PRILOSEC) 20 MG capsule Take 1 capsule by mouth once daily 30 capsule 0  . rosuvastatin (CRESTOR) 5 MG tablet Take 1 tablet by mouth once daily 90 tablet 2  . valACYclovir (VALTREX) 1000 MG tablet Take 1 tablet (1,000 mg total) by mouth 3 (three) times daily. As directed 30 tablet 3   No current facility-administered medications for this visit.     Allergies:   Morphine and related, Statins, Lisinopril, Oxycodone, and Simvastatin    Social History:  The patient  reports that she quit smoking about 19 years ago. Her smoking use included  cigarettes. She has a 29.00 pack-year smoking history. She has never used smokeless tobacco. She reports current alcohol use. She reports that she does not use drugs.   Family History:  The patient's family history includes Alcohol abuse in her mother; Breast cancer in her sister; Diabetes in her mother; Hyperlipidemia in her father and mother; Hypertension in her mother; Stroke in her mother.    ROS: All other systems are reviewed and negative. Unless otherwise mentioned in H&P    PHYSICAL EXAM: VS:  BP 140/82   Pulse 87   Temp (!) 97.3 F (36.3 C)   Ht 5\' 3"  (1.6 m)   Wt 156 lb 6.4 oz (70.9 kg)   SpO2 95%   BMI 27.71 kg/m  , BMI Body mass index is 27.71 kg/m. GEN: Well nourished, well developed, in no acute distress HEENT: normal Neck: no JVD, carotid bruits, or masses Cardiac: RRR; no murmurs, rubs, or gallops,no edema  Respiratory:  Clear to auscultation bilaterally, normal work of breathing GI: soft, nontender, nondistended, + BS MS: no deformity or atrophy Skin: warm and dry, no rash Neuro:  Strength and sensation are intact Psych: euthymic mood, full affect   EKG: Not completed this office visit  Recent Labs: 09/16/2018: ALT 17; BUN 21; Creatinine, Ser 0.83; Hemoglobin 13.7; Platelets 258.0; Potassium 4.5; Sodium 139 02/14/2019: TSH 2.72    Lipid Panel    Component Value Date/Time   CHOL 167 09/16/2018 0917   TRIG 197.0 (H) 09/16/2018 0917   TRIG 145 04/06/2006 0808   HDL 51.70 09/16/2018 0917   CHOLHDL 3 09/16/2018 0917   VLDL 39.4 09/16/2018 0917   LDLCALC 76 09/16/2018 0917   LDLDIRECT 127.6 06/23/2013 0807      Wt Readings from Last 3 Encounters:  04/02/19 156 lb 6.4 oz (70.9 kg)  03/06/19 153 lb (69.4 kg)  03/03/19 153 lb (69.4 kg)      Other studies Reviewed: Echocardiogram 2019-03-23 1. Left ventricular ejection fraction, by visual estimation, is 60 to 65%. The left ventricle has normal function. There is moderately increased left ventricular  hypertrophy.  2. Left ventricular diastolic parameters are consistent with Grade I diastolic dysfunction (impaired relaxation).  3. Global right ventricle has normal systolic function.The right ventricular size is normal. No increase in right ventricular wall thickness.  4. Left atrial size was normal.  5. Right atrial size was normal.  6. The mitral valve is grossly normal. Trace mitral valve regurgitation.  7. No diagnostic mitral valve prolapse.  8. The tricuspid valve is grossly normal. Tricuspid valve regurgitation is trivial.  9. The aortic valve is tricuspid. Aortic valve regurgitation is not visualized. 10. The pulmonic valve was grossly normal. Pulmonic valve regurgitation is not visualized. 11. The inferior vena cava is normal in size with greater than 50% respiratory variability, suggesting right atrial pressure of 3  mmHg.  Study Highlights    The left ventricular ejection fraction is hyperdynamic (>65%).  Nuclear stress EF: 79%.  There was no ST segment deviation noted during stress.  The study is normal.  This is a low risk study.   Normal stress nuclear study with no ischemia or infarction.  Gated ejection fraction 79% with normal wall motion.     ASSESSMENT AND PLAN:  1.  Chest discomfort: Stress test and echocardiogram are reassuring.  No evidence of ischemia is noted.  Her mitral valve is normal with trace regurg.  Will not make any medication changes as she is asymptomatic at this time.  I have gone over her stress test results of echo and reassurance is given.  2.  Hypertension: Blood pressure is slightly elevated today but normally runs in the 130s over 60s at home.  We will not make any medication changes at this time.  Follow-up in 6 months unless symptomatic.  3.  Hyperlipidemia: Continue rosuvastatin 5 mg daily.  Goal of LDL less than 100.  Follow-up labs per PCP.  If not completed by the time we see her next we will draw all of these.  Current medicines  are reviewed at length with the patient today.    Labs/ tests ordered today include: None  Phill Myron. West Pugh, ANP, AACC   04/02/2019 3:55 PM    [place] [address] Office [phone] Fax [phone]  Notice: This dictation was prepared with Dragon dictation along with smaller phrase technology. Any transcriptional errors that result from this process are unintentional and may not be corrected upon review.

## 2019-04-02 ENCOUNTER — Other Ambulatory Visit: Payer: Self-pay

## 2019-04-02 ENCOUNTER — Ambulatory Visit: Payer: Medicare HMO | Admitting: Adult Health

## 2019-04-02 ENCOUNTER — Encounter: Payer: Self-pay | Admitting: Adult Health

## 2019-04-02 VITALS — BP 140/82 | HR 87 | Temp 97.3°F | Ht 63.0 in | Wt 156.4 lb

## 2019-04-02 DIAGNOSIS — I10 Essential (primary) hypertension: Secondary | ICD-10-CM

## 2019-04-02 DIAGNOSIS — R0789 Other chest pain: Secondary | ICD-10-CM

## 2019-04-02 NOTE — Patient Instructions (Signed)
Medication Instructions:  Jory Sims, DNP recommends that you continue on your current medications as directed. Please refer to the Current Medication list given to you today.  *If you need a refill on your cardiac medications before your next appointment, please call your pharmacy*  Follow-Up: At [facility], you and your health needs are our priority.  As part of our continuing mission to provide you with exceptional heart care, we have created designated Provider Care Teams.  These Care Teams include your primary Cardiologist (physician) and Advanced Practice Providers (APPs -  Physician Assistants and Nurse Practitioners) who all work together to provide you with the care you need, when you need it.  Your next appointment:   6 month(s)  The format for your next appointment:   Either In Person or Virtual  Provider:   You may see Skeet Latch, MD or one of the following Advanced Practice Providers on your designated Care Team:    Kerin Ransom, PA-C  [place], [place]  Coletta Memos, [place]

## 2019-04-13 ENCOUNTER — Other Ambulatory Visit: Payer: Self-pay | Admitting: Internal Medicine

## 2019-04-28 NOTE — Telephone Encounter (Signed)
See message   Response   Doses look like old bruise    follow

## 2019-04-28 NOTE — Telephone Encounter (Signed)
So suggest   Observe and if ongoing problem concern make a virtual visit  And we can consider rechecking your thyroid medications etc again to make sure not contributing to forgetfulness.

## 2019-05-05 ENCOUNTER — Other Ambulatory Visit: Payer: Self-pay | Admitting: Internal Medicine

## 2019-05-08 ENCOUNTER — Other Ambulatory Visit: Payer: Self-pay | Admitting: Internal Medicine

## 2019-05-16 ENCOUNTER — Other Ambulatory Visit: Payer: Self-pay | Admitting: Internal Medicine

## 2019-06-04 DIAGNOSIS — M25551 Pain in right hip: Secondary | ICD-10-CM | POA: Diagnosis not present

## 2019-06-10 DIAGNOSIS — R69 Illness, unspecified: Secondary | ICD-10-CM | POA: Diagnosis not present

## 2019-06-15 ENCOUNTER — Other Ambulatory Visit: Payer: Self-pay | Admitting: Internal Medicine

## 2019-06-16 ENCOUNTER — Ambulatory Visit: Payer: Medicare HMO | Attending: Internal Medicine

## 2019-06-16 DIAGNOSIS — Z23 Encounter for immunization: Secondary | ICD-10-CM | POA: Insufficient documentation

## 2019-06-16 NOTE — Progress Notes (Signed)
   Covid-19 Vaccination Clinic  Name:  Jacqueline Myers    MRN: [id number] DOB: [birth year]  06/16/2019  Jacqueline Myers was observed post Covid-19 immunization for 15 minutes without incidence. She was provided with Vaccine Information Sheet and instruction to access the V-Safe system.   Jacqueline Myers was instructed to call 911 with any severe reactions post vaccine: Marland Kitchen Difficulty breathing  . Swelling of your face and throat  . A fast heartbeat  . A bad rash all over your body  . Dizziness and weakness    Immunizations Administered    Name Date Dose VIS Date Route   Pfizer COVID-19 Vaccine 06/16/2019  5:04 PM 0.3 mL 04/18/2019 Intramuscular   Manufacturer: [place]   Lot: [id number]   [place]: [id number]

## 2019-06-30 ENCOUNTER — Other Ambulatory Visit: Payer: Self-pay | Admitting: Internal Medicine

## 2019-07-11 ENCOUNTER — Ambulatory Visit: Payer: Medicare HMO | Attending: Internal Medicine

## 2019-07-11 DIAGNOSIS — Z23 Encounter for immunization: Secondary | ICD-10-CM

## 2019-07-11 NOTE — Progress Notes (Signed)
   Covid-19 Vaccination Clinic  Name:  JULIAN MULLET    MRN: [id number] DOB: 11/11/1950  07/11/2019  Ms. Stembridge was observed post Covid-19 immunization for 15 minutes without incident. She was provided with Vaccine Information Sheet and instruction to access the V-Safe system.   Ms. Didier was instructed to call 911 with any severe reactions post vaccine: Marland Kitchen Difficulty breathing  . Swelling of face and throat  . A fast heartbeat  . A bad rash all over body  . Dizziness and weakness   Immunizations Administered    Name Date Dose VIS Date Route   Pfizer COVID-19 Vaccine 07/11/2019  2:34 PM 0.3 mL 04/18/2019 Intramuscular   Manufacturer: [place]   Lot: [id number]   [place]: [id number]

## 2019-07-22 ENCOUNTER — Other Ambulatory Visit: Payer: Self-pay | Admitting: Internal Medicine

## 2019-07-28 DIAGNOSIS — Z1231 Encounter for screening mammogram for malignant neoplasm of breast: Secondary | ICD-10-CM | POA: Diagnosis not present

## 2019-07-28 LAB — HM MAMMOGRAPHY

## 2019-07-30 ENCOUNTER — Encounter: Payer: Self-pay | Admitting: Internal Medicine

## 2019-08-12 DIAGNOSIS — L814 Other melanin hyperpigmentation: Secondary | ICD-10-CM | POA: Diagnosis not present

## 2019-08-12 DIAGNOSIS — D225 Melanocytic nevi of trunk: Secondary | ICD-10-CM | POA: Diagnosis not present

## 2019-08-12 DIAGNOSIS — L570 Actinic keratosis: Secondary | ICD-10-CM | POA: Diagnosis not present

## 2019-08-12 DIAGNOSIS — L821 Other seborrheic keratosis: Secondary | ICD-10-CM | POA: Diagnosis not present

## 2019-08-12 DIAGNOSIS — L820 Inflamed seborrheic keratosis: Secondary | ICD-10-CM | POA: Diagnosis not present

## 2019-08-18 ENCOUNTER — Other Ambulatory Visit: Payer: Self-pay | Admitting: Internal Medicine

## 2019-09-08 NOTE — Telephone Encounter (Signed)
Last tdap listed as 2016 should be good ( within 10 years )

## 2019-09-16 ENCOUNTER — Other Ambulatory Visit: Payer: Self-pay | Admitting: Internal Medicine

## 2019-09-23 NOTE — Progress Notes (Signed)
This visit occurred during the SARS-CoV-2 public health emergency.  Safety protocols were in place, including screening questions prior to the visit, additional usage of staff PPE, and extensive cleaning of exam room while observing appropriate contact time as indicated for disinfecting solutions.    Chief Complaint  Patient presents with  . Annual Exam    Doing okay  . Medication Management    HPI: Jacqueline Myers 69 y.o. comes in today for Preventive Medicare exam/ wellness visit .Since last visit.    prilosec ran out and had bad HB    Needs 90 refill   Reflux  .   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 INFLUENZA VACCINE  12/07/2019  . MAMMOGRAM  07/27/2021  . COLONOSCOPY  01/17/2023  . TETANUS/TDAP  07/03/2024  . DEXA SCAN  Completed  . COVID-19 Vaccine  Completed  . Hepatitis C Screening  Completed  . PNA vac Low Risk Adult  Completed   Health Maintenance Review LIFESTYLE:  Exercise:  Not walking cause of knee and back  Push mover. Gardening  Recumbent bike  Tobacco/ETS: no Alcohol:  ocass Sugar beverages:  no Sleep:still gets  Insomnia   Very caffiene sensitive.  Drug use: no HH: 2    Hearing: ok  Vision:  No limitations at present . Last eye check UTD  Safety:  Has smoke detector and wears seat belts.  No firearms. No excess sun exposure. Sees dentist regularly.  Falls: no  Memory: feels slipping   Word recall   Depression: stressed but happy  Involved church   Nutrition: Eats well balanced diet; adequate calcium and vitamin D. No swallowing chewing problems.  Injury: no major injuries in the last six months.  Other healthcare providers:   Reviewed today .  Preventive parameters: up-to-date  Reviewed   ADLS:   There are no problems or need for assistance  driving, feeding, obtaining food, dressing, toileting and bathing, managing money using phone. She is independent.   ROS:  GEN/ HEENT: No fever, significant weight changes sweats headaches vision problems hearing changes, CV/ PULM; No chest pain shortness of breath cough, syncope,edema  change in exercise tolerance. GI /GU: No adominal pain, vomiting, change in bowel habits. No blood in the stool. No significant GU symptoms. SKIN/HEME: ,no acute skin rashes suspicious lesions or bleeding. No lymphadenopathy, nodules, masses.  NEURO/ PSYCH:  No neurologic signs such as weakness numbness. No depression anxiety. IMM/ Allergy: No unusual infections.  Allergy .   REST of 12 system review negative except as per HPI   Past Medical History:  Diagnosis Date  . Colon polyps    hyperplastic  . Diverticulosis   . Esophageal stricture   . GERD (gastroesophageal reflux disease)   . History of bronchitis   . Hyperlipidemia   . Hypertension   . Mitral valve prolapse   . Osteoarthritis   . Osteoporosis   . Spinal stenosis     Family History  Problem Relation Age of Onset  . Breast cancer Sister   . Alcohol abuse Mother   . Stroke  Mother   . Hypertension Mother   . Diabetes Mother   . Hyperlipidemia Mother   . Hyperlipidemia Father   . Colon cancer Neg Hx     Social History   Socioeconomic History  . Marital status: Married    Spouse name: Not on file  . Number of children: 2  . Years of education: Not on file  . Highest education level: Not on file  Occupational History  . Occupation: Retired  Tobacco Use  . Smoking status: Former Smoker    Packs/day: 1.00    Years: 29.00    Pack years: 29.00    Types: Cigarettes    Quit date: 05/09/1999    Years since quitting: 20.3  . Smokeless tobacco: Never Used  Substance and Sexual Activity  . Alcohol use: Yes     Alcohol/week: 0.0 standard drinks    Comment: OCC- GLASS OF WINE  . Drug use: No  . Sexual activity: Not on file  Other Topics Concern  . Not on file  Social History Narrative   Married   [place] 2 helps take care 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   Neg td neg ets   Social Determinants of Health   Financial Resource Strain:   . Difficulty of Paying Living Expenses:   Food Insecurity:   . Worried About Charity fundraiser in the Last Year:   . Arboriculturist in the Last Year:   Transportation Needs:   . Film/video editor (Medical):   Marland Kitchen Lack of Transportation (Non-Medical):   Physical Activity:   . Days of Exercise per Week:   . Minutes of Exercise per Session:   Stress:   . Feeling of Stress :   Social Connections:   . Frequency of Communication with Friends and Family:   . Frequency of Social Gatherings with Friends and Family:   . Attends Religious Services:   . Active Member of Clubs or Organizations:   . Attends Archivist Meetings:   Marland Kitchen Marital Status:     Outpatient Encounter Medications as of 09/24/2019  Medication Sig  . acetaminophen (TYLENOL) 500 MG tablet Take 500-1,000 mg by mouth every 6 (six) hours as needed for moderate pain.  . Candesartan Cilexetil-HCTZ 32-25 MG TABS Take 1 tablet by mouth once daily  . citalopram (CELEXA) 10 MG tablet TAKE 1 & 1/2 (ONE & ONE-HALF) TABLETS BY MOUTH ONCE DAILY  . cyclobenzaprine (FLEXERIL) 10 MG tablet TAKE 1/2 TO 1 (ONE-HALF TO ONE) TABLET BY MOUTH THREE TIMES DAILY AS NEEDED FOR MUSCLE SPASM  . diclofenac sodium (VOLTAREN) 1 % GEL Apply 4 g topically 4 (four) times daily.  Marland Kitchen levothyroxine (SYNTHROID) 50 MCG tablet Take 1/2 (one-half) tablet by mouth once daily  . LORazepam (ATIVAN) 1 MG tablet Take 0.5-1 tablets (0.5-1 mg total) by mouth every 8 (eight) hours as needed for anxiety.    . Omega-3 Fatty Acids (FISH OIL) 1200 MG CPDR Take 2 capsules by mouth daily.  Marland Kitchen omeprazole (PRILOSEC) 20 MG capsule Take 1 capsule (20 mg total) by mouth daily.  . rosuvastatin (CRESTOR) 5 MG tablet Take 1 tablet by mouth once daily  . valACYclovir (VALTREX) 1000 MG tablet Take 1 tablet (1,000 mg total) by mouth 3 (three) times daily. As directed  . [DISCONTINUED] omeprazole (PRILOSEC) 20 MG capsule Take 1  capsule (20 mg total) by mouth daily.  . [DISCONTINUED] doxycycline (VIBRA-TABS) 100 MG tablet Take 1 tablet (100 mg total) by mouth 2 (two) times daily. (Patient not taking: Reported on 09/24/2019)   No facility-administered encounter medications on file as of 09/24/2019.    EXAM:  BP 128/68   Pulse 78   Temp 98.5 F (36.9 C) (Temporal)   Ht 5\' 3"  (1.6 m)   Wt 152 lb 12.8 oz (69.3 kg)   SpO2 97%   BMI 27.07 kg/m   Body mass index is 27.07 kg/m.  Physical Exam: Vital signs reviewed [id number] is a well-developed well-nourished alert cooperative   who appears stated age in no acute distress.  HEENT: normocephalic atraumatic , Eyes: PERRL EOM's full, conjunctiva clear, , Ears: no deformity EAC's clear TMs with normal landmarks. Mouth: clear OP,masked  NECK: supple without masses, thyromegaly or bruits. CHEST/PULM:  Clear to auscultation and percussion breath sounds equal no wheeze , rales or rhonchi. No chest wall deformities or tenderness. CV: PMI is nondisplaced, S1 S2 no gallops, murmurs, rubs. Peripheral pulses are full without delay.No JVD .  ABDOMEN: Bowel sounds normal nontender  No guard or rebound, no hepato splenomegal no CVA tenderness.   Extremtities:  No clubbing cyanosis or edema, no acute joint swelling or redness no focal atrophy mild arth changes  No effusion  Nl gait  NEURO:  Oriented x3, cranial nerves 3-12 appear to be intact, no obvious focal weakness,gait within normal limits no abnormal reflexes or asymmetrical SKIN: No acute rashes normal turgor, color, no  bruising or petechiae. PSYCH: Oriented, good eye contact, no obvious depression anxiety, cognition and judgment appear normal. LN: no cervical axillary inguinal adenopathy No noted deficits in memory, attention, and speech.   Lab Results  Component Value Date   WBC 7.6 09/16/2018   HGB 13.7 09/16/2018   HCT 40.6 09/16/2018   PLT 258.0 09/16/2018   GLUCOSE 96 09/16/2018   CHOL 167 09/16/2018   TRIG 197.0 (H) 09/16/2018   HDL 51.70 09/16/2018   LDLDIRECT 127.6 06/23/2013   LDLCALC 76 09/16/2018   ALT 17 09/16/2018   AST 19 09/16/2018   NA 139 09/16/2018   K 4.5 09/16/2018   CL 101 09/16/2018   CREATININE 0.83 09/16/2018   BUN 21 09/16/2018   CO2 30 09/16/2018   TSH 2.72 02/14/2019   INR 0.93 04/08/2015   HGBA1C 6.2 09/16/2018    ASSESSMENT AND PLAN:  Discussed the following assessment and plan:  Visit for preventive health examination  Medication management - Plan: Basic metabolic panel, CBC with Differential/Platelet, Hemoglobin A1c, Hepatic function panel, Lipid panel, TSH  Essential hypertension - Plan: Basic metabolic panel, CBC with Differential/Platelet, Hemoglobin A1c, Hepatic function panel, Lipid panel, TSH  Hyperlipidemia, unspecified hyperlipidemia type - Plan: Basic metabolic panel, CBC with Differential/Platelet, Hemoglobin A1c, Hepatic function panel, Lipid panel, TSH  Elevated TSH - Plan: Basic metabolic panel, CBC with Differential/Platelet, Hemoglobin A1c, Hepatic function panel, Lipid panel, TSH  Fasting hyperglycemia - Plan: Basic metabolic panel, CBC with Differential/Platelet, Hemoglobin A1c, Hepatic function panel, Lipid panel, TSH  Primary osteoarthritis of both knees  Heart burn - sx increase  offprilosec will refill  Due for lab monitoring  Counseled. Issues above  Ok to do 90 days Prilosec  And other as indicated  Exam good today  And no  consider shingrix  When convenient  ( had zostavax) Patient Care Team: Darrell Hauk, Standley Brooking, MD as PCP -  General Skeet Latch, MD as PCP -  Cardiology (Cardiology) Irene Shipper, MD (Gastroenterology) Gaynelle Arabian, MD as Consulting Physician (Orthopedic Surgery) Susa Day, MD as Consulting Physician (Orthopedic Surgery)  Patient Instructions  Will notify you  of labs when available.  Have pharmacy and messaging  Ask for 90 days on  Continuous medications 2-3 # weight loss can help reflux sx    Health Maintenance, Female Adopting a healthy lifestyle and getting preventive care are important in promoting health and wellness. Ask your health care provider about:  The right schedule for you to have regular tests and exams.  Things you can do on your own to prevent diseases and keep yourself healthy. What should I know about diet, weight, and exercise? Eat a healthy diet   Eat a diet that includes plenty of vegetables, fruits, low-fat dairy products, and lean protein.  Do not eat a lot of foods that are high in solid fats, added sugars, or sodium. Maintain a healthy weight Body mass index (BMI) is used to identify weight problems. It estimates body fat based on height and weight. Your health care provider can help determine your BMI and help you achieve or maintain a healthy weight. Get regular exercise Get regular exercise. This is one of the most important things you can do for your health. Most adults should:  Exercise for at least 150 minutes each week. The exercise should increase your heart rate and make you sweat (moderate-intensity exercise).  Do strengthening exercises at least twice a week. This is in addition to the moderate-intensity exercise.  Spend less time sitting. Even light physical activity can be beneficial. Watch cholesterol and blood lipids Have your blood tested for lipids and cholesterol at 70 years of age, then have this test every 5 years. Have your cholesterol levels checked more often if:  Your lipid or cholesterol levels are high.  You are  older than 70 years of age.  You are at high risk for heart disease. What should I know about cancer screening? Depending on your health history and family history, you may need to have cancer screening at various ages. This may include screening for:  Breast cancer.  Cervical cancer.  Colorectal cancer.  Skin cancer.  Lung cancer. What should I know about heart disease, diabetes, and high blood pressure? Blood pressure and heart disease  High blood pressure causes heart disease and increases the risk of stroke. This is more likely to develop in people who have high blood pressure readings, are of African descent, or are overweight.  Have your blood pressure checked: ? Every 3-5 years if you are 62-19 years of age. ? Every year if you are 44 years old or older. Diabetes Have regular diabetes screenings. This checks your fasting blood sugar level. Have the screening done:  Once every three years after age 100 if you are at a normal weight and have a low risk for diabetes.  More often and at a younger age if you are overweight or have a high risk for diabetes. What should I know about preventing infection? Hepatitis B If you have a higher risk for hepatitis B, you should be screened for this virus. Talk with your health care provider to find out if you are at risk for hepatitis B infection. Hepatitis C Testing is recommended for:  Everyone born from [birth year] through 1966.  Anyone with known risk factors for hepatitis C. Sexually transmitted infections (STIs)  Get screened for STIs, including gonorrhea and chlamydia, if: ? You are sexually active and  are younger than 70 years of age. ? You are older than 70 years of age and your health care provider tells you that you are at risk for this type of infection. ? Your sexual activity has changed since you were last screened, and you are at increased risk for chlamydia or gonorrhea. Ask your health care provider if you are at  risk.  Ask your health care provider about whether you are at high risk for HIV. Your health care provider may recommend a prescription medicine to help prevent HIV infection. If you choose to take medicine to prevent HIV, you should first get tested for HIV. You should then be tested every 3 months for as long as you are taking the medicine. Pregnancy  If you are about to stop having your period (premenopausal) and you may become pregnant, seek counseling before you get pregnant.  Take 400 to 800 micrograms (mcg) of folic acid every day if you become pregnant.  Ask for birth control (contraception) if you want to prevent pregnancy. Osteoporosis and menopause Osteoporosis is a disease in which the bones lose minerals and strength with aging. This can result in bone fractures. If you are 79 years old or older, or if you are at risk for osteoporosis and fractures, ask your health care provider if you should:  Be screened for bone loss.  Take a calcium or vitamin D supplement to lower your risk of fractures.  Be given hormone replacement therapy (HRT) to treat symptoms of menopause. Follow these instructions at home: Lifestyle  Do not use any products that contain nicotine or tobacco, such as cigarettes, e-cigarettes, and chewing tobacco. If you need help quitting, ask your health care provider.  Do not use street drugs.  Do not share needles.  Ask your health care provider for help if you need support or information about quitting drugs. Alcohol use  Do not drink alcohol if: ? Your health care provider tells you not to drink. ? You are pregnant, may be pregnant, or are planning to become pregnant.  If you drink alcohol: ? Limit how much you use to 0-1 drink a day. ? Limit intake if you are breastfeeding.  Be aware of how much alcohol is in your drink. In the [place] one drink equals one 12 oz bottle of beer (355 mL), one 5 oz glass of wine (148 mL), or one 1 oz glass of hard liquor  (44 mL). General instructions  Schedule regular health, dental, and eye exams.  Stay current with your vaccines.  Tell your health care provider if: ? You often feel depressed. ? You have ever been abused or do not feel safe at home. Summary  Adopting a healthy lifestyle and getting preventive care are important in promoting health and wellness.  Follow your health care provider's instructions about healthy diet, exercising, and getting tested or screened for diseases.  Follow your health care provider's instructions on monitoring your cholesterol and blood pressure. This information is not intended to replace advice given to you by your health care provider. Make sure you discuss any questions you have with your health care provider. Document Revised: 04/17/2018 Document Reviewed: 04/17/2018 Elsevier Patient Education  2020 [employer] M.D.

## 2019-09-24 ENCOUNTER — Encounter: Payer: Self-pay | Admitting: Internal Medicine

## 2019-09-24 ENCOUNTER — Other Ambulatory Visit: Payer: Self-pay

## 2019-09-24 ENCOUNTER — Ambulatory Visit (INDEPENDENT_AMBULATORY_CARE_PROVIDER_SITE_OTHER): Payer: Medicare HMO | Admitting: Internal Medicine

## 2019-09-24 VITALS — BP 128/68 | HR 78 | Temp 98.5°F | Ht 63.0 in | Wt 152.8 lb

## 2019-09-24 DIAGNOSIS — Z0000 Encounter for general adult medical examination without abnormal findings: Secondary | ICD-10-CM | POA: Diagnosis not present

## 2019-09-24 DIAGNOSIS — M170 Bilateral primary osteoarthritis of knee: Secondary | ICD-10-CM | POA: Diagnosis not present

## 2019-09-24 DIAGNOSIS — R12 Heartburn: Secondary | ICD-10-CM | POA: Diagnosis not present

## 2019-09-24 DIAGNOSIS — E785 Hyperlipidemia, unspecified: Secondary | ICD-10-CM | POA: Diagnosis not present

## 2019-09-24 DIAGNOSIS — R7301 Impaired fasting glucose: Secondary | ICD-10-CM

## 2019-09-24 DIAGNOSIS — R7989 Other specified abnormal findings of blood chemistry: Secondary | ICD-10-CM | POA: Diagnosis not present

## 2019-09-24 DIAGNOSIS — Z79899 Other long term (current) drug therapy: Secondary | ICD-10-CM | POA: Diagnosis not present

## 2019-09-24 DIAGNOSIS — I10 Essential (primary) hypertension: Secondary | ICD-10-CM

## 2019-09-24 LAB — LIPID PANEL
Cholesterol: 172 mg/dL (ref 0–200)
HDL: 53.9 mg/dL (ref >39.00)
LDL Cholesterol: 94 mg/dL (ref 0–99)
NonHDL: 118.43
Total CHOL/HDL Ratio: 3
Triglycerides: 122 mg/dL (ref 0.0–149.0)
VLDL: 24.4 mg/dL (ref 0.0–40.0)

## 2019-09-24 LAB — BASIC METABOLIC PANEL
BUN: 16 mg/dL (ref 6–23)
CO2: 28 mEq/L (ref 19–32)
Calcium: 9.5 mg/dL (ref 8.4–10.5)
Chloride: 103 mEq/L (ref 96–112)
Creatinine, Ser: 0.84 mg/dL (ref 0.40–1.20)
GFR: 67.06 mL/min (ref >60.00)
Glucose, Bld: 97 mg/dL (ref 70–99)
Potassium: 4.2 mEq/L (ref 3.5–5.1)
Sodium: 140 mEq/L (ref 135–145)

## 2019-09-24 LAB — CBC WITH DIFFERENTIAL/PLATELET
Basophils Absolute: 0 10*3/uL (ref 0.0–0.1)
Basophils Relative: 0.6 % (ref 0.0–3.0)
Eosinophils Absolute: 0.1 10*3/uL (ref 0.0–0.7)
Eosinophils Relative: 2.2 % (ref 0.0–5.0)
HCT: 39.5 % (ref 36.0–46.0)
Hemoglobin: 13.2 g/dL (ref 12.0–15.0)
Lymphocytes Relative: 23.4 % (ref 12.0–46.0)
Lymphs Abs: 1.6 10*3/uL (ref 0.7–4.0)
MCHC: 33.4 g/dL (ref 30.0–36.0)
MCV: 88.7 fl (ref 78.0–100.0)
Monocytes Absolute: 0.7 10*3/uL (ref 0.1–1.0)
Monocytes Relative: 9.7 % (ref 3.0–12.0)
Neutro Abs: 4.4 10*3/uL (ref 1.4–7.7)
Neutrophils Relative %: 64.1 % (ref 43.0–77.0)
Platelets: 268 10*3/uL (ref 150.0–400.0)
RBC: 4.46 Mil/uL (ref 3.87–5.11)
RDW: 14.4 % (ref 11.5–15.5)
WBC: 6.8 10*3/uL (ref 4.0–10.5)

## 2019-09-24 LAB — HEMOGLOBIN A1C: Hgb A1c MFr Bld: 5.9 % (ref 4.6–6.5)

## 2019-09-24 LAB — HEPATIC FUNCTION PANEL
ALT: 17 U/L (ref 0–35)
AST: 19 U/L (ref 0–37)
Albumin: 4.5 g/dL (ref 3.5–5.2)
Alkaline Phosphatase: 77 U/L (ref 39–117)
Bilirubin, Direct: 0.1 mg/dL (ref 0.0–0.3)
Total Bilirubin: 0.4 mg/dL (ref 0.2–1.2)
Total Protein: 6.6 g/dL (ref 6.0–8.3)

## 2019-09-24 LAB — TSH: TSH: 3.54 u[IU]/mL (ref 0.35–4.50)

## 2019-09-24 MED ORDER — OMEPRAZOLE 20 MG PO CPDR: 20.0000 mg | DELAYED_RELEASE_CAPSULE | Freq: Every day | ORAL | 2 refills | Status: DC

## 2019-09-24 NOTE — Progress Notes (Signed)
Blood work results are in normal range    Continue lifestyle intervention healthy eating and exercise . And medications

## 2019-09-24 NOTE — Patient Instructions (Signed)
Will notify you  of labs when available.  Have pharmacy and messaging  Ask for 90 days on  Continuous medications 2-3 # weight loss can help reflux sx    Health Maintenance, Female Adopting a healthy lifestyle and getting preventive care are important in promoting health and wellness. Ask your health care provider about:  The right schedule for you to have regular tests and exams.  Things you can do on your own to prevent diseases and keep yourself healthy. What should I know about diet, weight, and exercise? Eat a healthy diet   Eat a diet that includes plenty of vegetables, fruits, low-fat dairy products, and lean protein.  Do not eat a lot of foods that are high in solid fats, added sugars, or sodium. Maintain a healthy weight Body mass index (BMI) is used to identify weight problems. It estimates body fat based on height and weight. Your health care provider can help determine your BMI and help you achieve or maintain a healthy weight. Get regular exercise Get regular exercise. This is one of the most important things you can do for your health. Most adults should:  Exercise for at least 150 minutes each week. The exercise should increase your heart rate and make you sweat (moderate-intensity exercise).  Do strengthening exercises at least twice a week. This is in addition to the moderate-intensity exercise.  Spend less time sitting. Even light physical activity can be beneficial. Watch cholesterol and blood lipids Have your blood tested for lipids and cholesterol at 70 years of age, then have this test every 5 years. Have your cholesterol levels checked more often if:  Your lipid or cholesterol levels are high.  You are older than 70 years of age.  You are at high risk for heart disease. What should I know about cancer screening? Depending on your health history and family history, you may need to have cancer screening at various ages. This may include screening  for:  Breast cancer.  Cervical cancer.  Colorectal cancer.  Skin cancer.  Lung cancer. What should I know about heart disease, diabetes, and high blood pressure? Blood pressure and heart disease  High blood pressure causes heart disease and increases the risk of stroke. This is more likely to develop in people who have high blood pressure readings, are of African descent, or are overweight.  Have your blood pressure checked: ? Every 3-5 years if you are 58-36 years of age. ? Every year if you are 97 years old or older. Diabetes Have regular diabetes screenings. This checks your fasting blood sugar level. Have the screening done:  Once every three years after age 66 if you are at a normal weight and have a low risk for diabetes.  More often and at a younger age if you are overweight or have a high risk for diabetes. What should I know about preventing infection? Hepatitis B If you have a higher risk for hepatitis B, you should be screened for this virus. Talk with your health care provider to find out if you are at risk for hepatitis B infection. Hepatitis C Testing is recommended for:  Everyone born from [birth year] through 1966.  Anyone with known risk factors for hepatitis C. Sexually transmitted infections (STIs)  Get screened for STIs, including gonorrhea and chlamydia, if: ? You are sexually active and are younger than 70 years of age. ? You are older than 70 years of age and your health care provider tells you that you are at  risk for this type of infection. ? Your sexual activity has changed since you were last screened, and you are at increased risk for chlamydia or gonorrhea. Ask your health care provider if you are at risk.  Ask your health care provider about whether you are at high risk for HIV. Your health care provider may recommend a prescription medicine to help prevent HIV infection. If you choose to take medicine to prevent HIV, you should first get tested for HIV.  You should then be tested every 3 months for as long as you are taking the medicine. Pregnancy  If you are about to stop having your period (premenopausal) and you may become pregnant, seek counseling before you get pregnant.  Take 400 to 800 micrograms (mcg) of folic acid every day if you become pregnant.  Ask for birth control (contraception) if you want to prevent pregnancy. Osteoporosis and menopause Osteoporosis is a disease in which the bones lose minerals and strength with aging. This can result in bone fractures. If you are 65 years old or older, or if you are at risk for osteoporosis and fractures, ask your health care provider if you should:  Be screened for bone loss.  Take a calcium or vitamin D supplement to lower your risk of fractures.  Be given hormone replacement therapy (HRT) to treat symptoms of menopause. Follow these instructions at home: Lifestyle  Do not use any products that contain nicotine or tobacco, such as cigarettes, e-cigarettes, and chewing tobacco. If you need help quitting, ask your health care provider.  Do not use street drugs.  Do not share needles.  Ask your health care provider for help if you need support or information about quitting drugs. Alcohol use  Do not drink alcohol if: ? Your health care provider tells you not to drink. ? You are pregnant, may be pregnant, or are planning to become pregnant.  If you drink alcohol: ? Limit how much you use to 0-1 drink a day. ? Limit intake if you are breastfeeding.  Be aware of how much alcohol is in your drink. In the [place] one drink equals one 12 oz bottle of beer (355 mL), one 5 oz glass of wine (148 mL), or one 1 oz glass of hard liquor (44 mL). General instructions  Schedule regular health, dental, and eye exams.  Stay current with your vaccines.  Tell your health care provider if: ? You often feel depressed. ? You have ever been abused or do not feel safe at  home. Summary  Adopting a healthy lifestyle and getting preventive care are important in promoting health and wellness.  Follow your health care provider's instructions about healthy diet, exercising, and getting tested or screened for diseases.  Follow your health care provider's instructions on monitoring your cholesterol and blood pressure. This information is not intended to replace advice given to you by your health care provider. Make sure you discuss any questions you have with your health care provider. Document Revised: 04/17/2018 Document Reviewed: 04/17/2018 Elsevier Patient Education  2020 [employer].

## 2019-10-02 ENCOUNTER — Encounter: Payer: Self-pay | Admitting: Adult Health

## 2019-10-02 ENCOUNTER — Telehealth (INDEPENDENT_AMBULATORY_CARE_PROVIDER_SITE_OTHER): Payer: Medicare HMO | Admitting: Adult Health

## 2019-10-02 VITALS — BP 139/63 | HR 82 | Ht 63.0 in | Wt 150.0 lb

## 2019-10-02 DIAGNOSIS — E7800 Pure hypercholesterolemia, unspecified: Secondary | ICD-10-CM

## 2019-10-02 DIAGNOSIS — R0789 Other chest pain: Secondary | ICD-10-CM

## 2019-10-02 DIAGNOSIS — I10 Essential (primary) hypertension: Secondary | ICD-10-CM

## 2019-10-02 NOTE — Progress Notes (Signed)
Virtual Visit via Telephone Note   This visit type was conducted due to national recommendations for restrictions regarding the COVID-19 Pandemic (e.g. social distancing) in an effort to limit this patient's exposure and mitigate transmission in our community.  Due to her co-morbid illnesses, this patient is at least at moderate risk for complications without adequate follow up.  This format is felt to be most appropriate for this patient at this time.  The patient did not have access to video technology/had technical difficulties with video requiring transitioning to audio format only (telephone).  All issues noted in this document were discussed and addressed.  No physical exam could be performed with this format.  Please refer to the patient's chart for her  consent to telehealth for [facility].   Date:  10/02/2019   ID:  Jacqueline Myers, DOB 07/29/1950, MRN [id number]  Patient Location: Home Provider Location: Home  PCP:  Burnis Medin, MD  Cardiologist:  Skeet Latch, MD  Electrophysiologist:  None   Evaluation Performed:  Follow-Up Visit  Chief Complaint:  Follow Up  History of Present Illness:    Jacqueline Myers is a 69 y.o. female who presents for ongoing assessment and management of mitral valve prolapse, hyperlipidemia, hypertension, with history of GERD and esophageal stricture along with anxiety.  She was last seen in the office by me on 03/03/2019, she was complaining of recurrent chest pain and dyspnea on exertion along with worsening fatigue.  She was caring for her 26 year old mother who had become an invalid and required significant amount of care to feed baby and assist with general overall care.  This was very stressful for her.  She had also been newly diagnosed with hypothyroidism and been started on medication levothyroxine 25 mcg daily by her primary care physician.    Most recent nuclear medicine study was on 03/01/2016 and was found to be  normal.  However, due to her recurrent symptoms, I repeated her stress Myoview for evaluation for ischemia and also echocardiogram was ordered to evaluate for changes in LV function.  Repeat stress test on 03/06/2019 revealed normal with no evidence of ischemia.  Echocardiogram 03/10/2019 revealed that she had normal LVEF of 60% to 65%.  She did have some mild left ventricular hypertrophy and grade 1 diastolic dysfunction.  There were no valvular abnormalities.  I am seeing her today via telephone visit and she is without complaints.  She is followed by Dr. Regis Bill, has had labs last week and a wellness visit.  All were within normal limits.  She is without any cardiac complaints concerning chest pain, shortness of breath, palpitations, fatigue, dizziness, or intolerance to medications.  Dr. Regis Bill is providing refills.  The patient does not have symptoms concerning for COVID-19 infection (fever, chills, cough, or new shortness of breath). She has received her COVID vaccines in February 2021.     Past Medical History:  Diagnosis Date  . Colon polyps    hyperplastic  . Diverticulosis   . Esophageal stricture   . GERD (gastroesophageal reflux disease)   . History of bronchitis   . Hyperlipidemia   . Hypertension   . Mitral valve prolapse   . Osteoarthritis   . Osteoporosis   . Spinal stenosis    Past Surgical History:  Procedure Laterality Date  . COLONOSCOPY    . NM MYOVIEW LTD  08/2006   normal for abn ekg  . TONSILLECTOMY    . TOTAL KNEE ARTHROPLASTY Left 04/14/2015  Procedure: LEFT TOTAL KNEE ARTHROPLASTY;  Surgeon: Gaynelle Arabian, MD;  Location: [facility];  Service: Orthopedics;  Laterality: Left;     Current Meds  Medication Sig  . acetaminophen (TYLENOL) 500 MG tablet Take 500-1,000 mg by mouth every 6 (six) hours as needed for moderate pain.  . Candesartan Cilexetil-HCTZ 32-25 MG TABS Take 1 tablet by mouth once daily  . citalopram (CELEXA) 10 MG tablet TAKE 1 & 1/2 (ONE &  ONE-HALF) TABLETS BY MOUTH ONCE DAILY  . cyclobenzaprine (FLEXERIL) 10 MG tablet TAKE 1/2 TO 1 (ONE-HALF TO ONE) TABLET BY MOUTH THREE TIMES DAILY AS NEEDED FOR MUSCLE SPASM  . diclofenac sodium (VOLTAREN) 1 % GEL Apply 4 g topically 4 (four) times daily.  Marland Kitchen levothyroxine (SYNTHROID) 50 MCG tablet Take 1/2 (one-half) tablet by mouth once daily  . LORazepam (ATIVAN) 1 MG tablet Take 0.5-1 tablets (0.5-1 mg total) by mouth every 8 (eight) hours as needed for anxiety.  . Omega-3 Fatty Acids (FISH OIL) 1200 MG CPDR Take 2 capsules by mouth daily.  Marland Kitchen omeprazole (PRILOSEC) 20 MG capsule Take 1 capsule (20 mg total) by mouth daily.  . rosuvastatin (CRESTOR) 5 MG tablet Take 1 tablet by mouth once daily  . valACYclovir (VALTREX) 1000 MG tablet Take 1 tablet (1,000 mg total) by mouth 3 (three) times daily. As directed     Allergies:   Morphine and related, Other, Statins, Lisinopril, Oxycodone, and Simvastatin   Social History   Tobacco Use  . Smoking status: Former Smoker    Packs/day: 1.00    Years: 29.00    Pack years: 29.00    Types: Cigarettes    Quit date: 05/09/1999    Years since quitting: 20.4  . Smokeless tobacco: Never Used  Substance Use Topics  . Alcohol use: Yes    Alcohol/week: 0.0 standard drinks    Comment: OCC- [place] [employer]  . Drug use: No     Family Hx: The patient's family history includes Alcohol abuse in her mother; Breast cancer in her sister; Diabetes in her mother; Hyperlipidemia in her father and mother; Hypertension in her mother; Stroke in her mother. There is no history of Colon cancer.  ROS:   Please see the history of present illness.    All other systems reviewed and are negative.   Prior CV studies:   The following studies were reviewed today: Echocardiogram 03/10/2019 1. Left ventricular ejection fraction, by visual estimation, is 60 to 65%. The left ventricle has normal function. There is moderately increased left ventricular hypertrophy. 2. Left  ventricular diastolic parameters are consistent with Grade I diastolic dysfunction (impaired relaxation). 3. Global right ventricle has normal systolic function.The right ventricular size is normal. No increase in right ventricular wall thickness. 4. Left atrial size was normal. 5. Right atrial size was normal. 6. The mitral valve is grossly normal. Trace mitral valve regurgitation. 7. No diagnostic mitral valve prolapse. 8. The tricuspid valve is grossly normal. Tricuspid valve regurgitation is trivial. 9. The aortic valve is tricuspid. Aortic valve regurgitation is not visualized. 10. The pulmonic valve was grossly normal. Pulmonic valve regurgitation is not visualized. 11. The inferior vena cava is normal in size with greater than 50% respiratory variability, suggesting right atrial pressure of 3 mmHg.  Study Highlights    The left ventricular ejection fraction is hyperdynamic (>65%).  Nuclear stress EF: 79%.  There was no ST segment deviation noted during stress.  The study is normal.  This is a low risk study.  Normal stress nuclear study with no ischemia or infarction. Gated ejection fraction 79% with normal wall motion.     Labs/Other Tests and Data Reviewed:    EKG:  Not completed today due to virtual visit.  Recent Labs: 09/24/2019: ALT 17; BUN 16; Creatinine, Ser 0.84; Hemoglobin 13.2; Platelets 268.0; Potassium 4.2; Sodium 140; TSH 3.54   Recent Lipid Panel Lab Results  Component Value Date/Time   CHOL 172 09/24/2019 11:05 AM   TRIG 122.0 09/24/2019 11:05 AM   TRIG 145 04/06/2006 08:08 AM   HDL 53.90 09/24/2019 11:05 AM   CHOLHDL 3 09/24/2019 11:05 AM   LDLCALC 94 09/24/2019 11:05 AM   LDLDIRECT 127.6 06/23/2013 08:07 AM    Wt Readings from Last 3 Encounters:  10/02/19 150 lb (68 kg)  09/24/19 152 lb 12.8 oz (69.3 kg)  04/02/19 156 lb 6.4 oz (70.9 kg)     Objective:    Vital Signs:  BP 139/63   Pulse 82   Ht 5\' 3"  (1.6 m)   Wt 150 lb  (68 kg)   BMI 26.57 kg/m    VITAL SIGNS:  reviewed GEN:  no acute distress RESPIRATORY:  normal respiratory effort, symmetric expansion NEURO:  alert and oriented x 3, no obvious focal deficit PSYCH:  normal affect  ASSESSMENT & PLAN:    1. Mitral Valve prolapse:   She is asymptomatic with this at this time.  No shortness of breath or chest discomfort.  We will repeat echocardiogram on follow-up visit.  2.  Hypertension: Excellent control of blood pressure today.  She is followed by Dr. Regis Bill who is refilling her medications.  She just had a wellness visit last week.  No changes are planned on her medication regimen.  3.  History of chest discomfort.  No further complaints.  She remains active.  Nuclear medicine study in 2020 was negative for ischemia.  Echocardiogram was normal with exception of some mild left ventricular hypertrophy.  We will follow up with another echo as above for mitral valve prolapse.  4.  Hyperlipidemia: Recent labs revealed LDL 96.  Would like to keep it below 100.  She continues on statin therapy.  Labs are monitored by PCP.  COVID-19 Education: The signs and symptoms of COVID-19 were discussed with the patient and how to seek care for testing (follow up with PCP or arrange E-visit). The importance of social distancing was discussed today. She has had both of her vaccines.   Time:   Today, I have spent 20 minutes with the patient with telehealth technology discussing the above problems, reviewing records and labs in preparation for visit. .     Medication Adjustments/Labs and Tests Ordered: Current medicines are reviewed at length with the patient today.  Concerns regarding medicines are outlined above.   Tests Ordered: No orders of the defined types were placed in this encounter.   Medication Changes: No orders of the defined types were placed in this encounter.   Disposition:  Follow up one year.  Signed, Phill Myron. West Pugh, ANP,  AACC  10/02/2019 9:51 AM     [facility]

## 2019-10-02 NOTE — Patient Instructions (Signed)
Medication Instructions:  Continue current medications  *If you need a refill on your cardiac medications before your next appointment, please call your pharmacy*   Lab Work: None Ordered   Testing/Procedures: None Ordered   Follow-Up: At [facility], you and your health needs are our priority.  As part of our continuing mission to provide you with exceptional heart care, we have created designated Provider Care Teams.  These Care Teams include your primary Cardiologist (physician) and Advanced Practice Providers (APPs -  Physician Assistants and Nurse Practitioners) who all work together to provide you with the care you need, when you need it.  We recommend signing up for the patient portal called "MyChart".  Sign up information is provided on this After Visit Summary.  MyChart is used to connect with patients for Virtual Visits (Telemedicine).  Patients are able to view lab/test results, encounter notes, upcoming appointments, etc.  Non-urgent messages can be sent to your provider as well.   To learn more about what you can do with MyChart, go to [email or web address].    Your next appointment:   1 year(s)  The format for your next appointment:   In Person  Provider:   You may see Tiffany Baker, MD or one of the following Advanced Practice Providers on your designated Care Team:    Luke Kilroy, PA-C  Callie Goodrich, PA-C  Jesse Cleaver, FNP    

## 2019-10-28 ENCOUNTER — Other Ambulatory Visit: Payer: Self-pay | Admitting: Internal Medicine

## 2019-10-29 ENCOUNTER — Other Ambulatory Visit: Payer: Self-pay | Admitting: Internal Medicine

## 2019-11-05 DIAGNOSIS — Z961 Presence of intraocular lens: Secondary | ICD-10-CM | POA: Diagnosis not present

## 2019-11-05 DIAGNOSIS — H52203 Unspecified astigmatism, bilateral: Secondary | ICD-10-CM | POA: Diagnosis not present

## 2019-11-05 DIAGNOSIS — H524 Presbyopia: Secondary | ICD-10-CM | POA: Diagnosis not present

## 2019-11-14 ENCOUNTER — Other Ambulatory Visit: Payer: Self-pay | Admitting: Internal Medicine

## 2019-11-17 NOTE — Telephone Encounter (Signed)
Last OV 09/24/2019  Last filled 02/06/2019, # 30 with 0 refills  OK to continue to fill?

## 2019-11-24 ENCOUNTER — Other Ambulatory Visit: Payer: Self-pay | Admitting: Internal Medicine

## 2019-12-05 ENCOUNTER — Other Ambulatory Visit: Payer: Self-pay

## 2019-12-05 ENCOUNTER — Ambulatory Visit (INDEPENDENT_AMBULATORY_CARE_PROVIDER_SITE_OTHER): Payer: Medicare HMO

## 2019-12-05 DIAGNOSIS — Z780 Asymptomatic menopausal state: Secondary | ICD-10-CM | POA: Diagnosis not present

## 2019-12-05 DIAGNOSIS — Z0000 Encounter for general adult medical examination without abnormal findings: Secondary | ICD-10-CM

## 2019-12-05 NOTE — Patient Instructions (Signed)
Jacqueline Myers , Thank you for taking time to come for your Medicare Wellness Visit. I appreciate your ongoing commitment to your health goals. Please review the following plan we discussed and let me know if I can assist you in the future.   Screening recommendations/referrals: Colonoscopy: Up to date, next due 01/17/2023 Mammogram: Up to date, next due 07/27/2020 Bone Density: Currently due, orders placed this visit Recommended yearly ophthalmology/optometry visit for glaucoma screening and checkup Recommended yearly dental visit for hygiene and checkup  Vaccinations: Influenza vaccine: Up to date, next due 12/2019 Pneumococcal vaccine: Completed series Tdap vaccine: Up to date, next due 07/03/2024 Shingles vaccine: Currently due, please check with your pharmacist in regards to cost and to receive vaccines     Advanced directives: Copies on file  Conditions/risks identified: None  Next appointment: None    Preventive Care 70 Years and Older, Female Preventive care refers to lifestyle choices and visits with your health care provider that can promote health and wellness. What does preventive care include?  A yearly physical exam. This is also called an annual well check.  Dental exams once or twice a year.  Routine eye exams. Ask your health care provider how often you should have your eyes checked.  Personal lifestyle choices, including:  Daily care of your teeth and gums.  Regular physical activity.  Eating a healthy diet.  Avoiding tobacco and drug use.  Limiting alcohol use.  Practicing safe sex.  Taking low-dose aspirin every day.  Taking vitamin and mineral supplements as recommended by your health care provider. What happens during an annual well check? The services and screenings done by your health care provider during your annual well check will depend on your age, overall health, lifestyle risk factors, and family history of disease. Counseling  Your  health care provider may ask you questions about your:  Alcohol use.  Tobacco use.  Drug use.  Emotional well-being.  Home and relationship well-being.  Sexual activity.  Eating habits.  History of falls.  Memory and ability to understand (cognition).  Work and work Statistician.  Reproductive health. Screening  You may have the following tests or measurements:  Height, weight, and BMI.  Blood pressure.  Lipid and cholesterol levels. These may be checked every 5 years, or more frequently if you are over 27 years old.  Skin check.  Lung cancer screening. You may have this screening every year starting at age 76 if you have a 30-pack-year history of smoking and currently smoke or have quit within the past 15 years.  Fecal occult blood test (FOBT) of the stool. You may have this test every year starting at age 50.  Flexible sigmoidoscopy or colonoscopy. You may have a sigmoidoscopy every 5 years or a colonoscopy every 10 years starting at age 25.  Hepatitis C blood test.  Hepatitis B blood test.  Sexually transmitted disease (STD) testing.  Diabetes screening. This is done by checking your blood sugar (glucose) after you have not eaten for a while (fasting). You may have this done every 1-3 years.  Bone density scan. This is done to screen for osteoporosis. You may have this done starting at age 67.  Mammogram. This may be done every 1-2 years. Talk to your health care provider about how often you should have regular mammograms. Talk with your health care provider about your test results, treatment options, and if necessary, the need for more tests. Vaccines  Your health care provider may recommend certain vaccines, such  as:  Influenza vaccine. This is recommended every year.  Tetanus, diphtheria, and acellular pertussis (Tdap, Td) vaccine. You may need a Td booster every 10 years.  Zoster vaccine. You may need this after age 63.  Pneumococcal 13-valent  conjugate (PCV13) vaccine. One dose is recommended after age 43.  Pneumococcal polysaccharide (PPSV23) vaccine. One dose is recommended after age 54. Talk to your health care provider about which screenings and vaccines you need and how often you need them. This information is not intended to replace advice given to you by your health care provider. Make sure you discuss any questions you have with your health care provider. Document Released: 05/21/2015 Document Revised: 01/12/2016 Document Reviewed: 02/23/2015 Elsevier Interactive Patient Education  2017 [employer] Prevention in the Home Falls can cause injuries. They can happen to people of all ages. There are many things you can do to make your home safe and to help prevent falls. What can I do on the outside of my home?  Regularly fix the edges of walkways and driveways and fix any cracks.  Remove anything that might make you trip as you walk through a door, such as a raised step or threshold.  Trim any bushes or trees on the path to your home.  Use bright outdoor lighting.  Clear any walking paths of anything that might make someone trip, such as rocks or tools.  Regularly check to see if handrails are loose or broken. Make sure that both sides of any steps have handrails.  Any raised decks and porches should have guardrails on the edges.  Have any leaves, snow, or ice cleared regularly.  Use sand or salt on walking paths during winter.  Clean up any spills in your garage right away. This includes oil or grease spills. What can I do in the bathroom?  Use night lights.  Install grab bars by the toilet and in the tub and shower. Do not use towel bars as grab bars.  Use non-skid mats or decals in the tub or shower.  If you need to sit down in the shower, use a plastic, non-slip stool.  Keep the floor dry. Clean up any water that spills on the floor as soon as it happens.  Remove soap buildup in the tub or  shower regularly.  Attach bath mats securely with double-sided non-slip rug tape.  Do not have throw rugs and other things on the floor that can make you trip. What can I do in the bedroom?  Use night lights.  Make sure that you have a light by your bed that is easy to reach.  Do not use any sheets or blankets that are too big for your bed. They should not hang down onto the floor.  Have a firm chair that has side arms. You can use this for support while you get dressed.  Do not have throw rugs and other things on the floor that can make you trip. What can I do in the kitchen?  Clean up any spills right away.  Avoid walking on wet floors.  Keep items that you use a lot in easy-to-reach places.  If you need to reach something above you, use a strong step stool that has a grab bar.  Keep electrical cords out of the way.  Do not use floor polish or wax that makes floors slippery. If you must use wax, use non-skid floor wax.  Do not have throw rugs and other things on the  floor that can make you trip. What can I do with my stairs?  Do not leave any items on the stairs.  Make sure that there are handrails on both sides of the stairs and use them. Fix handrails that are broken or loose. Make sure that handrails are as long as the stairways.  Check any carpeting to make sure that it is firmly attached to the stairs. Fix any carpet that is loose or worn.  Avoid having throw rugs at the top or bottom of the stairs. If you do have throw rugs, attach them to the floor with carpet tape.  Make sure that you have a light switch at the top of the stairs and the bottom of the stairs. If you do not have them, ask someone to add them for you. What else can I do to help prevent falls?  Wear shoes that:  Do not have high heels.  Have rubber bottoms.  Are comfortable and fit you well.  Are closed at the toe. Do not wear sandals.  If you use a stepladder:  Make sure that it is fully  opened. Do not climb a closed stepladder.  Make sure that both sides of the stepladder are locked into place.  Ask someone to hold it for you, if possible.  Clearly mark and make sure that you can see:  Any grab bars or handrails.  First and last steps.  Where the edge of each step is.  Use tools that help you move around (mobility aids) if they are needed. These include:  Canes.  Walkers.  Scooters.  Crutches.  Turn on the lights when you go into a dark area. Replace any light bulbs as soon as they burn out.  Set up your furniture so you have a clear path. Avoid moving your furniture around.  If any of your floors are uneven, fix them.  If there are any pets around you, be aware of where they are.  Review your medicines with your doctor. Some medicines can make you feel dizzy. This can increase your chance of falling. Ask your doctor what other things that you can do to help prevent falls. This information is not intended to replace advice given to you by your health care provider. Make sure you discuss any questions you have with your health care provider. Document Released: 02/18/2009 Document Revised: 09/30/2015 Document Reviewed: 05/29/2014 Elsevier Interactive Patient Education  2017 [employer].

## 2019-12-05 NOTE — Progress Notes (Signed)
Subjective:   Jacqueline Myers is a 69 y.o. female who presents for Medicare Annual (Subsequent) preventive examination.  I connected with [name] today by telephone and verified that I am speaking with the correct person using two identifiers. Location patient: home Location provider: work Persons participating in the virtual visit: patient, provider.   I discussed the limitations, risks, security and privacy concerns of performing an evaluation and management service by telephone and the availability of in person appointments. I also discussed with the patient that there may be a patient responsible charge related to this service. The patient expressed understanding and verbally consented to this telephonic visit.    Interactive audio and video telecommunications were attempted between this provider and patient, however failed, due to patient having technical difficulties OR patient did not have access to video capability.  We continued and completed visit with audio only.      Review of Systems    N/A Cardiac Risk Factors include: dyslipidemia;advanced age (>82men, >50 women);hypertension     Objective:    Today's Vitals   There is no height or weight on file to calculate BMI.  Advanced Directives 12/05/2019 04/14/2015 04/14/2015 04/08/2015  Does Patient Have a Medical Advance Directive? Yes Yes Yes No  Type of Paramedic of [place];Living will [place];Living will [place];Living will -  Does patient want to make changes to medical advance directive? No - Patient declined - - -  Copy of [place] in Chart? Yes - validated most recent copy scanned in chart (See row information) Yes Yes -  Would patient like information on creating a medical advance directive? - No - patient declined information No - patient declined information Yes - Educational materials given    Current  Medications (verified) Outpatient Encounter Medications as of 12/05/2019  Medication Sig  . acetaminophen (TYLENOL) 500 MG tablet Take 500-1,000 mg by mouth every 6 (six) hours as needed for moderate pain.  . Candesartan Cilexetil-HCTZ 32-25 MG TABS Take 1 tablet by mouth once daily  . citalopram (CELEXA) 10 MG tablet TAKE 1 & 1/2 (ONE & ONE-HALF) TABLETS BY MOUTH ONCE DAILY  . cyclobenzaprine (FLEXERIL) 10 MG tablet TAKE 1/2 TO 1 (ONE-HALF TO ONE) TABLET BY MOUTH THREE TIMES DAILY AS NEEDED FOR MUSCLE SPASM  . diclofenac sodium (VOLTAREN) 1 % GEL Apply 4 g topically 4 (four) times daily.  Marland Kitchen levothyroxine (SYNTHROID) 50 MCG tablet Take 1/2 (one-half) tablet by mouth once daily  . Omega-3 Fatty Acids (FISH OIL) 1200 MG CPDR Take 2 capsules by mouth daily.  Marland Kitchen omeprazole (PRILOSEC) 20 MG capsule Take 1 capsule (20 mg total) by mouth daily.  . rosuvastatin (CRESTOR) 5 MG tablet Take 1 tablet by mouth once daily  . valACYclovir (VALTREX) 1000 MG tablet Take 1 tablet (1,000 mg total) by mouth 3 (three) times daily. As directed  . LORazepam (ATIVAN) 1 MG tablet Take 0.5-1 tablets (0.5-1 mg total) by mouth every 8 (eight) hours as needed for anxiety. (Patient not taking: Reported on 12/05/2019)   No facility-administered encounter medications on file as of 12/05/2019.    Allergies (verified) Morphine and related, Other, Statins, Lisinopril, Oxycodone, and Simvastatin   History: Past Medical History:  Diagnosis Date  . Colon polyps    hyperplastic  . Diverticulosis   . Esophageal stricture   . GERD (gastroesophageal reflux disease)   . History of bronchitis   . Hyperlipidemia   . Hypertension   .  Mitral valve prolapse   . Osteoarthritis   . Osteoporosis   . Spinal stenosis    Past Surgical History:  Procedure Laterality Date  . COLONOSCOPY    . NM MYOVIEW LTD  08/2006   normal for abn ekg  . TONSILLECTOMY    . TOTAL KNEE ARTHROPLASTY Left 04/14/2015   Procedure: LEFT TOTAL KNEE  ARTHROPLASTY;  Surgeon: Gaynelle Arabian, MD;  Location: [facility];  Service: Orthopedics;  Laterality: Left;   Family History  Problem Relation Age of Onset  . Breast cancer Sister   . Alcohol abuse Mother   . Stroke Mother   . Hypertension Mother   . Diabetes Mother   . Hyperlipidemia Mother   . Hyperlipidemia Father   . Colon cancer Neg Hx    Social History   Socioeconomic History  . Marital status: Married    Spouse name: Not on file  . Number of children: 2  . Years of education: Not on file  . Highest education level: Not on file  Occupational History  . Occupation: Retired  Tobacco Use  . Smoking status: Former Smoker    Packs/day: 1.00    Years: 29.00    Pack years: 29.00    Types: Cigarettes    Quit date: 05/09/1999    Years since quitting: 20.5  . Smokeless tobacco: Never Used  Vaping Use  . Vaping Use: Never used  Substance and Sexual Activity  . Alcohol use: Yes    Alcohol/week: 0.0 standard drinks    Comment: OCC- GLASS OF WINE  . Drug use: No  . Sexual activity: Not on file  Other Topics Concern  . 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   Neg td neg ets   Social Determinants of Health   Financial Resource Strain: Low Risk   . Difficulty of Paying Living Expenses: Not hard at all  Food Insecurity: No Food Insecurity  . Worried About Charity fundraiser in the Last Year: Never true  . Ran Out of Food in the Last Year: Never true  Transportation Needs: No Transportation Needs  . Lack of Transportation (Medical): No  . Lack of Transportation (Non-Medical): No  Physical Activity: Inactive  . Days of Exercise per Week: 0 days  . Minutes of Exercise per Session: 0 min  Stress: No Stress Concern Present  . Feeling of Stress : Not at all  Social Connections:  Moderately Integrated  . Frequency of Communication with Friends and Family: Never  . Frequency of Social Gatherings with Friends and Family: More than three times a week  . Attends Religious Services: More than 4 times per year  . Active Member of Clubs or Organizations: No  . Attends Archivist Meetings: Never  . Marital Status: Married    Tobacco Counseling Counseling given: Not Answered   Clinical Intake:  Pre-visit preparation completed: Yes  Pain : No/denies pain     Nutritional Risks: None Diabetes: No  How often do you need to have someone help you when you read instructions, pamphlets, or other written materials from your doctor or pharmacy?: 1 - Never What is the last grade level you completed in school?: Masters Degree  Diabetic?No  Interpreter Needed?: No  Information entered by ::  SCrews,LPN   Activities of Daily Living In your present state of health, do you have any difficulty performing the following activities: 12/05/2019  Hearing? N  Vision? N  Difficulty concentrating or making decisions? N  Walking or climbing stairs? N  Dressing or bathing? N  Doing errands, shopping? N  Preparing Food and eating ? N  Using the Toilet? N  In the past six months, have you accidently leaked urine? N  Do you have problems with loss of bowel control? N  Managing your Medications? N  Managing your Finances? N  Housekeeping or managing your Housekeeping? N  Some recent data might be hidden    Patient Care Team: Panosh, Standley Brooking, MD as PCP - General Skeet Latch, MD as PCP - Cardiology (Cardiology) Irene Shipper, MD (Gastroenterology) Gaynelle Arabian, MD as Consulting Physician (Orthopedic Surgery) Susa Day, MD as Consulting Physician (Orthopedic Surgery)  Indicate any recent Medical Services you may have received from other than [facility] providers in the past year (date may be approximate).     Assessment:   This is a routine wellness  examination for [place].  Hearing/Vision screen  Hearing Screening   125Hz  250Hz  500Hz  1000Hz  2000Hz  3000Hz  4000Hz  6000Hz  8000Hz   Right ear:           Left ear:           Vision Screening Comments: Patient states that she gets her eye checked annually    Dietary issues and exercise activities discussed: Current Exercise Habits: The patient does not participate in regular exercise at present, Exercise limited by: orthopedic condition(s)  Goals    . Patient Stated     I will continue to take my medications as prescribed.      Depression Screen PHQ 2/9 Scores 12/05/2019 09/24/2019 06/21/2017 06/20/2016 06/28/2015 06/28/2015 06/30/2013  PHQ - 2 Score 0 0 0 0 0 0 0  PHQ- 9 Score 0 0 - - - - -    Fall Risk Fall Risk  12/05/2019 06/21/2017 06/20/2016 06/28/2015 06/28/2015  Falls in the past year? 0 No No No No  Number falls in past yr: 0 - - - -  Injury with Fall? 0 - - - -  Risk for fall due to : Orthopedic patient;Medication side effect - - - -  Follow up Falls prevention discussed;Falls evaluation completed - - - -    Any stairs in or around the home? Yes  If so, are there any without handrails? No  Home free of loose throw rugs in walkways, pet beds, electrical cords, etc? Yes  Adequate lighting in your home to reduce risk of falls? Yes   ASSISTIVE DEVICES UTILIZED TO PREVENT FALLS:  Life alert? No  Use of a cane, walker or w/c? No  Grab bars in the bathroom? No  Shower chair or bench in shower? Yes  Elevated toilet seat or a handicapped toilet? No       Cognitive Function:     6CIT Screen 12/05/2019  What Year? 0 points  What month? 0 points  What time? 0 points  Count back from 20 0 points  Months in reverse 0 points  Repeat phrase 0 points  Total Score 0    Immunizations Immunization History  Administered Date(s) Administered  . Fluad Quad(high Dose 65+) 01/15/2019  . Influenza Split 04/20/2011, 03/08/2012, 03/12/2013  . Influenza Whole 04/28/2009, 05/04/2010    . Influenza, High Dose Seasonal PF 02/08/2015, 01/13/2016, 01/30/2017, 01/24/2018  . Influenza,inj,Quad PF,6+ Mos 12/29/2013  .  Influenza,inj,quad, With Preservative 01/06/2017  . PFIZER SARS-COV-2 Vaccination 06/16/2019, 07/11/2019  . Pneumococcal Conjugate-13 06/28/2015  . Pneumococcal Polysaccharide-23 06/20/2016  . Pneumococcal-Unspecified 05/09/2016  . Td 05/08/2004  . Tdap 07/03/2014  . Zoster 06/28/2011    TDAP status: Up to date Flu Vaccine status: Up to date Pneumococcal vaccine status: Up to date Covid-19 vaccine status: Completed vaccines  Qualifies for Shingles Vaccine? Yes   Zostavax completed Yes   Shingrix Completed?: No.    Education has been provided regarding the importance of this vaccine. Patient has been advised to call insurance company to determine out of pocket expense if they have not yet received this vaccine. Advised may also receive vaccine at local pharmacy or Health Dept. Verbalized acceptance and understanding.  Screening Tests Health Maintenance  Topic Date Due  . INFLUENZA VACCINE  12/07/2019  . MAMMOGRAM  07/27/2021  . COLONOSCOPY  01/17/2023  . TETANUS/TDAP  07/03/2024  . DEXA SCAN  Completed  . COVID-19 Vaccine  Completed  . Hepatitis C Screening  Completed  . PNA vac Low Risk Adult  Completed    Health Maintenance  There are no preventive care reminders to display for this patient.  Colorectal cancer screening: Completed 01/16/2013. Repeat every 10 years Mammogram status: Completed 07/28/2019. Repeat every year Bone Density status: Ordered 12/05/2019. Pt provided with contact info and advised to call to schedule appt.  Lung Cancer Screening: (Low Dose CT Chest recommended if Age 33-80 years, 30 pack-year currently smoking OR have quit w/in 15years.) does not qualify.   Lung Cancer Screening Referral: N/A  Additional Screening:  Hepatitis C Screening: does qualify; Completed 06/10/2016  Vision Screening: Recommended annual  ophthalmology exams for early detection of glaucoma and other disorders of the eye. Is the patient up to date with their annual eye exam?  Yes  Who is the provider or what is the name of the office in which the patient attends annual eye exams? Dr. Gershon Crane  If pt is not established with a provider, would they like to be referred to a provider to establish care? No .   Dental Screening: Recommended annual dental exams for proper oral hygiene  Community Resource Referral / Chronic Care Management: CRR required this visit?  No   CCM required this visit?  No      Plan:     I have personally reviewed and noted the following in the patient's chart:   . Medical and social history . Use of alcohol, tobacco or illicit drugs  . Current medications and supplements . Functional ability and status . Nutritional status . Physical activity . Advanced directives . List of other physicians . Hospitalizations, surgeries, and ER visits in previous 12 months . Vitals . Screenings to include cognitive, depression, and falls . Referrals and appointments  In addition, I have reviewed and discussed with patient certain preventive protocols, quality metrics, and best practice recommendations. A written personalized care plan for preventive services as well as general preventive health recommendations were provided to patient.     Ofilia Neas, LPN   9/37/1696   Nurse Notes: None

## 2019-12-11 DIAGNOSIS — R69 Illness, unspecified: Secondary | ICD-10-CM | POA: Diagnosis not present

## 2019-12-31 NOTE — Telephone Encounter (Signed)
The test for rsv usually  Done on infants or outbreaks or if in hospital since no specific treatments except supportive care.   So we at this time are not doing these .    You certainly can have had RSV since exposed   and cough can last weeks   With or without wheeze. Adults do much better than infants   But there is  A surge in rsv in the day cares .     You could still get covid infection with vaccine immunity presenting as  mild case   With the delta variant .     you could  get  covid tested .   If not short of breath and no fever  Coughing blood or signs  of pneumonia  It is safe  To wait a bit longer   .   Stay hydrated  Comfort care  Cough meds dont help that much but can try mucinex to loosen mucous  And make it easier to cough.

## 2020-01-01 DIAGNOSIS — Z20828 Contact with and (suspected) exposure to other viral communicable diseases: Secondary | ICD-10-CM | POA: Diagnosis not present

## 2020-01-27 ENCOUNTER — Telehealth (INDEPENDENT_AMBULATORY_CARE_PROVIDER_SITE_OTHER): Payer: Medicare HMO | Admitting: Internal Medicine

## 2020-01-27 ENCOUNTER — Ambulatory Visit (INDEPENDENT_AMBULATORY_CARE_PROVIDER_SITE_OTHER)
Admission: RE | Admit: 2020-01-27 | Discharge: 2020-01-27 | Disposition: A | Discharge status: Home / Self Care | Payer: Medicare HMO | Source: Ambulatory Visit | Attending: Internal Medicine | Admitting: Internal Medicine

## 2020-01-27 ENCOUNTER — Encounter: Payer: Self-pay | Admitting: Internal Medicine

## 2020-01-27 ENCOUNTER — Other Ambulatory Visit: Payer: Self-pay

## 2020-01-27 VITALS — Ht 63.0 in | Wt 150.0 lb

## 2020-01-27 DIAGNOSIS — Z79899 Other long term (current) drug therapy: Secondary | ICD-10-CM | POA: Diagnosis not present

## 2020-01-27 DIAGNOSIS — R053 Chronic cough: Secondary | ICD-10-CM

## 2020-01-27 DIAGNOSIS — R05 Cough: Secondary | ICD-10-CM

## 2020-01-27 DIAGNOSIS — K219 Gastro-esophageal reflux disease without esophagitis: Secondary | ICD-10-CM

## 2020-01-27 NOTE — Progress Notes (Signed)
Chief Complaint  Patient presents with  . Cough    productive, going on since January, drainage in throat and not able to clear her throat since spring, some nasal congestion at times    HPI: Jacqueline Myers 69 y.o. come in for persisted cough  Says there since Jan  But worse in past month?  At night sounds mucous but cannot get enough to spit out  Gets hoarse. Had covid testing  negative 3 weeks ago  To sit with infant GCs and wants to make sure  Not contagious. On omeprazole once a day   Not regarc too mealshas worked although reflux some inc since covid weight gain.  No fever weight loss hemoptysis  ROS: See pertinent positives and negatives per HPI. soome doe  Not new?  No wheezing and had pfts in remot past  No respinse to bronchodilator   Past Medical History:  Diagnosis Date  . Colon polyps    hyperplastic  . Diverticulosis   . Esophageal stricture   . GERD (gastroesophageal reflux disease)   . History of bronchitis   . Hyperlipidemia   . Hypertension   . Mitral valve prolapse   . Osteoarthritis   . Osteoporosis   . Spinal stenosis     Family History  Problem Relation Age of Onset  . Breast cancer Sister   . Alcohol abuse Mother   . Stroke Mother   . Hypertension Mother   . Diabetes Mother   . Hyperlipidemia Mother   . Hyperlipidemia Father   . Colon cancer Neg Hx     Social History   Socioeconomic History  . Marital status: Married    Spouse name: Not on file  . Number of children: 2  . Years of education: Not on file  . Highest education level: Not on file  Occupational History  . Occupation: Retired  Tobacco Use  . Smoking status: Former Smoker    Packs/day: 1.00    Years: 29.00    Pack years: 29.00    Types: Cigarettes    Quit date: 05/09/1999    Years since quitting: 20.7  . Smokeless tobacco: Never Used  Vaping Use  . Vaping Use: Never used  Substance and Sexual Activity  . Alcohol use: Yes    Alcohol/week: 0.0 standard drinks     Comment: OCC- GLASS OF WINE  . Drug use: No  . Sexual activity: Not on file  Other Topics Concern  . 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   Neg td neg ets   Social Determinants of Health   Financial Resource Strain: Low Risk   . Difficulty of Paying Living Expenses: Not hard at all  Food Insecurity: No Food Insecurity  . Worried About Charity fundraiser in the Last Year: Never true  . Ran Out of Food in the Last Year: Never true  Transportation Needs: No Transportation Needs  . Lack of Transportation (Medical): No  . Lack of Transportation (Non-Medical): No  Physical Activity: Inactive  . Days of Exercise per Week: 0 days  . Minutes of Exercise per Session: 0 min  Stress: No Stress Concern Present  . Feeling of Stress : Not at all  Social Connections: Moderately Integrated  . Frequency  of Communication with Friends and Family: Never  . Frequency of Social Gatherings with Friends and Family: More than three times a week  . Attends Religious Services: More than 4 times per year  . Active Member of Clubs or Organizations: No  . Attends Archivist Meetings: Never  . Marital Status: Married    Outpatient Medications Prior to Visit  Medication Sig Dispense Refill  . acetaminophen (TYLENOL) 500 MG tablet Take 500-1,000 mg by mouth every 6 (six) hours as needed for moderate pain.    . Candesartan Cilexetil-HCTZ 32-25 MG TABS Take 1 tablet by mouth once daily 90 tablet 1  . citalopram (CELEXA) 10 MG tablet TAKE 1 & 1/2 (ONE & ONE-HALF) TABLETS BY MOUTH ONCE DAILY 135 tablet 1  . cyclobenzaprine (FLEXERIL) 10 MG tablet TAKE 1/2 TO 1 (ONE-HALF TO ONE) TABLET BY MOUTH THREE TIMES DAILY AS NEEDED FOR MUSCLE SPASM 30 tablet 0  . diclofenac sodium (VOLTAREN) 1 % GEL Apply 4 g  topically 4 (four) times daily. 100 g 2  . levothyroxine (SYNTHROID) 50 MCG tablet Take 1/2 (one-half) tablet by mouth once daily 90 tablet 0  . LORazepam (ATIVAN) 1 MG tablet Take 0.5-1 tablets (0.5-1 mg total) by mouth every 8 (eight) hours as needed for anxiety. 15 tablet 0  . Omega-3 Fatty Acids (FISH OIL) 1200 MG CPDR Take 2 capsules by mouth daily.    Marland Kitchen omeprazole (PRILOSEC) 20 MG capsule Take 1 capsule (20 mg total) by mouth daily. 90 capsule 2  . rosuvastatin (CRESTOR) 5 MG tablet Take 1 tablet by mouth once daily 90 tablet 0  . valACYclovir (VALTREX) 1000 MG tablet Take 1 tablet (1,000 mg total) by mouth 3 (three) times daily. As directed 30 tablet 3   No facility-administered medications prior to visit.     EXAM:  Ht 5\' 3"  (1.6 m)   Wt 150 lb (68 kg)   BMI 26.57 kg/m   Body mass index is 26.57 kg/m.  GENERAL: vitals reviewed and listed above, alert, oriented, appears well hydrated and in no acute distress HEENT: atraumatic, conjunctiva  clear, no obvious abnormalities on inspection of external nose and ears OP : no lesion edema or exudate  NECK: no obvious masses on inspection palpation  LUNGS: clear to auscultation bilaterally, no wheezes, rales or rhonchi, good air movement CV: HRRR, no clubbing cyanosis or  peripheral edema nl cap refill  MS: moves all extremities without noticeable focal  abnormality PSYCH: pleasant and cooperative, no obvious depression or anxiety Lab Results  Component Value Date   WBC 6.8 09/24/2019   HGB 13.2 09/24/2019   HCT 39.5 09/24/2019   PLT 268.0 09/24/2019   GLUCOSE 97 09/24/2019   CHOL 172 09/24/2019   TRIG 122.0 09/24/2019   HDL 53.90 09/24/2019   LDLDIRECT 127.6 06/23/2013   LDLCALC 94 09/24/2019   ALT 17 09/24/2019   AST 19 09/24/2019   NA 140 09/24/2019   K 4.2 09/24/2019   CL 103 09/24/2019   CREATININE 0.84 09/24/2019   BUN 16 09/24/2019   CO2 28 09/24/2019   TSH 3.54 09/24/2019   INR 0.93 04/08/2015   HGBA1C 5.9  09/24/2019   BP Readings from Last 3 Encounters:  10/02/19 139/63  09/24/19 128/68  04/02/19 140/82    ASSESSMENT AND PLAN:  Discussed the following assessment and plan:  Cough, persistent - Plan: DG Chest 2 View  Medication management  Gastroesophageal reflux disease, unspecified whether esophagitis present Optimize elr   Take omeprazole  bid  30 - 60 min pre meal  For the next 2+ weeks Get updated cxray  And then  Fu 2 weeks  considier adding  Other inhaler such as  anoro bevespi or  stiolto Or  Steroid  Burst first  Last pfts 2016   Mild copd unresponsive to   Bronchodilator . consider updating   Imaging as indicated .  No obv infection at this time  Get flu vaccine when possible There are no Patient Instructions on file for this visit.   Standley Brooking. Noelia Lenart M.D.

## 2020-01-28 NOTE — Progress Notes (Signed)
X ray is normal nad   reassuring

## 2020-01-29 DIAGNOSIS — R69 Illness, unspecified: Secondary | ICD-10-CM | POA: Diagnosis not present

## 2020-01-30 ENCOUNTER — Other Ambulatory Visit: Payer: Self-pay | Admitting: Internal Medicine

## 2020-02-24 DIAGNOSIS — M546 Pain in thoracic spine: Secondary | ICD-10-CM | POA: Diagnosis not present

## 2020-03-29 ENCOUNTER — Other Ambulatory Visit: Payer: Self-pay | Admitting: Internal Medicine

## 2020-03-30 NOTE — Telephone Encounter (Signed)
recevied a refill request for:  Valtrex 1000  Mg LR 01/25/18, #30. 3 rf's LOV 01/27/20 FOV  None scheduled   Please review and advise.   Thanks.   Dm/cma

## 2020-04-22 ENCOUNTER — Telehealth (INDEPENDENT_AMBULATORY_CARE_PROVIDER_SITE_OTHER): Payer: Medicare HMO | Admitting: Family Medicine

## 2020-04-22 DIAGNOSIS — Z20822 Contact with and (suspected) exposure to covid-19: Secondary | ICD-10-CM | POA: Diagnosis not present

## 2020-04-22 DIAGNOSIS — R0981 Nasal congestion: Secondary | ICD-10-CM | POA: Diagnosis not present

## 2020-04-22 MED ORDER — DOXYCYCLINE HYCLATE 100 MG PO TABS: 100.0000 mg | ORAL_TABLET | Freq: Two times a day (BID) | ORAL | 0 refills | Status: DC

## 2020-04-22 NOTE — Progress Notes (Signed)
Virtual Visit via Video Note  I connected with Jacqueline Myers  on 04/22/20 at 10:00 AM EST by a video enabled telemedicine application and verified that I am speaking with the correct person using two identifiers.  Location patient: home, [place] Location provider:work or home office Persons participating in the virtual visit: patient, provider  I discussed the limitations of evaluation and management by telemedicine and the availability of in person appointments. The patient expressed understanding and agreed to proceed.   HPI:  Acute telemedicine visit for sinus issues: -Onset: started about 10 days ago, but seems to be getting worse -Symptoms include: started with sore throat, nasal congestion, then thick nasal congestion, sinus pain/headaches - seems to be improving some now, cough -she has been around a lot of people and grandkids have cold -she is doing covid test today -Denies: CP, SOB, NVD, body aches, loss of taste -Has tried:nettie pot -Pertinent past medical history:see pmh -Pertinent medication allergies:see allergies, no abx allergies -COVID-19 vaccine status: has had 3 covid vaccines and had her flu vaccine  ROS: See pertinent positives and negatives per HPI.  Past Medical History:  Diagnosis Date   Colon polyps    hyperplastic   Diverticulosis    Esophageal stricture    GERD (gastroesophageal reflux disease)    History of bronchitis    Hyperlipidemia    Hypertension    Mitral valve prolapse    Osteoarthritis    Osteoporosis    Spinal stenosis     Past Surgical History:  Procedure Laterality Date   COLONOSCOPY     NM MYOVIEW LTD  08/2006   normal for abn ekg   TONSILLECTOMY     TOTAL KNEE ARTHROPLASTY Left 04/14/2015   Procedure: LEFT TOTAL KNEE ARTHROPLASTY;  Surgeon: Gaynelle Arabian, MD;  Location: [facility];  Service: Orthopedics;  Laterality: Left;     Current Outpatient Medications:    acetaminophen (TYLENOL) 500 MG tablet, Take 500-1,000 mg by  mouth every 6 (six) hours as needed for moderate pain., Disp: , Rfl:    Candesartan Cilexetil-HCTZ 32-25 MG TABS, Take 1 tablet by mouth once daily, Disp: 90 tablet, Rfl: 1   citalopram (CELEXA) 10 MG tablet, TAKE 1 & 1/2 (ONE & ONE-HALF) TABLETS BY MOUTH ONCE DAILY, Disp: 135 tablet, Rfl: 1   cyclobenzaprine (FLEXERIL) 10 MG tablet, TAKE 1/2 TO 1 (ONE-HALF TO ONE) TABLET BY MOUTH THREE TIMES DAILY AS NEEDED FOR MUSCLE SPASM, Disp: 30 tablet, Rfl: 0   diclofenac sodium (VOLTAREN) 1 % GEL, Apply 4 g topically 4 (four) times daily., Disp: 100 g, Rfl: 2   doxycycline (VIBRA-TABS) 100 MG tablet, Take 1 tablet (100 mg total) by mouth 2 (two) times daily., Disp: 20 tablet, Rfl: 0   levothyroxine (SYNTHROID) 50 MCG tablet, Take 1/2 (one-half) tablet by mouth once daily, Disp: 90 tablet, Rfl: 0   LORazepam (ATIVAN) 1 MG tablet, Take 0.5-1 tablets (0.5-1 mg total) by mouth every 8 (eight) hours as needed for anxiety., Disp: 15 tablet, Rfl: 0   Omega-3 Fatty Acids (FISH OIL) 1200 MG CPDR, Take 2 capsules by mouth daily., Disp: , Rfl:    omeprazole (PRILOSEC) 20 MG capsule, Take 1 capsule (20 mg total) by mouth daily., Disp: 90 capsule, Rfl: 2   rosuvastatin (CRESTOR) 5 MG tablet, Take 1 tablet by mouth once daily, Disp: 90 tablet, Rfl: 0   valACYclovir (VALTREX) 1000 MG tablet, TAKE 1 TABLET BY MOUTH THREE TIMES DAILYAS DIRECTED, Disp: 30 tablet, Rfl: 0  EXAM:  VITALS per  patient if applicable: oral temp 99  GENERAL: alert, oriented, appears well and in no acute distress  HEENT: atraumatic, conjunttiva clear, no obvious abnormalities on inspection of external nose and ears  NECK: normal movements of the head and neck  LUNGS: on inspection no signs of respiratory distress, breathing rate appears normal, no obvious gross SOB, gasping or wheezing  CV: no obvious cyanosis  MS: moves all visible extremities without noticeable abnormality  PSYCH/NEURO: pleasant and cooperative, no obvious  depression or anxiety, speech and thought processing grossly intact  ASSESSMENT AND PLAN:  Discussed the following assessment and plan:  Sinus congestion  -we discussed possible serious and likely etiologies, options for evaluation and workup, limitations of telemedicine visit vs in person visit, treatment, treatment risks and precautions. Pt prefers to treat via telemedicine empirically rather than in person at this moment. Since starting to feel a little better today, query tail end of a VURI vs other. She is concerned for a possible sinusitis and given duration of symptoms opted for delayed rx of Doxy 100mg  bid x10 days if worsening, recurrent sinus discomfort or thick congestion or if persists instead of continuing to improve over the next few days. Discussed [phone] testing, isolation (which she is about finished with at this point), treatment, precuations. Scheduled follow up with PCP offered: agrees to follow up if needed Advised to seek prompt in person care if worsening, new symptoms arise, or if is not improving with treatment. Discussed options for inperson care if PCP office not available. Did let this patient know that I only do telemedicine on Tuesdays and Thursdays for [place]. Advised to schedule follow up visit with PCP or [facility] if any further questions or concerns to avoid delays in care.   I discussed the assessment and treatment plan with the patient. The patient was provided an opportunity to ask questions and all were answered. The patient agreed with the plan and demonstrated an understanding of the instructions.     Jacqueline Kern, DO

## 2020-04-22 NOTE — Patient Instructions (Signed)
-  I sent the medication(s) we discussed to your pharmacy: Meds ordered this encounter  Medications  . doxycycline (VIBRA-TABS) 100 MG tablet    Sig: Take 1 tablet (100 mg total) by mouth 2 (two) times daily.    Dispense:  20 tablet    Refill:  0     I hope you are feeling better soon!  Seek in person care promptly if your symptoms worsen, new concerns arise or you are not improving with treatment.  It was nice to meet you today. I help  out with telemedicine visits on Tuesdays and Thursdays and am available for visits on those days. If you have any concerns or questions following this visit please schedule a follow up visit with your Primary Care doctor or seek care at a local urgent care clinic to avoid delays in care.  

## 2020-04-27 ENCOUNTER — Other Ambulatory Visit: Payer: Self-pay | Admitting: Internal Medicine

## 2020-05-10 DIAGNOSIS — M7918 Myalgia, other site: Secondary | ICD-10-CM | POA: Diagnosis not present

## 2020-06-01 ENCOUNTER — Other Ambulatory Visit: Payer: Self-pay | Admitting: Internal Medicine

## 2020-06-03 ENCOUNTER — Other Ambulatory Visit: Payer: Self-pay | Admitting: Internal Medicine

## 2020-06-24 DIAGNOSIS — M79642 Pain in left hand: Secondary | ICD-10-CM | POA: Diagnosis not present

## 2020-06-24 DIAGNOSIS — M13841 Other specified arthritis, right hand: Secondary | ICD-10-CM | POA: Diagnosis not present

## 2020-06-24 DIAGNOSIS — M79641 Pain in right hand: Secondary | ICD-10-CM | POA: Diagnosis not present

## 2020-07-09 ENCOUNTER — Other Ambulatory Visit: Payer: Self-pay | Admitting: Internal Medicine

## 2020-07-26 DIAGNOSIS — S138XXA Sprain of joints and ligaments of other parts of neck, initial encounter: Secondary | ICD-10-CM | POA: Diagnosis not present

## 2020-07-26 DIAGNOSIS — M9901 Segmental and somatic dysfunction of cervical region: Secondary | ICD-10-CM | POA: Diagnosis not present

## 2020-07-26 DIAGNOSIS — M9902 Segmental and somatic dysfunction of thoracic region: Secondary | ICD-10-CM | POA: Diagnosis not present

## 2020-07-26 DIAGNOSIS — M5134 Other intervertebral disc degeneration, thoracic region: Secondary | ICD-10-CM | POA: Diagnosis not present

## 2020-07-28 DIAGNOSIS — M9902 Segmental and somatic dysfunction of thoracic region: Secondary | ICD-10-CM | POA: Diagnosis not present

## 2020-07-28 DIAGNOSIS — M5134 Other intervertebral disc degeneration, thoracic region: Secondary | ICD-10-CM | POA: Diagnosis not present

## 2020-07-28 DIAGNOSIS — M9901 Segmental and somatic dysfunction of cervical region: Secondary | ICD-10-CM | POA: Diagnosis not present

## 2020-07-28 DIAGNOSIS — S138XXA Sprain of joints and ligaments of other parts of neck, initial encounter: Secondary | ICD-10-CM | POA: Diagnosis not present

## 2020-08-02 DIAGNOSIS — Z803 Family history of malignant neoplasm of breast: Secondary | ICD-10-CM | POA: Diagnosis not present

## 2020-08-02 DIAGNOSIS — Z1231 Encounter for screening mammogram for malignant neoplasm of breast: Secondary | ICD-10-CM | POA: Diagnosis not present

## 2020-08-02 LAB — HM MAMMOGRAPHY

## 2020-08-03 DIAGNOSIS — M9901 Segmental and somatic dysfunction of cervical region: Secondary | ICD-10-CM | POA: Diagnosis not present

## 2020-08-03 DIAGNOSIS — M9902 Segmental and somatic dysfunction of thoracic region: Secondary | ICD-10-CM | POA: Diagnosis not present

## 2020-08-03 DIAGNOSIS — M5134 Other intervertebral disc degeneration, thoracic region: Secondary | ICD-10-CM | POA: Diagnosis not present

## 2020-08-03 DIAGNOSIS — S138XXA Sprain of joints and ligaments of other parts of neck, initial encounter: Secondary | ICD-10-CM | POA: Diagnosis not present

## 2020-08-04 ENCOUNTER — Encounter: Payer: Self-pay | Admitting: Internal Medicine

## 2020-08-06 DIAGNOSIS — M9902 Segmental and somatic dysfunction of thoracic region: Secondary | ICD-10-CM | POA: Diagnosis not present

## 2020-08-06 DIAGNOSIS — M9901 Segmental and somatic dysfunction of cervical region: Secondary | ICD-10-CM | POA: Diagnosis not present

## 2020-08-06 DIAGNOSIS — M5134 Other intervertebral disc degeneration, thoracic region: Secondary | ICD-10-CM | POA: Diagnosis not present

## 2020-08-06 DIAGNOSIS — S138XXA Sprain of joints and ligaments of other parts of neck, initial encounter: Secondary | ICD-10-CM | POA: Diagnosis not present

## 2020-08-09 DIAGNOSIS — M9901 Segmental and somatic dysfunction of cervical region: Secondary | ICD-10-CM | POA: Diagnosis not present

## 2020-08-09 DIAGNOSIS — M9902 Segmental and somatic dysfunction of thoracic region: Secondary | ICD-10-CM | POA: Diagnosis not present

## 2020-08-09 DIAGNOSIS — S138XXA Sprain of joints and ligaments of other parts of neck, initial encounter: Secondary | ICD-10-CM | POA: Diagnosis not present

## 2020-08-09 DIAGNOSIS — M5134 Other intervertebral disc degeneration, thoracic region: Secondary | ICD-10-CM | POA: Diagnosis not present

## 2020-08-17 DIAGNOSIS — S138XXA Sprain of joints and ligaments of other parts of neck, initial encounter: Secondary | ICD-10-CM | POA: Diagnosis not present

## 2020-08-17 DIAGNOSIS — M5134 Other intervertebral disc degeneration, thoracic region: Secondary | ICD-10-CM | POA: Diagnosis not present

## 2020-08-17 DIAGNOSIS — M9902 Segmental and somatic dysfunction of thoracic region: Secondary | ICD-10-CM | POA: Diagnosis not present

## 2020-08-17 DIAGNOSIS — M9901 Segmental and somatic dysfunction of cervical region: Secondary | ICD-10-CM | POA: Diagnosis not present

## 2020-08-23 DIAGNOSIS — M5134 Other intervertebral disc degeneration, thoracic region: Secondary | ICD-10-CM | POA: Diagnosis not present

## 2020-08-23 DIAGNOSIS — M9902 Segmental and somatic dysfunction of thoracic region: Secondary | ICD-10-CM | POA: Diagnosis not present

## 2020-08-23 DIAGNOSIS — M9901 Segmental and somatic dysfunction of cervical region: Secondary | ICD-10-CM | POA: Diagnosis not present

## 2020-08-23 DIAGNOSIS — S138XXA Sprain of joints and ligaments of other parts of neck, initial encounter: Secondary | ICD-10-CM | POA: Diagnosis not present

## 2020-08-25 NOTE — Progress Notes (Signed)
Cardiology Office Note   Date:  08/27/2020   ID:  Jacqueline, Myers 11/29/1950, MRN [id number]  PCP:  Burnis Medin, MD  Cardiologist: Dr.Moro  CC: 6 month follow up    History of Present Illness: Jacqueline Myers is a 70 y.o. female who presents for ongoing assessment and management of mitral valve prolapse, hyperlipidemia, hypertension, with history of GERD and esophageal stricture along with anxiety.  Most recent nuclear medicine study was on 03/01/2016 and was found to be normal. However, due to her recurrent symptoms, we repeated her stress Myoview for evaluation for ischemia and also echocardiogram was ordered to evaluate for changes in LV function.  Repeat stress test on 03/06/2019 revealed normal with no evidence of ischemia. Echocardiogram 03/10/2019 revealed that she had normal LVEF of 60% to 65%. She did have some mild left ventricular hypertrophy and grade 1 diastolic dysfunction. There were no valvular abnormalities.  She comes today doing very well.  She has been dieting and lost 14 pounds over the last 2 months using [employer].  She is also being followed by a chiropractor due to cervical and thoracic spinal stenosis.  X-ray was completed by chiropractor which did show some aortic atherosclerosis she brings a CD of this x-ray with her today.  She does have some continued dyspnea on exertion but she is not very active as she has some chronic back pain, hip pain and knee pain.  Socially, she is caring for her 40 year old mother who is on hospice, and 69-month-old grandchildren.  She states that sometimes it is difficult for her and she is doing her best to lose weight so she will have more stamina.  She denies any chest pain, dizziness, or excessive fatigue.  She is medically compliant.  Past Medical History:  Diagnosis Date  . Colon polyps    hyperplastic  . Diverticulosis   . Esophageal stricture   . GERD (gastroesophageal reflux disease)   . History of  bronchitis   . Hyperlipidemia   . Hypertension   . Mitral valve prolapse   . Osteoarthritis   . Osteoporosis   . Spinal stenosis     Past Surgical History:  Procedure Laterality Date  . COLONOSCOPY    . NM MYOVIEW LTD  08/2006   normal for abn ekg  . TONSILLECTOMY    . TOTAL KNEE ARTHROPLASTY Left 04/14/2015   Procedure: LEFT TOTAL KNEE ARTHROPLASTY;  Surgeon: Gaynelle Arabian, MD;  Location: [facility];  Service: Orthopedics;  Laterality: Left;     Current Outpatient Medications  Medication Sig Dispense Refill  . acetaminophen (TYLENOL) 500 MG tablet Take 500-1,000 mg by mouth every 6 (six) hours as needed for moderate pain.    . Candesartan Cilexetil-HCTZ 32-25 MG TABS Take 1 tablet by mouth once daily 90 tablet 0  . citalopram (CELEXA) 10 MG tablet TAKE 1 & 1/2 (ONE & ONE-HALF) TABLETS BY MOUTH ONCE DAILY 135 tablet 0  . cyclobenzaprine (FLEXERIL) 10 MG tablet TAKE 1/2 TO 1 (ONE-HALF TO ONE) TABLET BY MOUTH THREE TIMES DAILY AS NEEDED FOR MUSCLE SPASM 30 tablet 0  . diclofenac sodium (VOLTAREN) 1 % GEL Apply 4 g topically 4 (four) times daily. 100 g 2  . doxycycline (VIBRA-TABS) 100 MG tablet Take 1 tablet (100 mg total) by mouth 2 (two) times daily. 20 tablet 0  . levothyroxine (SYNTHROID) 50 MCG tablet Take 1/2 (one-half) tablet by mouth once daily 45 tablet 0  . LORazepam (ATIVAN) 1 MG tablet Take 0.5-1 tablets (  0.5-1 mg total) by mouth every 8 (eight) hours as needed for anxiety. 15 tablet 0  . Omega-3 Fatty Acids (FISH OIL) 1200 MG CPDR Take 2 capsules by mouth daily.    Marland Kitchen omeprazole (PRILOSEC) 20 MG capsule Take 1 capsule by mouth once daily 90 capsule 0  . rosuvastatin (CRESTOR) 5 MG tablet Take 1 tablet by mouth once daily 90 tablet 0  . valACYclovir (VALTREX) 1000 MG tablet TAKE 1 TABLET BY MOUTH THREE TIMES DAILYAS DIRECTED 30 tablet 0   No current facility-administered medications for this visit.    Allergies:   Morphine and related, Other, Statins, Lisinopril, Oxycodone,  and Simvastatin    Social History:  The patient  reports that she quit smoking about 21 years ago. Her smoking use included cigarettes. She has a 29.00 pack-year smoking history. She has never used smokeless tobacco. She reports current alcohol use. She reports that she does not use drugs.   Family History:  The patient's family history includes Alcohol abuse in her mother; Breast cancer in her sister; Diabetes in her mother; Hyperlipidemia in her father and mother; Hypertension in her mother; Stroke in her mother.    ROS: All other systems are reviewed and negative. Unless otherwise mentioned in H&P    PHYSICAL EXAM: VS:  BP (!) 104/53   Pulse 67   Ht 5\' 3"  (1.6 m)   Wt 136 lb (61.7 kg)   SpO2 97%   BMI 24.09 kg/m  , BMI Body mass index is 24.09 kg/m. GEN: Well nourished, well developed, in no acute distress HEENT: normal Neck: no JVD, carotid bruits, or masses Cardiac: RRR; no murmurs, rubs, or gallops,no edema  Respiratory:  Clear to auscultation bilaterally, normal work of breathing GI: soft, nontender, nondistended, + BS MS: no deformity or atrophy Skin: warm and dry, no rash Neuro:  Strength and sensation are intact Psych: euthymic mood, full affect   EKG: Normal sinus rhythm ventricular rate of 67 bpm.  (Personally reviewed)  Recent Labs: 09/24/2019: ALT 17; BUN 16; Creatinine, Ser 0.84; Hemoglobin 13.2; Platelets 268.0; Potassium 4.2; Sodium 140; TSH 3.54    Lipid Panel    Component Value Date/Time   CHOL 172 09/24/2019 1105   TRIG 122.0 09/24/2019 1105   TRIG 145 04/06/2006 0808   HDL 53.90 09/24/2019 1105   CHOLHDL 3 09/24/2019 1105   VLDL 24.4 09/24/2019 1105   LDLCALC 94 09/24/2019 1105   LDLDIRECT 127.6 06/23/2013 0807      Wt Readings from Last 3 Encounters:  08/27/20 136 lb (61.7 kg)  01/27/20 150 lb (68 kg)  10/02/19 150 lb (68 kg)      Other studies Reviewed: Echocardiogram 03/22/19 1. Left ventricular ejection fraction, by visual  estimation, is 60 to 65%. The left ventricle has normal function. There is moderately increased left ventricular hypertrophy. 2. Left ventricular diastolic parameters are consistent with Grade I diastolic dysfunction (impaired relaxation). 3. Global right ventricle has normal systolic function.The right ventricular size is normal. No increase in right ventricular wall thickness. 4. Left atrial size was normal. 5. Right atrial size was normal. 6. The mitral valve is grossly normal. Trace mitral valve regurgitation. 7. No diagnostic mitral valve prolapse. 8. The tricuspid valve is grossly normal. Tricuspid valve regurgitation is trivial. 9. The aortic valve is tricuspid. Aortic valve regurgitation is not visualized. 10. The pulmonic valve was grossly normal. Pulmonic valve regurgitation is not visualized. 11. The inferior vena cava is normal in size with greater than 50% respiratory  variability, suggesting right atrial pressure of 3 mmHg.  Study Highlights    The left ventricular ejection fraction is hyperdynamic (>65%).  Nuclear stress EF: 79%.  There was no ST segment deviation noted during stress.  The study is normal.  This is a low risk study.  Normal stress nuclear study with no ischemia or infarction. Gated ejection fraction 79% with normal wall motion.       ASSESSMENT AND PLAN:  1. Mitral Regurgitation;: Asymptomatic at this time.  Most recent echocardiogram 2020 did not show any significant abnormalities or prolapse.  Right okay continue low-cholesterol diet with weight loss.  She is adhering to [place] and has lost 14 pounds she is congratulated on this.  Goal of LDL less than 100  3.  Hypertension: Blood pressure is extremely well controlled she is asymptomatic concerning any dizziness or fatigue with low normal blood pressure.  No changes in her regimen.  Labs are being followed by PCP.   Current medicines are reviewed at length with the  patient today.  I have spent 25 minutes dedicated to the care of this patient on the date of this encounter to include pre-visit review of records, assessment, management and diagnostic testing,with shared decision making.  Labs/ tests ordered today include: None  Phill Myron. West Pugh, ANP, [facility]   08/27/2020 8:36 AM    [address] Office [phone] Fax [phone]  Notice: This dictation was prepared with Dragon dictation along with smaller phrase technology. Any transcriptional errors that result from this process are unintentional and may not be corrected upon review.

## 2020-08-26 ENCOUNTER — Other Ambulatory Visit: Payer: Self-pay | Admitting: Internal Medicine

## 2020-08-27 ENCOUNTER — Other Ambulatory Visit: Payer: Self-pay

## 2020-08-27 ENCOUNTER — Encounter: Payer: Self-pay | Admitting: Adult Health

## 2020-08-27 ENCOUNTER — Ambulatory Visit: Payer: Medicare HMO | Admitting: Adult Health

## 2020-08-27 VITALS — BP 104/53 | HR 67 | Ht 63.0 in | Wt 136.0 lb

## 2020-08-27 DIAGNOSIS — I10 Essential (primary) hypertension: Secondary | ICD-10-CM

## 2020-08-27 DIAGNOSIS — M1610 Unilateral primary osteoarthritis, unspecified hip: Secondary | ICD-10-CM | POA: Diagnosis not present

## 2020-08-27 DIAGNOSIS — E7800 Pure hypercholesterolemia, unspecified: Secondary | ICD-10-CM | POA: Diagnosis not present

## 2020-08-27 NOTE — Patient Instructions (Signed)
Medication Instructions:  Continue current medications  *If you need a refill on your cardiac medications before your next appointment, please call your pharmacy*   Lab Work: None Ordered   Testing/Procedures: None Ordered   Follow-Up: At [facility], you and your health needs are our priority.  As part of our continuing mission to provide you with exceptional heart care, we have created designated Provider Care Teams.  These Care Teams include your primary Cardiologist (physician) and Advanced Practice Providers (APPs -  Physician Assistants and Nurse Practitioners) who all work together to provide you with the care you need, when you need it.  We recommend signing up for the patient portal called "MyChart".  Sign up information is provided on this After Visit Summary.  MyChart is used to connect with patients for Virtual Visits (Telemedicine).  Patients are able to view lab/test results, encounter notes, upcoming appointments, etc.  Non-urgent messages can be sent to your provider as well.   To learn more about what you can do with MyChart, go to [email or web address].    Your next appointment:   1 year(s)  The format for your next appointment:   In Person  Provider:   You may see Tiffany Palominas, MD or one of the following Advanced Practice Providers on your designated Care Team:    Callie Goodrich, PA-C  Jesse Cleaver, FNP      

## 2020-08-28 ENCOUNTER — Other Ambulatory Visit: Payer: Self-pay | Admitting: Internal Medicine

## 2020-08-30 DIAGNOSIS — M9902 Segmental and somatic dysfunction of thoracic region: Secondary | ICD-10-CM | POA: Diagnosis not present

## 2020-08-30 DIAGNOSIS — M5134 Other intervertebral disc degeneration, thoracic region: Secondary | ICD-10-CM | POA: Diagnosis not present

## 2020-08-30 DIAGNOSIS — M9901 Segmental and somatic dysfunction of cervical region: Secondary | ICD-10-CM | POA: Diagnosis not present

## 2020-08-30 DIAGNOSIS — S138XXA Sprain of joints and ligaments of other parts of neck, initial encounter: Secondary | ICD-10-CM | POA: Diagnosis not present

## 2020-09-06 DIAGNOSIS — M9902 Segmental and somatic dysfunction of thoracic region: Secondary | ICD-10-CM | POA: Diagnosis not present

## 2020-09-06 DIAGNOSIS — M5134 Other intervertebral disc degeneration, thoracic region: Secondary | ICD-10-CM | POA: Diagnosis not present

## 2020-09-06 DIAGNOSIS — M9901 Segmental and somatic dysfunction of cervical region: Secondary | ICD-10-CM | POA: Diagnosis not present

## 2020-09-06 DIAGNOSIS — S138XXA Sprain of joints and ligaments of other parts of neck, initial encounter: Secondary | ICD-10-CM | POA: Diagnosis not present

## 2020-09-13 DIAGNOSIS — M9901 Segmental and somatic dysfunction of cervical region: Secondary | ICD-10-CM | POA: Diagnosis not present

## 2020-09-13 DIAGNOSIS — M9902 Segmental and somatic dysfunction of thoracic region: Secondary | ICD-10-CM | POA: Diagnosis not present

## 2020-09-13 DIAGNOSIS — S138XXA Sprain of joints and ligaments of other parts of neck, initial encounter: Secondary | ICD-10-CM | POA: Diagnosis not present

## 2020-09-13 DIAGNOSIS — M5134 Other intervertebral disc degeneration, thoracic region: Secondary | ICD-10-CM | POA: Diagnosis not present

## 2020-09-20 DIAGNOSIS — M5134 Other intervertebral disc degeneration, thoracic region: Secondary | ICD-10-CM | POA: Diagnosis not present

## 2020-09-20 DIAGNOSIS — S138XXA Sprain of joints and ligaments of other parts of neck, initial encounter: Secondary | ICD-10-CM | POA: Diagnosis not present

## 2020-09-20 DIAGNOSIS — M9902 Segmental and somatic dysfunction of thoracic region: Secondary | ICD-10-CM | POA: Diagnosis not present

## 2020-09-20 DIAGNOSIS — M9901 Segmental and somatic dysfunction of cervical region: Secondary | ICD-10-CM | POA: Diagnosis not present

## 2020-10-11 DIAGNOSIS — M5134 Other intervertebral disc degeneration, thoracic region: Secondary | ICD-10-CM | POA: Diagnosis not present

## 2020-10-11 DIAGNOSIS — S138XXA Sprain of joints and ligaments of other parts of neck, initial encounter: Secondary | ICD-10-CM | POA: Diagnosis not present

## 2020-10-11 DIAGNOSIS — M9901 Segmental and somatic dysfunction of cervical region: Secondary | ICD-10-CM | POA: Diagnosis not present

## 2020-10-11 DIAGNOSIS — M9902 Segmental and somatic dysfunction of thoracic region: Secondary | ICD-10-CM | POA: Diagnosis not present

## 2020-10-25 DIAGNOSIS — M5134 Other intervertebral disc degeneration, thoracic region: Secondary | ICD-10-CM | POA: Diagnosis not present

## 2020-10-25 DIAGNOSIS — S138XXA Sprain of joints and ligaments of other parts of neck, initial encounter: Secondary | ICD-10-CM | POA: Diagnosis not present

## 2020-10-25 DIAGNOSIS — M9901 Segmental and somatic dysfunction of cervical region: Secondary | ICD-10-CM | POA: Diagnosis not present

## 2020-10-25 DIAGNOSIS — M9902 Segmental and somatic dysfunction of thoracic region: Secondary | ICD-10-CM | POA: Diagnosis not present

## 2020-11-04 DIAGNOSIS — H52203 Unspecified astigmatism, bilateral: Secondary | ICD-10-CM | POA: Diagnosis not present

## 2020-11-04 DIAGNOSIS — Z961 Presence of intraocular lens: Secondary | ICD-10-CM | POA: Diagnosis not present

## 2020-11-04 DIAGNOSIS — H524 Presbyopia: Secondary | ICD-10-CM | POA: Diagnosis not present

## 2020-11-09 DIAGNOSIS — M5134 Other intervertebral disc degeneration, thoracic region: Secondary | ICD-10-CM | POA: Diagnosis not present

## 2020-11-09 DIAGNOSIS — M9902 Segmental and somatic dysfunction of thoracic region: Secondary | ICD-10-CM | POA: Diagnosis not present

## 2020-11-09 DIAGNOSIS — S138XXA Sprain of joints and ligaments of other parts of neck, initial encounter: Secondary | ICD-10-CM | POA: Diagnosis not present

## 2020-11-09 DIAGNOSIS — M9901 Segmental and somatic dysfunction of cervical region: Secondary | ICD-10-CM | POA: Diagnosis not present

## 2020-11-11 ENCOUNTER — Other Ambulatory Visit: Payer: Self-pay | Admitting: Internal Medicine

## 2020-11-11 NOTE — Telephone Encounter (Signed)
Orders for bone density have been faxed to [phone] and pt is aware.

## 2020-11-11 NOTE — Telephone Encounter (Signed)
Ok    last one  in system was at [facility] in 2018   Send order to [facility]  for  dexa scan  dx estrogen deficiency ( will have to fax  I think)  Fu with  results if needed

## 2020-11-16 DIAGNOSIS — M8589 Other specified disorders of bone density and structure, multiple sites: Secondary | ICD-10-CM | POA: Diagnosis not present

## 2020-11-16 LAB — HM DEXA SCAN

## 2020-11-23 ENCOUNTER — Encounter: Payer: Self-pay | Admitting: Internal Medicine

## 2020-11-24 DIAGNOSIS — S138XXA Sprain of joints and ligaments of other parts of neck, initial encounter: Secondary | ICD-10-CM | POA: Diagnosis not present

## 2020-11-24 DIAGNOSIS — M9901 Segmental and somatic dysfunction of cervical region: Secondary | ICD-10-CM | POA: Diagnosis not present

## 2020-11-24 DIAGNOSIS — M9902 Segmental and somatic dysfunction of thoracic region: Secondary | ICD-10-CM | POA: Diagnosis not present

## 2020-11-24 DIAGNOSIS — M5134 Other intervertebral disc degeneration, thoracic region: Secondary | ICD-10-CM | POA: Diagnosis not present

## 2020-11-25 ENCOUNTER — Telehealth: Payer: Self-pay | Admitting: Internal Medicine

## 2020-11-25 NOTE — Telephone Encounter (Signed)
Left message for patient to call back and schedule Medicare Annual Wellness Visit (AWV) either virtually or in office.   Last AWV 12/05/19 please schedule at anytime with [facility] Nurse Health Advisor 1 or 2   This should be a 45 minute visit.

## 2020-12-07 ENCOUNTER — Other Ambulatory Visit: Payer: Self-pay | Admitting: Internal Medicine

## 2020-12-07 ENCOUNTER — Ambulatory Visit (INDEPENDENT_AMBULATORY_CARE_PROVIDER_SITE_OTHER): Payer: Medicare HMO

## 2020-12-07 DIAGNOSIS — Z0000 Encounter for general adult medical examination without abnormal findings: Secondary | ICD-10-CM

## 2020-12-07 NOTE — Progress Notes (Signed)
Subjective:   Jacqueline Myers is a 70 y.o. female who presents for Medicare Annual (Subsequent) preventive examination.   I connected with [name] today by telephone and verified that I am speaking with the correct person using two identifiers. Location patient: home Location provider: work Persons participating in the virtual visit: patient, provider.   I discussed the limitations, risks, security and privacy concerns of performing an evaluation and management service by telephone and the availability of in person appointments. I also discussed with the patient that there may be a patient responsible charge related to this service. The patient expressed understanding and verbally consented to this telephonic visit.    Interactive audio and video telecommunications were attempted between this provider and patient, however failed, due to patient having technical difficulties OR patient did not have access to video capability.  We continued and completed visit with audio only.    Review of Systems    N/a       Objective:    There were no vitals filed for this visit. There is no height or weight on file to calculate BMI.  Advanced Directives 12/05/2019 04/14/2015 04/14/2015 04/08/2015  Does Patient Have a Medical Advance Directive? Yes Yes Yes No  Type of Paramedic of [place];Living will [place];Living will [place];Living will -  Does patient want to make changes to medical advance directive? No - Patient declined - - -  Copy of [place] in Chart? Yes - validated most recent copy scanned in chart (See row information) Yes Yes -  Would patient like information on creating a medical advance directive? - No - patient declined information No - patient declined information Yes - Educational materials given    Current Medications (verified) Outpatient Encounter Medications as of 12/07/2020   Medication Sig   acetaminophen (TYLENOL) 500 MG tablet Take 500-1,000 mg by mouth every 6 (six) hours as needed for moderate pain.   Candesartan Cilexetil-HCTZ 32-25 MG TABS Take 1 tablet by mouth once daily   citalopram (CELEXA) 10 MG tablet TAKE 1 & 1/2 (ONE & ONE-HALF) TABLETS BY MOUTH ONCE DAILY   cyclobenzaprine (FLEXERIL) 10 MG tablet TAKE 1/2 TO 1 (ONE-HALF TO ONE) TABLET BY MOUTH THREE TIMES DAILY AS NEEDED FOR MUSCLE SPASM   diclofenac sodium (VOLTAREN) 1 % GEL Apply 4 g topically 4 (four) times daily.   doxycycline (VIBRA-TABS) 100 MG tablet Take 1 tablet (100 mg total) by mouth 2 (two) times daily.   levothyroxine (SYNTHROID) 50 MCG tablet Take 1/2 (one-half) tablet by mouth once daily   LORazepam (ATIVAN) 1 MG tablet Take 0.5-1 tablets (0.5-1 mg total) by mouth every 8 (eight) hours as needed for anxiety.   Omega-3 Fatty Acids (FISH OIL) 1200 MG CPDR Take 2 capsules by mouth daily.   omeprazole (PRILOSEC) 20 MG capsule Take 1 capsule by mouth once daily   rosuvastatin (CRESTOR) 5 MG tablet Take 1 tablet by mouth once daily   valACYclovir (VALTREX) 1000 MG tablet TAKE 1 TABLET BY MOUTH THREE TIMES DAILYAS DIRECTED   No facility-administered encounter medications on file as of 12/07/2020.    Allergies (verified) Morphine and related, Other, Statins, Lisinopril, Oxycodone, and Simvastatin   History: Past Medical History:  Diagnosis Date   Colon polyps    hyperplastic   Diverticulosis    Esophageal stricture    GERD (gastroesophageal reflux disease)    History of bronchitis    Hyperlipidemia  Hypertension    Mitral valve prolapse    Osteoarthritis    Osteoporosis    Spinal stenosis    Past Surgical History:  Procedure Laterality Date   COLONOSCOPY     NM MYOVIEW LTD  08/2006   normal for abn ekg   TONSILLECTOMY     TOTAL KNEE ARTHROPLASTY Left 04/14/2015   Procedure: LEFT TOTAL KNEE ARTHROPLASTY;  Surgeon: Gaynelle Arabian, MD;  Location: [facility];  Service:  Orthopedics;  Laterality: Left;   Family History  Problem Relation Age of Onset   Breast cancer Sister    Alcohol abuse Mother    Stroke Mother    Hypertension Mother    Diabetes Mother    Hyperlipidemia Mother    Hyperlipidemia Father    Colon cancer Neg Hx    Social History   Socioeconomic History   Marital status: Married    Spouse name: Not on file   Number of children: 2   Years of education: Not on file   Highest education level: Not on file  Occupational History   Occupation: Retired  Tobacco Use   Smoking status: Former    Packs/day: 1.00    Years: 29.00    Pack years: 29.00    Types: Cigarettes    Quit date: 05/09/1999    Years since quitting: 21.5   Smokeless tobacco: Never  Vaping Use   Vaping Use: Never used  Substance and Sexual Activity   Alcohol use: Yes    Alcohol/week: 0.0 standard drinks    Comment: OCC- [place]   Drug use: No   Sexual activity: Not on file  Other Topics Concern   Not on file  Social History Narrative   Married   HH of 2 helps take care 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   Neg td neg ets   Social Determinants of Health   Financial Resource Strain: Not on file  Food Insecurity: Not on file  Transportation Needs: Not on file  Physical Activity: Not on file  Stress: Not on file  Social Connections: Not on file    Tobacco Counseling Counseling given: Not Answered   Clinical Intake:                 Diabetic?no         Activities of Daily Living No flowsheet data found.  Patient Care Team: Panosh, Standley Brooking, MD as PCP - General Skeet Latch, MD as PCP - Cardiology (Cardiology) Irene Shipper, MD (Gastroenterology) Gaynelle Arabian, MD as Consulting Physician (Orthopedic Surgery) Susa Day, MD as Consulting Physician (Orthopedic Surgery)  Indicate any recent  Medical Services you may have received from other than [facility] providers in the past year (date may be approximate).     Assessment:   This is a routine wellness examination for [place].  Hearing/Vision screen No results found.  Dietary issues and exercise activities discussed:     Goals Addressed   None    Depression Screen PHQ 2/9 Scores 12/05/2019 09/24/2019 06/21/2017 06/20/2016 06/28/2015 06/28/2015 06/30/2013  PHQ - 2 Score 0 0 0 0 0 0 0  PHQ- 9 Score 0 0 - - - - -    Fall Risk Fall Risk  12/05/2019 06/21/2017 06/20/2016 06/28/2015 06/28/2015  Falls in the past year? 0 No No No No  Number falls in past yr: 0 - - - -  Injury with Fall? 0 - - - -  Risk for fall due to : Orthopedic patient;Medication side effect - - - -  Follow up Falls prevention discussed;Falls evaluation completed - - - -    FALL RISK PREVENTION PERTAINING TO THE HOME:  Any stairs in or around the home? Yes  If so, are there any without handrails? Yes  Home free of loose throw rugs in walkways, pet beds, electrical cords, etc? Yes  Adequate lighting in your home to reduce risk of falls? Yes   ASSISTIVE DEVICES UTILIZED TO PREVENT FALLS:  Life alert? No  Use of a cane, walker or w/c? No  Grab bars in the bathroom? Yes  Shower chair or bench in shower? Yes  Elevated toilet seat or a handicapped toilet? Yes    Cognitive Function:    Normal cognitive status assessed by direct observation by this Nurse Health Advisor. No abnormalities found.   6CIT Screen 12/05/2019  What Year? 0 points  What month? 0 points  What time? 0 points  Count back from 20 0 points  Months in reverse 0 points  Repeat phrase 0 points  Total Score 0    Immunizations Immunization History  Administered Date(s) Administered   Fluad Quad(high Dose 65+) 01/15/2019   Influenza Split 04/20/2011, 03/08/2012, 03/12/2013   Influenza Whole 04/28/2009, 05/04/2010   Influenza, High Dose Seasonal PF 02/08/2015, 01/13/2016, 01/30/2017,  01/24/2018, 01/29/2020   Influenza,inj,Quad PF,6+ Mos 12/29/2013   Influenza,inj,quad, With Preservative 01/06/2017   PFIZER(Purple Top)SARS-COV-2 Vaccination 06/16/2019, 07/11/2019, 08/23/2020   Pneumococcal Conjugate-13 06/28/2015   Pneumococcal Polysaccharide-23 06/20/2016   Pneumococcal-Unspecified 05/09/2016   Td 05/08/2004   Tdap 07/03/2014   Zoster, Live 06/28/2011    TDAP status: Up to date  Flu Vaccine status: Up to date  Pneumococcal vaccine status: Up to date  Covid-19 vaccine status: Completed vaccines  Qualifies for Shingles Vaccine? Yes   Zostavax completed No   Shingrix Completed?: No.    Education has been provided regarding the importance of this vaccine. Patient has been advised to call insurance company to determine out of pocket expense if they have not yet received this vaccine. Advised may also receive vaccine at local pharmacy or Health Dept. Verbalized acceptance and understanding.  Screening Tests Health Maintenance  Topic Date Due   Zoster Vaccines- Shingrix (1 of 2) Never done   COVID-19 Vaccine (4 - Booster for Pfizer series) 11/22/2020   INFLUENZA VACCINE  12/06/2020   MAMMOGRAM  08/03/2022   COLONOSCOPY (Pts 45-76yr Insurance coverage will need to be confirmed)  01/17/2023   TETANUS/TDAP  07/03/2024   DEXA SCAN  Completed   Hepatitis C Screening  Completed   PNA vac Low Risk Adult  Completed   HPV VACCINES  Aged Out    Health Maintenance  Health Maintenance Due  Topic Date Due   Zoster Vaccines- Shingrix (1 of 2) Never done   COVID-19 Vaccine (4 - Booster for Pfizer series) 11/22/2020   INFLUENZA VACCINE  12/06/2020    Colorectal cancer screening: Type of screening: Colonoscopy. Completed 01/16/2013. Repeat every 10 years  Mammogram status: Ordered 08/02/2020. Pt provided with contact info and advised to call to schedule appt.   Bone Density status: Completed 11/16/2020. Results reflect: Bone density results: NORMAL. Repeat every 5  years.  Lung Cancer Screening: (Low Dose CT Chest recommended if Age 455-80years, 30 pack-year currently smoking OR have quit w/in 15years.) does not qualify.   Lung Cancer Screening Referral: n/a  Additional Screening:  Hepatitis C Screening: does not qualify; Completed 06/20/2016  Vision Screening: Recommended annual ophthalmology exams for early detection of glaucoma and other disorders of the eye. Is the patient up to date with their annual eye exam?  Yes  Who is the provider or what is the name of the office in which the patient attends annual eye exams? Dr.Shapiro  If pt is not established with a provider, would they like to be referred to a provider to establish care? No .   Dental Screening: Recommended annual dental exams for proper oral hygiene  Community Resource Referral / Chronic Care Management: CRR required this visit?  No   CCM required this visit?  No      Plan:     I have personally reviewed and noted the following in the patient's chart:   Medical and social history Use of alcohol, tobacco or illicit drugs  Current medications and supplements including opioid prescriptions.  Functional ability and status Nutritional status Physical activity Advanced directives List of other physicians Hospitalizations, surgeries, and ER visits in previous 12 months Vitals Screenings to include cognitive, depression, and falls Referrals and appointments  In addition, I have reviewed and discussed with patient certain preventive protocols, quality metrics, and best practice recommendations. A written personalized care plan for preventive services as well as general preventive health recommendations were provided to patient.     Randel Pigg, LPN   [id number]   Nurse Notes: none

## 2020-12-07 NOTE — Patient Instructions (Signed)
Jacqueline Myers , Thank you for taking time to come for your Medicare Wellness Visit. I appreciate your ongoing commitment to your health goals. Please review the following plan we discussed and let me know if I can assist you in the future.   Screening recommendations/referrals: Colonoscopy: 01/16/2013  due 2024 Mammogram: 08/02/2020 Bone Density: 11/16/2020 Recommended yearly ophthalmology/optometry visit for glaucoma screening and checkup Recommended yearly dental visit for hygiene and checkup  Vaccinations: Influenza vaccine: due in fall 2022  Pneumococcal vaccine: completed series  Tdap vaccine: 07/03/2014 Shingles vaccine: will consider     Advanced directives: will provide copies   Conditions/risks identified: none   Next appointment: none    Preventive Care 31 Years and Older, Female Preventive care refers to lifestyle choices and visits with your health care provider that can promote health and wellness. What does preventive care include? A yearly physical exam. This is also called an annual well check. Dental exams once or twice a year. Routine eye exams. Ask your health care provider how often you should have your eyes checked. Personal lifestyle choices, including: Daily care of your teeth and gums. Regular physical activity. Eating a healthy diet. Avoiding tobacco and drug use. Limiting alcohol use. Practicing safe sex. Taking low-dose aspirin every day. Taking vitamin and mineral supplements as recommended by your health care provider. What happens during an annual well check? The services and screenings done by your health care provider during your annual well check will depend on your age, overall health, lifestyle risk factors, and family history of disease. Counseling  Your health care provider may ask you questions about your: Alcohol use. Tobacco use. Drug use. Emotional well-being. Home and relationship well-being. Sexual activity. Eating  habits. History of falls. Memory and ability to understand (cognition). Work and work Statistician. Reproductive health. Screening  You may have the following tests or measurements: Height, weight, and BMI. Blood pressure. Lipid and cholesterol levels. These may be checked every 5 years, or more frequently if you are over 1 years old. Skin check. Lung cancer screening. You may have this screening every year starting at age 32 if you have a 30-pack-year history of smoking and currently smoke or have quit within the past 15 years. Fecal occult blood test (FOBT) of the stool. You may have this test every year starting at age 19. Flexible sigmoidoscopy or colonoscopy. You may have a sigmoidoscopy every 5 years or a colonoscopy every 10 years starting at age 103. Hepatitis C blood test. Hepatitis B blood test. Sexually transmitted disease (STD) testing. Diabetes screening. This is done by checking your blood sugar (glucose) after you have not eaten for a while (fasting). You may have this done every 1-3 years. Bone density scan. This is done to screen for osteoporosis. You may have this done starting at age 49. Mammogram. This may be done every 1-2 years. Talk to your health care provider about how often you should have regular mammograms. Talk with your health care provider about your test results, treatment options, and if necessary, the need for more tests. Vaccines  Your health care provider may recommend certain vaccines, such as: Influenza vaccine. This is recommended every year. Tetanus, diphtheria, and acellular pertussis (Tdap, Td) vaccine. You may need a Td booster every 10 years. Zoster vaccine. You may need this after age 21. Pneumococcal 13-valent conjugate (PCV13) vaccine. One dose is recommended after age 84. Pneumococcal polysaccharide (PPSV23) vaccine. One dose is recommended after age 68. Talk to your health care provider about  which screenings and vaccines you need and how  often you need them. This information is not intended to replace advice given to you by your health care provider. Make sure you discuss any questions you have with your health care provider. Document Released: 05/21/2015 Document Revised: 01/12/2016 Document Reviewed: 02/23/2015 Elsevier Interactive Patient Education  2017 [place] Prevention in the Home Falls can cause injuries. They can happen to people of all ages. There are many things you can do to make your home safe and to help prevent falls. What can I do on the outside of my home? Regularly fix the edges of walkways and driveways and fix any cracks. Remove anything that might make you trip as you walk through a door, such as a raised step or threshold. Trim any bushes or trees on the path to your home. Use bright outdoor lighting. Clear any walking paths of anything that might make someone trip, such as rocks or tools. Regularly check to see if handrails are loose or broken. Make sure that both sides of any steps have handrails. Any raised decks and porches should have guardrails on the edges. Have any leaves, snow, or ice cleared regularly. Use sand or salt on walking paths during winter. Clean up any spills in your garage right away. This includes oil or grease spills. What can I do in the bathroom? Use night lights. Install grab bars by the toilet and in the tub and shower. Do not use towel bars as grab bars. Use non-skid mats or decals in the tub or shower. If you need to sit down in the shower, use a plastic, non-slip stool. Keep the floor dry. Clean up any water that spills on the floor as soon as it happens. Remove soap buildup in the tub or shower regularly. Attach bath mats securely with double-sided non-slip rug tape. Do not have throw rugs and other things on the floor that can make you trip. What can I do in the bedroom? Use night lights. Make sure that you have a light by your bed that is easy to  reach. Do not use any sheets or blankets that are too big for your bed. They should not hang down onto the floor. Have a firm chair that has side arms. You can use this for support while you get dressed. Do not have throw rugs and other things on the floor that can make you trip. What can I do in the kitchen? Clean up any spills right away. Avoid walking on wet floors. Keep items that you use a lot in easy-to-reach places. If you need to reach something above you, use a strong step stool that has a grab bar. Keep electrical cords out of the way. Do not use floor polish or wax that makes floors slippery. If you must use wax, use non-skid floor wax. Do not have throw rugs and other things on the floor that can make you trip. What can I do with my stairs? Do not leave any items on the stairs. Make sure that there are handrails on both sides of the stairs and use them. Fix handrails that are broken or loose. Make sure that handrails are as long as the stairways. Check any carpeting to make sure that it is firmly attached to the stairs. Fix any carpet that is loose or worn. Avoid having throw rugs at the top or bottom of the stairs. If you do have throw rugs, attach them to the floor with  carpet tape. Make sure that you have a light switch at the top of the stairs and the bottom of the stairs. If you do not have them, ask someone to add them for you. What else can I do to help prevent falls? Wear shoes that: Do not have high heels. Have rubber bottoms. Are comfortable and fit you well. Are closed at the toe. Do not wear sandals. If you use a stepladder: Make sure that it is fully opened. Do not climb a closed stepladder. Make sure that both sides of the stepladder are locked into place. Ask someone to hold it for you, if possible. Clearly mark and make sure that you can see: Any grab bars or handrails. First and last steps. Where the edge of each step is. Use tools that help you move  around (mobility aids) if they are needed. These include: Canes. Walkers. Scooters. Crutches. Turn on the lights when you go into a dark area. Replace any light bulbs as soon as they burn out. Set up your furniture so you have a clear path. Avoid moving your furniture around. If any of your floors are uneven, fix them. If there are any pets around you, be aware of where they are. Review your medicines with your doctor. Some medicines can make you feel dizzy. This can increase your chance of falling. Ask your doctor what other things that you can do to help prevent falls. This information is not intended to replace advice given to you by your health care provider. Make sure you discuss any questions you have with your health care provider. Document Released: 02/18/2009 Document Revised: 09/30/2015 Document Reviewed: 05/29/2014 Elsevier Interactive Patient Education  2017 [employer].

## 2020-12-08 DIAGNOSIS — M9902 Segmental and somatic dysfunction of thoracic region: Secondary | ICD-10-CM | POA: Diagnosis not present

## 2020-12-08 DIAGNOSIS — M5134 Other intervertebral disc degeneration, thoracic region: Secondary | ICD-10-CM | POA: Diagnosis not present

## 2020-12-08 DIAGNOSIS — S138XXA Sprain of joints and ligaments of other parts of neck, initial encounter: Secondary | ICD-10-CM | POA: Diagnosis not present

## 2020-12-08 DIAGNOSIS — M9901 Segmental and somatic dysfunction of cervical region: Secondary | ICD-10-CM | POA: Diagnosis not present

## 2020-12-14 ENCOUNTER — Telehealth: Payer: Medicare HMO | Admitting: Family Medicine

## 2021-01-13 DIAGNOSIS — M9902 Segmental and somatic dysfunction of thoracic region: Secondary | ICD-10-CM | POA: Diagnosis not present

## 2021-01-13 DIAGNOSIS — M5134 Other intervertebral disc degeneration, thoracic region: Secondary | ICD-10-CM | POA: Diagnosis not present

## 2021-01-13 DIAGNOSIS — S138XXA Sprain of joints and ligaments of other parts of neck, initial encounter: Secondary | ICD-10-CM | POA: Diagnosis not present

## 2021-01-13 DIAGNOSIS — M9901 Segmental and somatic dysfunction of cervical region: Secondary | ICD-10-CM | POA: Diagnosis not present

## 2021-01-18 ENCOUNTER — Other Ambulatory Visit: Payer: Self-pay | Admitting: Internal Medicine

## 2021-01-21 DIAGNOSIS — M5134 Other intervertebral disc degeneration, thoracic region: Secondary | ICD-10-CM | POA: Diagnosis not present

## 2021-01-21 DIAGNOSIS — M9901 Segmental and somatic dysfunction of cervical region: Secondary | ICD-10-CM | POA: Diagnosis not present

## 2021-01-21 DIAGNOSIS — M9902 Segmental and somatic dysfunction of thoracic region: Secondary | ICD-10-CM | POA: Diagnosis not present

## 2021-01-21 DIAGNOSIS — S138XXA Sprain of joints and ligaments of other parts of neck, initial encounter: Secondary | ICD-10-CM | POA: Diagnosis not present

## 2021-02-04 DIAGNOSIS — M5134 Other intervertebral disc degeneration, thoracic region: Secondary | ICD-10-CM | POA: Diagnosis not present

## 2021-02-04 DIAGNOSIS — S138XXA Sprain of joints and ligaments of other parts of neck, initial encounter: Secondary | ICD-10-CM | POA: Diagnosis not present

## 2021-02-04 DIAGNOSIS — M9902 Segmental and somatic dysfunction of thoracic region: Secondary | ICD-10-CM | POA: Diagnosis not present

## 2021-02-04 DIAGNOSIS — M9901 Segmental and somatic dysfunction of cervical region: Secondary | ICD-10-CM | POA: Diagnosis not present

## 2021-02-14 ENCOUNTER — Other Ambulatory Visit: Payer: Self-pay | Admitting: Internal Medicine

## 2021-02-25 DIAGNOSIS — M9901 Segmental and somatic dysfunction of cervical region: Secondary | ICD-10-CM | POA: Diagnosis not present

## 2021-02-25 DIAGNOSIS — M9902 Segmental and somatic dysfunction of thoracic region: Secondary | ICD-10-CM | POA: Diagnosis not present

## 2021-02-25 DIAGNOSIS — M5134 Other intervertebral disc degeneration, thoracic region: Secondary | ICD-10-CM | POA: Diagnosis not present

## 2021-02-25 DIAGNOSIS — S138XXA Sprain of joints and ligaments of other parts of neck, initial encounter: Secondary | ICD-10-CM | POA: Diagnosis not present

## 2021-03-12 ENCOUNTER — Other Ambulatory Visit: Payer: Self-pay | Admitting: Internal Medicine

## 2021-03-22 ENCOUNTER — Ambulatory Visit: Payer: Medicare HMO | Admitting: Internal Medicine

## 2021-03-23 NOTE — Progress Notes (Signed)
Chief Complaint  Patient presents with   Medication Refill    HPI: Jacqueline Myers 70 y.o. come in for Chronic disease management  missed appt earlier this week caretaking for her 63 year old mother who is in hospice care most recently declining.  Hypertension needs refill of medication seems to be controlled no side effects HLD needs refill of Crestor denies side effects Thyroid taking a half a pill without difficulty daily 25 mcg Citalopram taking 15 mg equivalent seems to be doing well wants to continue Rare use of lorazepam has a bottle mostly expired will call if needs refill. Had COVID-19 infection in August asks about booster. Has had flu shot will need second shingles vaccine after the first of the year. Back problem off and on sometimes takes Flexeril at night for sleep.  Ongoing cough for over a year thinks it is related to her old smoking but no asthma feels pretty well had chest x-ray and cough up phlegm.  Last year had 29 to 30 years of tobacco stopped in 2000 no specific shortness of breath had PFTs in the remote past was okay.  ROS: See pertinent positives and negatives per HPI.  Past Medical History:  Diagnosis Date   Colon polyps    hyperplastic   Diverticulosis    Esophageal stricture    GERD (gastroesophageal reflux disease)    History of bronchitis    Hyperlipidemia    Hypertension    Mitral valve prolapse    Osteoarthritis    Osteoporosis    Spinal stenosis     Family History  Problem Relation Age of Onset   Breast cancer Sister    Alcohol abuse Mother    Stroke Mother    Hypertension Mother    Diabetes Mother    Hyperlipidemia Mother    Hyperlipidemia Father    Colon cancer Neg Hx     Social History   Socioeconomic History   Marital status: Married    Spouse name: Not on file   Number of children: 2   Years of education: Not on file   Highest education level: Not on file  Occupational History   Occupation: Retired  Tobacco Use    Smoking status: Former    Packs/day: 1.00    Years: 29.00    Pack years: 29.00    Types: Cigarettes    Quit date: 05/09/1999    Years since quitting: 21.8   Smokeless tobacco: Never  Vaping Use   Vaping Use: Never used  Substance and Sexual Activity   Alcohol use: Yes    Alcohol/week: 0.0 standard drinks    Comment: OCC- [place] [facility] WINE   Drug use: No   Sexual activity: Not on file  Other Topics Concern   Not on file  Social History Narrative   Married   HH of 2 helps take care 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   Neg td neg ets   Social Determinants of Health   Financial Resource Strain: Low Risk    Difficulty of Paying Living Expenses: Not hard at all  Food Insecurity: No Food Insecurity   Worried About Charity fundraiser in the Last Year: Never true   [place] in the Last Year: Never true  Transportation Needs: No Transportation Needs   Lack of Transportation (Medical): No   Lack of  Transportation (Non-Medical): No  Physical Activity: Inactive   Days of Exercise per Week: 0 days   Minutes of Exercise per Session: 0 min  Stress: No Stress Concern Present   Feeling of Stress : Not at all  Social Connections: Moderately Integrated   Frequency of Communication with Friends and Family: Three times a week   Frequency of Social Gatherings with Friends and Family: Three times a week   Attends Religious Services: More than 4 times per year   Active Member of Clubs or Organizations: No   Attends Archivist Meetings: Never   Marital Status: Married    Outpatient Medications Prior to Visit  Medication Sig Dispense Refill   acetaminophen (TYLENOL) 500 MG tablet Take 500-1,000 mg by mouth every 6 (six) hours as needed for moderate pain.     cyclobenzaprine (FLEXERIL) 10 MG tablet TAKE 1/2 TO 1 (ONE-HALF TO ONE) TABLET BY MOUTH THREE  TIMES DAILY AS NEEDED FOR MUSCLE SPASM 30 tablet 0   diclofenac sodium (VOLTAREN) 1 % GEL Apply 4 g topically 4 (four) times daily. 100 g 2   LORazepam (ATIVAN) 1 MG tablet Take 0.5-1 tablets (0.5-1 mg total) by mouth every 8 (eight) hours as needed for anxiety. 15 tablet 0   Omega-3 Fatty Acids (FISH OIL) 1200 MG CPDR Take 2 capsules by mouth daily.     valACYclovir (VALTREX) 1000 MG tablet TAKE 1 TABLET BY MOUTH THREE TIMES DAILYAS DIRECTED 30 tablet 0   Candesartan Cilexetil-HCTZ 32-25 MG TABS TAKE 1 TABLET BY MOUTH ONCE DAILY . APPOINTMENT REQUIRED FOR FUTURE REFILLS 30 tablet 0   citalopram (CELEXA) 10 MG tablet TAKE 1 & 1/2 (ONE & ONE-HALF) TABLETS BY MOUTH ONCE DAILY . APPOINTMENT REQUIRED FOR FUTURE REFILLS 45 tablet 0   doxycycline (VIBRA-TABS) 100 MG tablet Take 1 tablet (100 mg total) by mouth 2 (two) times daily. 20 tablet 0   levothyroxine (SYNTHROID) 50 MCG tablet TAKE 1/2 (ONE-HALF) TABLET BY MOUTH ONCE DAILY . APPOINTMENT REQUIRED FOR FUTURE REFILLS 30 tablet 0   omeprazole (PRILOSEC) 20 MG capsule TAKE 1 CAPSULE BY MOUTH ONCE DAILY . APPOINTMENT REQUIRED FOR FUTURE REFILLS 30 capsule 0   rosuvastatin (CRESTOR) 5 MG tablet TAKE 1 TABLET BY MOUTH ONCE DAILY . APPOINTMENT REQUIRED FOR FUTURE REFILLS 30 tablet 0   No facility-administered medications prior to visit.     EXAM:  BP 118/60 (BP Location: Left Arm, Patient Position: Sitting, Cuff Size: Normal)   Pulse 84   Temp 98.7 F (37.1 C) (Oral)   Ht 5\' 3"  (1.6 m)   Wt 143 lb 3.2 oz (65 kg)   SpO2 96%   BMI 25.37 kg/m   Body mass index is 25.37 kg/m.  GENERAL: vitals reviewed and listed above, alert, oriented, appears well hydrated and in no acute distress HEENT: atraumatic, conjunctiva  clear, no obvious abnormalities on inspection of external nose and ears OP : Masked NECK: no obvious masses on inspection palpation  LUNGS: clear to auscultation bilaterally, no wheezes, rales or rhonchi, good air movement CV: HRRR, no  clubbing cyanosis or  peripheral edema nl cap refill  Abdomen soft without again a megaly guarding or rebound MS: moves all extremities without noticeable focal  abnormality PSYCH: pleasant and cooperative, no obvious depression or anxiety Lab Results  Component Value Date   WBC 6.8 09/24/2019   HGB 13.2 09/24/2019   HCT 39.5 09/24/2019   PLT 268.0 09/24/2019   GLUCOSE 97 09/24/2019   CHOL 172 09/24/2019  TRIG 122.0 09/24/2019   HDL 53.90 09/24/2019   LDLDIRECT 127.6 06/23/2013   LDLCALC 94 09/24/2019   ALT 17 09/24/2019   AST 19 09/24/2019   NA 140 09/24/2019   K 4.2 09/24/2019   CL 103 09/24/2019   CREATININE 0.84 09/24/2019   BUN 16 09/24/2019   CO2 28 09/24/2019   TSH 3.54 09/24/2019   INR 0.93 04/08/2015   HGBA1C 5.9 09/24/2019   BP Readings from Last 3 Encounters:  03/24/21 118/60  08/27/20 (!) 104/53  10/02/19 139/63   Ate last night .  So fasting lab plan reviewed with patient ASSESSMENT AND PLAN:  Discussed the following assessment and plan:  Medication management - Plan: Basic metabolic panel, CBC with Differential/Platelet, Hepatic function panel, Lipid panel, TSH, T4, free, Hemoglobin A1c, DG Chest 2 View, Hemoglobin A1c, T4, free, TSH, Lipid panel, Hepatic function panel, CBC with Differential/Platelet, Basic metabolic panel  Hyperlipidemia, unspecified hyperlipidemia type - Plan: Basic metabolic panel, CBC with Differential/Platelet, Hepatic function panel, Lipid panel, TSH, T4, free, Hemoglobin A1c, DG Chest 2 View, Hemoglobin A1c, T4, free, TSH, Lipid panel, Hepatic function panel, CBC with Differential/Platelet, Basic metabolic panel  Hypothyroidism, unspecified type - Plan: Basic metabolic panel, CBC with Differential/Platelet, Hepatic function panel, Lipid panel, TSH, T4, free, Hemoglobin A1c, DG Chest 2 View, Hemoglobin A1c, T4, free, TSH, Lipid panel, Hepatic function panel, CBC with Differential/Platelet, Basic metabolic panel  Essential hypertension  - Plan: Basic metabolic panel, CBC with Differential/Platelet, Hepatic function panel, Lipid panel, TSH, T4, free, Hemoglobin A1c, DG Chest 2 View, Hemoglobin A1c, T4, free, TSH, Lipid panel, Hepatic function panel, CBC with Differential/Platelet, Basic metabolic panel  Gastroesophageal reflux disease, unspecified whether esophagitis present - Plan: Basic metabolic panel, CBC with Differential/Platelet, Hepatic function panel, Lipid panel, TSH, T4, free, Hemoglobin A1c, DG Chest 2 View, Hemoglobin A1c, T4, free, TSH, Lipid panel, Hepatic function panel, CBC with Differential/Platelet, Basic metabolic panel  Fasting hyperglycemia - Plan: Basic metabolic panel, CBC with Differential/Platelet, Hepatic function panel, Lipid panel, TSH, T4, free, Hemoglobin A1c, DG Chest 2 View, Hemoglobin A1c, T4, free, TSH, Lipid panel, Hepatic function panel, CBC with Differential/Platelet, Basic metabolic panel  Cough, persistent - Plan: Basic metabolic panel, CBC with Differential/Platelet, Hepatic function panel, Lipid panel, TSH, T4, free, Hemoglobin A1c, DG Chest 2 View, Hemoglobin A1c, T4, free, TSH, Lipid panel, Hepatic function panel, CBC with Differential/Platelet, Basic metabolic panel  History of tobacco use - stopped 2000 Overall disease states appear to be managed and controlled due for lab work Get updated chest x-ray today is in a intense caretaking role at this time.  Plan follow-up depending or 6 months. -Patient advised to return or notify health care team  if  new concerns arise.  Patient Instructions  Good to see you today   Will   refill   meds 90 days as discussed  Lab and x ray today    Jacqueline Myers K. Adalynn Corne M.D.

## 2021-03-24 ENCOUNTER — Ambulatory Visit (INDEPENDENT_AMBULATORY_CARE_PROVIDER_SITE_OTHER): Payer: Medicare HMO

## 2021-03-24 ENCOUNTER — Ambulatory Visit (INDEPENDENT_AMBULATORY_CARE_PROVIDER_SITE_OTHER): Payer: Medicare HMO | Admitting: Internal Medicine

## 2021-03-24 ENCOUNTER — Encounter: Payer: Self-pay | Admitting: Internal Medicine

## 2021-03-24 ENCOUNTER — Other Ambulatory Visit: Payer: Self-pay

## 2021-03-24 VITALS — BP 118/60 | HR 84 | Temp 98.7°F | Ht 63.0 in | Wt 143.2 lb

## 2021-03-24 DIAGNOSIS — J439 Emphysema, unspecified: Secondary | ICD-10-CM | POA: Diagnosis not present

## 2021-03-24 DIAGNOSIS — E785 Hyperlipidemia, unspecified: Secondary | ICD-10-CM

## 2021-03-24 DIAGNOSIS — R053 Chronic cough: Secondary | ICD-10-CM | POA: Diagnosis not present

## 2021-03-24 DIAGNOSIS — Z87891 Personal history of nicotine dependence: Secondary | ICD-10-CM | POA: Diagnosis not present

## 2021-03-24 DIAGNOSIS — R9389 Abnormal findings on diagnostic imaging of other specified body structures: Secondary | ICD-10-CM

## 2021-03-24 DIAGNOSIS — E039 Hypothyroidism, unspecified: Secondary | ICD-10-CM

## 2021-03-24 DIAGNOSIS — I10 Essential (primary) hypertension: Secondary | ICD-10-CM | POA: Diagnosis not present

## 2021-03-24 DIAGNOSIS — K219 Gastro-esophageal reflux disease without esophagitis: Secondary | ICD-10-CM | POA: Diagnosis not present

## 2021-03-24 DIAGNOSIS — R7301 Impaired fasting glucose: Secondary | ICD-10-CM

## 2021-03-24 DIAGNOSIS — Z79899 Other long term (current) drug therapy: Secondary | ICD-10-CM | POA: Diagnosis not present

## 2021-03-24 LAB — CBC WITH DIFFERENTIAL/PLATELET
Basophils Absolute: 0 10*3/uL (ref 0.0–0.1)
Basophils Relative: 0.4 % (ref 0.0–3.0)
Eosinophils Absolute: 0.3 10*3/uL (ref 0.0–0.7)
Eosinophils Relative: 5.5 % — ABNORMAL HIGH (ref 0.0–5.0)
HCT: 40.4 % (ref 36.0–46.0)
Hemoglobin: 13.4 g/dL (ref 12.0–15.0)
Lymphocytes Relative: 27.5 % (ref 12.0–46.0)
Lymphs Abs: 1.6 10*3/uL (ref 0.7–4.0)
MCHC: 33.3 g/dL (ref 30.0–36.0)
MCV: 87.3 fl (ref 78.0–100.0)
Monocytes Absolute: 0.6 10*3/uL (ref 0.1–1.0)
Monocytes Relative: 10.2 % (ref 3.0–12.0)
Neutro Abs: 3.3 10*3/uL (ref 1.4–7.7)
Neutrophils Relative %: 56.4 % (ref 43.0–77.0)
Platelets: 270 10*3/uL (ref 150.0–400.0)
RBC: 4.62 Mil/uL (ref 3.87–5.11)
RDW: 14.1 % (ref 11.5–15.5)
WBC: 5.9 10*3/uL (ref 4.0–10.5)

## 2021-03-24 LAB — BASIC METABOLIC PANEL
BUN: 14 mg/dL (ref 6–23)
CO2: 32 mEq/L (ref 19–32)
Calcium: 9.4 mg/dL (ref 8.4–10.5)
Chloride: 98 mEq/L (ref 96–112)
Creatinine, Ser: 0.88 mg/dL (ref 0.40–1.20)
GFR: 66.16 mL/min (ref >60.00)
Glucose, Bld: 92 mg/dL (ref 70–99)
Potassium: 3.9 mEq/L (ref 3.5–5.1)
Sodium: 137 mEq/L (ref 135–145)

## 2021-03-24 LAB — HEPATIC FUNCTION PANEL
ALT: 19 U/L (ref 0–35)
AST: 19 U/L (ref 0–37)
Albumin: 4.5 g/dL (ref 3.5–5.2)
Alkaline Phosphatase: 80 U/L (ref 39–117)
Bilirubin, Direct: 0.1 mg/dL (ref 0.0–0.3)
Total Bilirubin: 0.5 mg/dL (ref 0.2–1.2)
Total Protein: 7.1 g/dL (ref 6.0–8.3)

## 2021-03-24 LAB — LIPID PANEL
Cholesterol: 192 mg/dL (ref 0–200)
HDL: 54.1 mg/dL (ref >39.00)
LDL Cholesterol: 101 mg/dL — ABNORMAL HIGH (ref 0–99)
NonHDL: 138.14
Total CHOL/HDL Ratio: 4
Triglycerides: 187 mg/dL — ABNORMAL HIGH (ref 0.0–149.0)
VLDL: 37.4 mg/dL (ref 0.0–40.0)

## 2021-03-24 LAB — T4, FREE: Free T4: 0.79 ng/dL (ref 0.60–1.60)

## 2021-03-24 LAB — TSH: TSH: 3.27 u[IU]/mL (ref 0.35–5.50)

## 2021-03-24 LAB — HEMOGLOBIN A1C: Hgb A1c MFr Bld: 6.1 % (ref 4.6–6.5)

## 2021-03-24 MED ORDER — CANDESARTAN CILEXETIL-HCTZ 32-25 MG PO TABS: ORAL_TABLET | ORAL | 3 refills | Status: DC

## 2021-03-24 MED ORDER — CITALOPRAM HYDROBROMIDE 10 MG PO TABS: ORAL_TABLET | ORAL | 3 refills | Status: DC

## 2021-03-24 MED ORDER — OMEPRAZOLE 20 MG PO CPDR: DELAYED_RELEASE_CAPSULE | ORAL | 3 refills | Status: DC

## 2021-03-24 MED ORDER — ROSUVASTATIN CALCIUM 5 MG PO TABS: ORAL_TABLET | ORAL | 3 refills | Status: DC

## 2021-03-24 MED ORDER — LEVOTHYROXINE SODIUM 50 MCG PO TABS: ORAL_TABLET | ORAL | 3 refills | Status: DC

## 2021-03-24 NOTE — Patient Instructions (Addendum)
Good to see you today   Will   refill   meds 90 days as discussed  Lab and x ray today

## 2021-03-25 DIAGNOSIS — M5134 Other intervertebral disc degeneration, thoracic region: Secondary | ICD-10-CM | POA: Diagnosis not present

## 2021-03-25 DIAGNOSIS — M9901 Segmental and somatic dysfunction of cervical region: Secondary | ICD-10-CM | POA: Diagnosis not present

## 2021-03-25 DIAGNOSIS — S138XXA Sprain of joints and ligaments of other parts of neck, initial encounter: Secondary | ICD-10-CM | POA: Diagnosis not present

## 2021-03-25 DIAGNOSIS — M9902 Segmental and somatic dysfunction of thoracic region: Secondary | ICD-10-CM | POA: Diagnosis not present

## 2021-03-25 MED ORDER — DOXYCYCLINE HYCLATE 100 MG PO TABS: 100.0000 mg | ORAL_TABLET | Freq: Two times a day (BID) | ORAL | 0 refills | Status: DC

## 2021-03-25 NOTE — Progress Notes (Signed)
Results stable  except cholesterol up some from last year  Attention to lifestyle intervention healthy eating and activity as possible.  Advise  6 mos check .  Also we need fu chest x ray .  See x ray note. Marland Kitchen

## 2021-03-25 NOTE — Progress Notes (Signed)
X ray l area  r side :   could be a chronic infection or scarring   Since you have had   a cough  for a year   I advise   order a chest  lung ct  dx abnormal chest x ray and cough , ex smoker ( non urgent  )    In the interim Add  doxycyline 100 mg 1 po bid for   7 days  in case infection  causing  the findings .( Mykal please send in antibiotic and order  chest ct without  thanks)

## 2021-04-15 ENCOUNTER — Other Ambulatory Visit: Payer: Self-pay | Admitting: Internal Medicine

## 2021-04-20 ENCOUNTER — Ambulatory Visit
Admission: RE | Admit: 2021-04-20 | Discharge: 2021-04-20 | Disposition: A | Discharge status: Home / Self Care | Payer: Medicare HMO | Source: Ambulatory Visit | Attending: Internal Medicine | Admitting: Internal Medicine

## 2021-04-20 ENCOUNTER — Other Ambulatory Visit: Payer: Self-pay

## 2021-04-20 DIAGNOSIS — J439 Emphysema, unspecified: Secondary | ICD-10-CM | POA: Diagnosis not present

## 2021-04-20 DIAGNOSIS — R053 Chronic cough: Secondary | ICD-10-CM

## 2021-04-20 DIAGNOSIS — R9389 Abnormal findings on diagnostic imaging of other specified body structures: Secondary | ICD-10-CM

## 2021-04-20 DIAGNOSIS — Z87891 Personal history of nicotine dependence: Secondary | ICD-10-CM

## 2021-04-20 DIAGNOSIS — I700 Atherosclerosis of aorta: Secondary | ICD-10-CM | POA: Diagnosis not present

## 2021-04-26 NOTE — Progress Notes (Signed)
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  Which is an incidental finding (The rosuvastatin and should reduce progression of the plaque buildup)

## 2021-05-13 DIAGNOSIS — M5134 Other intervertebral disc degeneration, thoracic region: Secondary | ICD-10-CM | POA: Diagnosis not present

## 2021-05-13 DIAGNOSIS — M9901 Segmental and somatic dysfunction of cervical region: Secondary | ICD-10-CM | POA: Diagnosis not present

## 2021-05-13 DIAGNOSIS — M9902 Segmental and somatic dysfunction of thoracic region: Secondary | ICD-10-CM | POA: Diagnosis not present

## 2021-05-13 DIAGNOSIS — S138XXA Sprain of joints and ligaments of other parts of neck, initial encounter: Secondary | ICD-10-CM | POA: Diagnosis not present

## 2021-05-23 ENCOUNTER — Encounter: Payer: Self-pay | Admitting: Internal Medicine

## 2021-06-03 DIAGNOSIS — S138XXA Sprain of joints and ligaments of other parts of neck, initial encounter: Secondary | ICD-10-CM | POA: Diagnosis not present

## 2021-06-03 DIAGNOSIS — M9902 Segmental and somatic dysfunction of thoracic region: Secondary | ICD-10-CM | POA: Diagnosis not present

## 2021-06-03 DIAGNOSIS — M9901 Segmental and somatic dysfunction of cervical region: Secondary | ICD-10-CM | POA: Diagnosis not present

## 2021-06-03 DIAGNOSIS — M5134 Other intervertebral disc degeneration, thoracic region: Secondary | ICD-10-CM | POA: Diagnosis not present

## 2021-06-14 DIAGNOSIS — M19071 Primary osteoarthritis, right ankle and foot: Secondary | ICD-10-CM | POA: Diagnosis not present

## 2021-06-14 DIAGNOSIS — B351 Tinea unguium: Secondary | ICD-10-CM | POA: Diagnosis not present

## 2021-06-14 DIAGNOSIS — M19072 Primary osteoarthritis, left ankle and foot: Secondary | ICD-10-CM | POA: Diagnosis not present

## 2021-06-24 DIAGNOSIS — B351 Tinea unguium: Secondary | ICD-10-CM | POA: Diagnosis not present

## 2021-06-24 DIAGNOSIS — S138XXA Sprain of joints and ligaments of other parts of neck, initial encounter: Secondary | ICD-10-CM | POA: Diagnosis not present

## 2021-06-24 DIAGNOSIS — M5134 Other intervertebral disc degeneration, thoracic region: Secondary | ICD-10-CM | POA: Diagnosis not present

## 2021-06-24 DIAGNOSIS — L608 Other nail disorders: Secondary | ICD-10-CM | POA: Diagnosis not present

## 2021-06-24 DIAGNOSIS — M9901 Segmental and somatic dysfunction of cervical region: Secondary | ICD-10-CM | POA: Diagnosis not present

## 2021-06-24 DIAGNOSIS — M9902 Segmental and somatic dysfunction of thoracic region: Secondary | ICD-10-CM | POA: Diagnosis not present

## 2021-06-24 DIAGNOSIS — L603 Nail dystrophy: Secondary | ICD-10-CM | POA: Diagnosis not present

## 2021-07-05 DIAGNOSIS — B353 Tinea pedis: Secondary | ICD-10-CM | POA: Diagnosis not present

## 2021-07-05 DIAGNOSIS — Z79899 Other long term (current) drug therapy: Secondary | ICD-10-CM | POA: Diagnosis not present

## 2021-07-05 DIAGNOSIS — B351 Tinea unguium: Secondary | ICD-10-CM | POA: Diagnosis not present

## 2021-07-17 ENCOUNTER — Encounter (HOSPITAL_BASED_OUTPATIENT_CLINIC_OR_DEPARTMENT_OTHER): Payer: Self-pay | Admitting: Obstetrics and Gynecology

## 2021-07-17 ENCOUNTER — Emergency Department (HOSPITAL_BASED_OUTPATIENT_CLINIC_OR_DEPARTMENT_OTHER): Payer: Medicare HMO

## 2021-07-17 ENCOUNTER — Emergency Department (HOSPITAL_BASED_OUTPATIENT_CLINIC_OR_DEPARTMENT_OTHER)
Admission: EM | Admit: 2021-07-17 | Discharge: 2021-07-17 | Disposition: A | Discharge status: Home / Self Care | Payer: Medicare HMO | Attending: Emergency Medicine | Admitting: Emergency Medicine

## 2021-07-17 ENCOUNTER — Other Ambulatory Visit: Payer: Self-pay

## 2021-07-17 DIAGNOSIS — R112 Nausea with vomiting, unspecified: Secondary | ICD-10-CM | POA: Diagnosis not present

## 2021-07-17 DIAGNOSIS — Z20822 Contact with and (suspected) exposure to covid-19: Secondary | ICD-10-CM | POA: Diagnosis not present

## 2021-07-17 DIAGNOSIS — R531 Weakness: Secondary | ICD-10-CM | POA: Diagnosis not present

## 2021-07-17 DIAGNOSIS — E860 Dehydration: Secondary | ICD-10-CM | POA: Diagnosis not present

## 2021-07-17 DIAGNOSIS — R1032 Left lower quadrant pain: Secondary | ICD-10-CM | POA: Diagnosis not present

## 2021-07-17 DIAGNOSIS — K449 Diaphragmatic hernia without obstruction or gangrene: Secondary | ICD-10-CM | POA: Diagnosis not present

## 2021-07-17 DIAGNOSIS — R197 Diarrhea, unspecified: Secondary | ICD-10-CM

## 2021-07-17 DIAGNOSIS — R110 Nausea: Secondary | ICD-10-CM

## 2021-07-17 DIAGNOSIS — K5730 Diverticulosis of large intestine without perforation or abscess without bleeding: Secondary | ICD-10-CM | POA: Diagnosis not present

## 2021-07-17 LAB — CBC
HCT: 41 % (ref 36.0–46.0)
Hemoglobin: 13.9 g/dL (ref 12.0–15.0)
MCH: 28.7 pg (ref 26.0–34.0)
MCHC: 33.9 g/dL (ref 30.0–36.0)
MCV: 84.7 fL (ref 80.0–100.0)
Platelets: 273 10*3/uL (ref 150–400)
RBC: 4.84 MIL/uL (ref 3.87–5.11)
RDW: 13.2 % (ref 11.5–15.5)
WBC: 8.3 10*3/uL (ref 4.0–10.5)
nRBC: 0 % (ref 0.0–0.2)

## 2021-07-17 LAB — URINALYSIS, ROUTINE W REFLEX MICROSCOPIC
Bilirubin Urine: NEGATIVE
Glucose, UA: NEGATIVE mg/dL
Hgb urine dipstick: NEGATIVE
Ketones, ur: NEGATIVE mg/dL
Leukocytes,Ua: NEGATIVE
Nitrite: NEGATIVE
Specific Gravity, Urine: 1.013 (ref 1.005–1.030)
pH: 7.5 (ref 5.0–8.0)

## 2021-07-17 LAB — COMPREHENSIVE METABOLIC PANEL
ALT: 18 U/L (ref 0–44)
AST: 22 U/L (ref 15–41)
Albumin: 4.2 g/dL (ref 3.5–5.0)
Alkaline Phosphatase: 62 U/L (ref 38–126)
Anion gap: 12 (ref 5–15)
BUN: 9 mg/dL (ref 8–23)
CO2: 23 mmol/L (ref 22–32)
Calcium: 9.1 mg/dL (ref 8.9–10.3)
Chloride: 99 mmol/L (ref 98–111)
Creatinine, Ser: 0.77 mg/dL (ref 0.44–1.00)
GFR, Estimated: >60 mL/min (ref >60)
Glucose, Bld: 110 mg/dL — ABNORMAL HIGH (ref 70–99)
Potassium: 3.3 mmol/L — ABNORMAL LOW (ref 3.5–5.1)
Sodium: 134 mmol/L — ABNORMAL LOW (ref 135–145)
Total Bilirubin: 0.5 mg/dL (ref 0.3–1.2)
Total Protein: 7.1 g/dL (ref 6.5–8.1)

## 2021-07-17 LAB — RESP PANEL BY RT-PCR (FLU A&B, COVID) ARPGX2
Influenza A by PCR: NEGATIVE
Influenza B by PCR: NEGATIVE
SARS Coronavirus 2 by RT PCR: NEGATIVE

## 2021-07-17 LAB — LIPASE, BLOOD: Lipase: 34 U/L (ref 11–51)

## 2021-07-17 MED ORDER — IOHEXOL 300 MG/ML  SOLN
100.0000 mL | Freq: Once | INTRAMUSCULAR | Status: AC | PRN
  Administered 2021-07-17: 100 mL via INTRAVENOUS

## 2021-07-17 MED ORDER — ONDANSETRON HCL 4 MG/2ML IJ SOLN
4.0000 mg | Freq: Once | INTRAMUSCULAR | Status: AC
Start: 2021-07-17 — End: 2021-07-17
  Administered 2021-07-17: 4 mg via INTRAVENOUS
  Filled 2021-07-17: qty 2

## 2021-07-17 MED ORDER — ONDANSETRON 4 MG PO TBDP: 4.0000 mg | ORAL_TABLET | ORAL | 0 refills | Status: AC | PRN

## 2021-07-17 MED ORDER — ONDANSETRON HCL 4 MG/2ML IJ SOLN
4.0000 mg | Freq: Once | INTRAMUSCULAR | Status: AC | PRN
  Administered 2021-07-17: 4 mg via INTRAVENOUS
  Filled 2021-07-17: qty 2

## 2021-07-17 MED ORDER — LACTATED RINGERS IV BOLUS
1000.0000 mL | Freq: Once | INTRAVENOUS | Status: AC
Start: 2021-07-17 — End: 2021-07-17
  Administered 2021-07-17: 1000 mL via INTRAVENOUS

## 2021-07-17 NOTE — ED Notes (Signed)
Pt verbalizes understanding of discharge instructions. Opportunity for questioning and answers were provided. Pt discharged from ED to home with husband.    

## 2021-07-17 NOTE — ED Triage Notes (Signed)
Patient reports she was taking an antifungal medication for toenail fungus and reports she started feeling weaker and now has hypotension. Patient reports negative home tests for COVID.  ?

## 2021-07-17 NOTE — ED Provider Notes (Signed)
[facility] EMERGENCY DEPT Provider Note   CSN: [id number] Arrival date & time: 07/17/21  1521     History  Chief Complaint  Patient presents with   Weakness    Jacqueline Myers is a 71 y.o. female.  HPI Patient reports started feeling achy and fatigued 4 days ago.  She reports she had a little bit of chills and thought she might have a low-grade fever.  She thought the symptoms might be due to an antifungal medication she is taking for her toenails.  She stopped taking the medication and thought it might be better by about Saturday.  By Saturday she was not feeling any better.  She reports she had a low-grade temperature up to 100.4.  She continued to feel very fatigued achy and nauseated.  She did not develop any vomiting.  She does report however she developed some copious diarrhea over the past 2 days.  No blood in the stool.  She also then developed abdominal pain.  She reports she is having a fair amount of left lower abdominal pain that can be sharp and achy and crampy.  No pain burning urgency with urination.  No skin rashes.  No swelling or pain of the extremities.  No sick contacts that she is aware of.    Home Medications Prior to Admission medications   Medication Sig Start Date End Date Taking? Authorizing Provider  ondansetron (ZOFRAN-ODT) 4 MG disintegrating tablet Take 1 tablet (4 mg total) by mouth every 4 (four) hours as needed for nausea or vomiting. 07/17/21  Yes Charlesetta Shanks, MD  acetaminophen (TYLENOL) 500 MG tablet Take 500-1,000 mg by mouth every 6 (six) hours as needed for moderate pain.    [provider]  Candesartan Cilexetil-HCTZ 32-25 MG TABS TAKE 1 TABLET BY MOUTH ONCE DAILY . 03/24/21   Panosh, Standley Brooking, MD  citalopram (CELEXA) 10 MG tablet TAKE 1 & 1/2 (ONE & ONE-HALF) TABLETS BY MOUTH ONCE DAILY . 03/24/21   Panosh, Standley Brooking, MD  cyclobenzaprine (FLEXERIL) 10 MG tablet TAKE 1/2 TO 1 (ONE-HALF TO ONE) TABLET BY MOUTH THREE TIMES  DAILY AS NEEDED FOR MUSCLE SPASM 11/11/20   Panosh, Standley Brooking, MD  diclofenac sodium (VOLTAREN) 1 % GEL Apply 4 g topically 4 (four) times daily. 09/13/16   Panosh, Standley Brooking, MD  doxycycline (VIBRA-TABS) 100 MG tablet Take 1 tablet (100 mg total) by mouth 2 (two) times daily. 03/25/21   Panosh, Standley Brooking, MD  levothyroxine (SYNTHROID) 50 MCG tablet TAKE 1/2 (ONE-HALF) TABLET BY MOUTH ONCE DAILY . 03/24/21   Panosh, Standley Brooking, MD  LORazepam (ATIVAN) 1 MG tablet Take 0.5-1 tablets (0.5-1 mg total) by mouth every 8 (eight) hours as needed for anxiety. 06/20/16   Panosh, Standley Brooking, MD  Omega-3 Fatty Acids (FISH OIL) 1200 MG CPDR Take 2 capsules by mouth daily.    [provider]  omeprazole (PRILOSEC) 20 MG capsule TAKE 1 CAPSULE BY MOUTH ONCE DAILY 03/24/21   Panosh, Standley Brooking, MD  rosuvastatin (CRESTOR) 5 MG tablet TAKE 1 TABLET BY MOUTH ONCE DAILY . 03/24/21   Panosh, Standley Brooking, MD  valACYclovir (VALTREX) 1000 MG tablet TAKE 1 TABLET BY MOUTH THREE TIMES DAILY AS DIRECTED 04/15/21   Panosh, Standley Brooking, MD      Allergies    Morphine and related, Other, Statins, Lisinopril, Oxycodone, and Simvastatin    Review of Systems   Review of Systems 10 systems reviewed negative except as per HPI Physical Exam Updated  Vital Signs BP 113/73    Pulse 92    Temp 98.8 F (37.1 C)    Resp 15    SpO2 95%  Physical Exam  ED Results / Procedures / Treatments   Labs (all labs ordered are listed, but only abnormal results are displayed) Labs Reviewed  COMPREHENSIVE METABOLIC PANEL - Abnormal; Notable for the following components:      Result Value   Sodium 134 (*)    Potassium 3.3 (*)    Glucose, Bld 110 (*)    All other components within normal limits  URINALYSIS, ROUTINE W REFLEX MICROSCOPIC - Abnormal; Notable for the following components:   Protein, ur TRACE (*)    All other components within normal limits  RESP PANEL BY RT-PCR (FLU A&B, COVID) ARPGX2  LIPASE, BLOOD  CBC    EKG EKG  Interpretation  Date/Time:  Sunday July 17 2021 15:51:55 EDT Ventricular Rate:  91 PR Interval:  175 QRS Duration: 104 QT Interval:  368 QTC Calculation: 453 R Axis:   77 Text Interpretation: Sinus rhythm Low voltage, precordial leads no sig change from previous Confirmed by Charlesetta Shanks [phone]) on 07/17/2021 4:35:23 PM  Radiology CT Abdomen Pelvis W Contrast  Result Date: 07/17/2021 CLINICAL DATA:  Abdominal pain, on antifungal medication for toenail fungus EXAM: CT ABDOMEN AND PELVIS WITH CONTRAST TECHNIQUE: Multidetector CT imaging of the abdomen and pelvis was performed using the standard protocol following bolus administration of intravenous contrast. RADIATION DOSE REDUCTION: This exam was performed according to the departmental dose-optimization program which includes automated exposure control, adjustment of the mA and/or kV according to patient size and/or use of iterative reconstruction technique. CONTRAST:  19m OMNIPAQUE IOHEXOL 300 MG/ML  SOLN COMPARISON:  None. FINDINGS: Lower chest: No acute abnormality.  Small hiatal hernia. Hepatobiliary: No solid liver abnormality is seen. No gallstones, gallbladder wall thickening, or biliary dilatation. Pancreas: Unremarkable. No pancreatic ductal dilatation or surrounding inflammatory changes. Spleen: Normal in size without significant abnormality. Adrenals/Urinary Tract: Adrenal glands are unremarkable. Kidneys are normal, without renal calculi, solid lesion, or hydronephrosis. Bladder is unremarkable. Stomach/Bowel: Stomach is within normal limits. Small incidental diverticulum of the ascending portion of the duodenum. Appendix appears normal. No evidence of bowel wall thickening, distention, or inflammatory changes. Descending and sigmoid diverticulosis. Vascular/Lymphatic: Aortic atherosclerosis. No enlarged abdominal or pelvic lymph nodes. Reproductive: No mass or other significant abnormality. Other: No abdominal wall hernia or  abnormality. No ascites. Musculoskeletal: No acute or significant osseous findings. IMPRESSION: 1. No acute CT findings of the abdomen or pelvis to explain abdominal pain. 2. Descending and sigmoid diverticulosis without evidence of acute diverticulitis. 3. Small hiatal hernia. Aortic Atherosclerosis (ICD10-I70.0). Electronically Signed   By: ADelanna AhmadiM.D.   On: 07/17/2021 19:53    Procedures Procedures    Medications Ordered in ED Medications  ondansetron (ZOFRAN) injection 4 mg (4 mg Intravenous Given 07/17/21 1614)  lactated ringers bolus 1,000 mL (0 mLs Intravenous Stopped 07/17/21 1919)  iohexol (OMNIPAQUE) 300 MG/ML solution 100 mL (100 mLs Intravenous Contrast Given 07/17/21 1921)  ondansetron (ZOFRAN) injection 4 mg (4 mg Intravenous Given 07/17/21 2051)    ED Course/ Medical Decision Making/ A&P                           Medical Decision Making Amount and/or Complexity of Data Reviewed Labs: ordered. Radiology: ordered.  Risk Prescription drug management.   Patient reports she had gradual onset of symptoms.  However  over the past several days she developed diarrhea and then also fairly sharp left lower quadrant pain.  Patient endorse some pain to palpation in the left lower quadrant.  We will proceed with broad diagnostic evaluation for possible viral versus bacterial illness and CT scan to rule out diverticulitis.  Patient's blood pressures are soft on arrival.  She complains of general weakness will rehydrate with a liter of lactated Ringer's and give Zofran for nausea.  Lab results reviewed by myself.  Lab work unremarkable.  CT scan interpretation by radiology negative for acute findings.  Patient is reassessed after hydration and Zofran.  She feels much improved.  Blood pressures are normotensive now.  With surgical intra-abdominal etiology ruled out, patient is stable for discharge.  She is symptomatically improved with hydration and nausea control.  This appears most  likely viral syndrome with diarrhea.  Patient is tolerating oral intake and will be prescribed Zofran for home use.  Follow-up plan within the next 3 to 5 days reviewed and return precautions reviewed.  Patient stable for discharge.        Final Clinical Impression(s) / ED Diagnoses Final diagnoses:  General weakness  Diarrhea, unspecified type  Mild dehydration  Nausea    Rx / DC Orders ED Discharge Orders          Ordered    ondansetron (ZOFRAN-ODT) 4 MG disintegrating tablet  Every 4 hours PRN        07/17/21 2102              Charlesetta Shanks, MD 07/17/21 2107

## 2021-07-17 NOTE — Discharge Instructions (Signed)
1.  At this time appears most likely you have a viral illness.  You have become mildly dehydrated. ?2.  Take extra strength Tylenol every 6 hours for fever or body aches.  Try to stay hydrated drinking fluids frequently.  Zofran for nausea.  Try to rest. ?3.  You should be improving over the next couple of days.  Schedule an appointment with your doctor for recheck within the next 3 to 5 days. ?4.  Return to emergency department immediately if you are getting worsening symptoms, new concerning symptoms or new unusual pain. ?

## 2021-07-19 DIAGNOSIS — B353 Tinea pedis: Secondary | ICD-10-CM | POA: Diagnosis not present

## 2021-07-19 DIAGNOSIS — Z79899 Other long term (current) drug therapy: Secondary | ICD-10-CM | POA: Diagnosis not present

## 2021-07-19 DIAGNOSIS — B351 Tinea unguium: Secondary | ICD-10-CM | POA: Diagnosis not present

## 2021-08-02 DIAGNOSIS — B353 Tinea pedis: Secondary | ICD-10-CM | POA: Diagnosis not present

## 2021-08-02 DIAGNOSIS — L603 Nail dystrophy: Secondary | ICD-10-CM | POA: Diagnosis not present

## 2021-08-02 DIAGNOSIS — B351 Tinea unguium: Secondary | ICD-10-CM | POA: Diagnosis not present

## 2021-08-02 DIAGNOSIS — Z79899 Other long term (current) drug therapy: Secondary | ICD-10-CM | POA: Diagnosis not present

## 2021-08-08 DIAGNOSIS — Z1231 Encounter for screening mammogram for malignant neoplasm of breast: Secondary | ICD-10-CM | POA: Diagnosis not present

## 2021-08-08 LAB — HM MAMMOGRAPHY

## 2021-08-09 ENCOUNTER — Encounter: Payer: Self-pay | Admitting: Family Medicine

## 2021-08-09 ENCOUNTER — Telehealth (INDEPENDENT_AMBULATORY_CARE_PROVIDER_SITE_OTHER): Payer: Medicare HMO | Admitting: Family Medicine

## 2021-08-09 VITALS — Temp 99.7°F

## 2021-08-09 DIAGNOSIS — R059 Cough, unspecified: Secondary | ICD-10-CM | POA: Diagnosis not present

## 2021-08-09 DIAGNOSIS — R0981 Nasal congestion: Secondary | ICD-10-CM | POA: Diagnosis not present

## 2021-08-09 MED ORDER — BENZONATATE 100 MG PO CAPS: ORAL_CAPSULE | ORAL | 0 refills | Status: DC

## 2021-08-09 MED ORDER — DOXYCYCLINE HYCLATE 100 MG PO TABS: 100.0000 mg | ORAL_TABLET | Freq: Two times a day (BID) | ORAL | 0 refills | Status: DC

## 2021-08-09 NOTE — Patient Instructions (Signed)
?  HOME CARE TIPS: ? ?-COVID19 testing information: ?ForwardDrop.tn ? ?Most pharmacies also offer testing and home test kits. If the Covid19 test is positive and you desire antiviral treatment, please contact a [facility] or schedule a follow up virtual visit through your primary care office or through the [facility].  ?Other test to treat options: ?ConnectRV.is?click_source=alert ? ?-I sent the medication(s) we discussed to your pharmacy: ?Meds ordered this encounter  ?Medications  ? benzonatate (TESSALON PERLES) 100 MG capsule  ?  Sig: 1-2 capsules up to twice daily as needed for cough  ?  Dispense:  20 capsule  ?  Refill:  0  ? doxycycline (VIBRA-TABS) 100 MG tablet  ?  Sig: Take 1 tablet (100 mg total) by mouth 2 (two) times daily.  ?  Dispense:  14 tablet  ?  Refill:  0  ? ? ?-can use tylenol if needed for fevers, aches and pains per instructions ? ?-nasal saline sinus rinses twice daily ? ?-stay hydrated, drink plenty of fluids and eat small healthy meals - avoid dairy ? ?-can take 1000 IU (86mg) Vit D3 and 100-500 mg of Vit C daily per instructions ? ?-follow up with your doctor in 2-3 days unless improving and feeling better ? ?-stay home while sick, except to seek medical care. If you have COVID19, you will likely be contagious for 7-10 days. Flu or Influenza is likely contagious for about 7 days. Other respiratory viral infections remain contagious for 5-10+ days depending on the virus and many other factors. Wear a good mask that fits snugly (such as N95 or KN95) if around others to reduce the risk of transmission. ? ?It was nice to meet you today, and I really hope you are feeling better soon. I help [facility] out with telemedicine visits on Tuesdays and Thursdays and am happy to help if you need a follow up virtual visit on those days. Otherwise, if you have any concerns or questions following this visit please schedule a follow up  visit with your Primary Care doctor or seek care at a local urgent care clinic to avoid delays in care.  ? ? ?Seek in person care or schedule a follow up video visit promptly if your symptoms worsen, new concerns arise or you are not improving with treatment. Call 911 and/or seek emergency care if your symptoms are severe or life threatening. ? ? ?

## 2021-08-09 NOTE — Progress Notes (Signed)
Virtual Visit via Telephone Note ? ?I connected with Jacqueline Myers on 08/09/21 at  4:20 PM EDT by telephone and verified that I am speaking with the correct person using two identifiers. ?  ?I discussed the limitations of performing an evaluation and management service by telephone and requested permission for a phone visit. The patient expressed understanding and agreed to proceed. ? ?Location patient:  [place] ?Location provider: work or home office ?Participants present for the call: patient, provider ?Patient did not have a visit with me in the prior 7 days to address this/these issue(s). ? ? ?History of Present Illness: ? ?Acute telemedicine visit for cough and congestion: ?-Onset: about 3 days ago; she did have some stuffy nose the last few weeks and has been using netty pot, but worsened the last 3 days  ?-Symptoms include: nasal congestion, cough, sore throat, ribs hurt from cough, coughing up green mucus, low grade temp around 99 now - has been 99-101, body aches, chills ?-her grandkids have conjunctivitis currently ?-Denies: CP (other than coughing), SOB, NVD ?-she did a covid test yesterday that was negative ?-Has tried: tylenol, vics ?-Pertinent past medical history: see below, has had covid once in the past ?-Pertinent medication allergies:  ?Allergies  ?Allergen Reactions  ? Morphine And Related Shortness Of Breath  ?  Per patient does not have an allergy to this   ? Other Other (See Comments)  ?  Joint pain. Patient denies being allergic to anything else.simvastatin   ? Statins Other (See Comments)  ?  Joint pain. Patient denies being allergic to anything else.simvastatin   ? Lisinopril Cough  ?  Tolerates losartan  ? Oxycodone Nausea Only and Palpitations  ?  Change in mental status, camps, anxiety, chills  ? Simvastatin Other (See Comments)  ?  Myalgias  ?-COVID-19 vaccine status: ?Immunization History  ?Administered Date(s) Administered  ? Fluad Quad(high Dose 65+) 01/15/2019, 02/21/2021  ?  Influenza Split 04/20/2011, 03/08/2012, 03/12/2013  ? Influenza Whole 04/28/2009, 05/04/2010  ? Influenza, High Dose Seasonal PF 02/08/2015, 01/13/2016, 01/30/2017, 01/24/2018, 01/29/2020  ? Influenza,inj,Quad PF,6+ Mos 12/29/2013  ? Influenza,inj,quad, With Preservative 01/06/2017  ? PFIZER(Purple Top)SARS-COV-2 Vaccination 06/16/2019, 07/11/2019, 08/23/2020  ? Pneumococcal Conjugate-13 06/28/2015  ? Pneumococcal Polysaccharide-23 06/20/2016  ? Pneumococcal-Unspecified 05/09/2016  ? Td 05/08/2004  ? Tdap 07/03/2014  ? Zoster Recombinat (Shingrix) 02/21/2021, 05/22/2021  ? Zoster, Live 06/28/2011  ? ? ?  ?Past Medical History:  ?Diagnosis Date  ? Colon polyps   ? hyperplastic  ? Diverticulosis   ? Esophageal stricture   ? GERD (gastroesophageal reflux disease)   ? History of bronchitis   ? Hyperlipidemia   ? Hypertension   ? Mitral valve prolapse   ? Osteoarthritis   ? Osteoporosis   ? Spinal stenosis   ? ? ?Current Outpatient Medications on File Prior to Visit  ?Medication Sig Dispense Refill  ? acetaminophen (TYLENOL) 500 MG tablet Take 500-1,000 mg by mouth every 6 (six) hours as needed for moderate pain.    ? Candesartan Cilexetil-HCTZ 32-25 MG TABS TAKE 1 TABLET BY MOUTH ONCE DAILY . 90 tablet 3  ? cyclobenzaprine (FLEXERIL) 10 MG tablet TAKE 1/2 TO 1 (ONE-HALF TO ONE) TABLET BY MOUTH THREE TIMES DAILY AS NEEDED FOR MUSCLE SPASM 30 tablet 0  ? diclofenac sodium (VOLTAREN) 1 % GEL Apply 4 g topically 4 (four) times daily. 100 g 2  ? levothyroxine (SYNTHROID) 50 MCG tablet TAKE 1/2 (ONE-HALF) TABLET BY MOUTH ONCE DAILY . 45 tablet 3  ?  Omega-3 Fatty Acids (FISH OIL) 1200 MG CPDR Take 2 capsules by mouth daily.    ? omeprazole (PRILOSEC) 20 MG capsule TAKE 1 CAPSULE BY MOUTH ONCE DAILY 90 capsule 3  ? ondansetron (ZOFRAN-ODT) 4 MG disintegrating tablet Take 1 tablet (4 mg total) by mouth every 4 (four) hours as needed for nausea or vomiting. 20 tablet 0  ? rosuvastatin (CRESTOR) 5 MG tablet TAKE 1 TABLET BY MOUTH  ONCE DAILY . 90 tablet 3  ? valACYclovir (VALTREX) 1000 MG tablet TAKE 1 TABLET BY MOUTH THREE TIMES DAILY AS DIRECTED 30 tablet 0  ? ?No current facility-administered medications on file prior to visit.  ? ? ?Observations/Objective: ?Patient sounds cheerful and well on the phone. ?I do not appreciate any SOB. ?Speech and thought processing are grossly intact. ?Patient reported vitals: T 99.6 ? ?Assessment and Plan: ? ?Nasal congestion ? ?Cough, unspecified type ? ?-we discussed possible serious and likely etiologies, options for evaluation and workup, limitations of telemedicine visit vs in person visit, treatment, treatment risks and precautions. Pt prefers to treat via telemedicine empirically rather than in person at this moment. Query 2ndary bacterial sinusitis, VURI, [phone] with false neg testing vs other. She has opted to covid test again tomorrow (if positive knows can contact [facility] or do f/u visit for antiviral), nasal saline, Tessalon Rx for cough and initiate doxy '100mg'$  bid x 7 days if worsening or not improving.  ?Work/School slipped offered: declined ?Advised to seek prompt virtual visit or in person care if worsening, new symptoms arise, or if is not improving with treatment as expected per our conversation of expected course. Discussed options for follow up care. Did let this patient know that I do telemedicine on Tuesdays and Thursdays for [place] and those are the days I am logged into the system. Advised to schedule follow up visit with PCP, [facility] virtual visits or [facility] if any further questions or concerns to avoid delays in care. ?  ?I discussed the assessment and treatment plan with the patient. The patient was provided an opportunity to ask questions and all were answered. The patient agreed with the plan and demonstrated an understanding of the instructions. ?  ? ?Follow Up Instructions: ? ?I did not refer this patient for an OV with me in the next 24 hours for this/these  issue(s). ? ?I discussed the assessment and treatment plan with the patient. The patient was provided an opportunity to ask questions and all were answered. The patient agreed with the plan and demonstrated an understanding of the instructions. ? ? ?I spent 18 minutes on the date of this visit in the care of this patient. See summary of tasks completed to properly care for this patient in the detailed notes above which also included counseling of above, review of PMH, medications, allergies, evaluation of the patient and ordering and/or  instructing patient on testing and care options.   ? ? ?[name], DO  ? ?

## 2021-08-18 ENCOUNTER — Encounter: Payer: Self-pay | Admitting: Internal Medicine

## 2021-08-18 DIAGNOSIS — Z79899 Other long term (current) drug therapy: Secondary | ICD-10-CM | POA: Diagnosis not present

## 2021-08-18 DIAGNOSIS — B351 Tinea unguium: Secondary | ICD-10-CM | POA: Diagnosis not present

## 2021-08-18 DIAGNOSIS — L603 Nail dystrophy: Secondary | ICD-10-CM | POA: Diagnosis not present

## 2021-08-18 DIAGNOSIS — B353 Tinea pedis: Secondary | ICD-10-CM | POA: Diagnosis not present

## 2021-08-21 ENCOUNTER — Encounter: Payer: Self-pay | Admitting: Internal Medicine

## 2021-08-21 DIAGNOSIS — R053 Chronic cough: Secondary | ICD-10-CM

## 2021-08-22 NOTE — Telephone Encounter (Signed)
So   the   chest imaging  in the record  shows some mild emphysema but that still doesn't  give [place] the cause of your cough. ? ?Some times a variant of asthma  or CLD can cause cough as well as side effect of medication  and even  GERD. (Last pfts were 7 years ago .) ? ?If you agree , would advise consider  pulmonary consult /referral  ? ?Can make a virtual visit to discuss   consider other  ideas    or we can do the referral alone.  ? ? ? ?

## 2021-08-22 NOTE — Telephone Encounter (Signed)
I placed a pulmonary referral  ?You should be contacted about  appt . ?Not sure how long it will take. If getting worse let us know In interim  ?

## 2021-08-30 ENCOUNTER — Ambulatory Visit: Payer: Medicare HMO | Admitting: Pulmonary Disease

## 2021-08-30 ENCOUNTER — Encounter: Payer: Self-pay | Admitting: Pulmonary Disease

## 2021-08-30 VITALS — BP 116/64 | HR 103 | Temp 98.5°F | Ht 63.0 in | Wt 143.0 lb

## 2021-08-30 DIAGNOSIS — R053 Chronic cough: Secondary | ICD-10-CM

## 2021-08-30 LAB — CBC WITH DIFFERENTIAL/PLATELET
Basophils Absolute: 0 10*3/uL (ref 0.0–0.1)
Basophils Relative: 0.5 % (ref 0.0–3.0)
Eosinophils Absolute: 0.4 10*3/uL (ref 0.0–0.7)
Eosinophils Relative: 4.6 % (ref 0.0–5.0)
HCT: 39.5 % (ref 36.0–46.0)
Hemoglobin: 13.1 g/dL (ref 12.0–15.0)
Lymphocytes Relative: 32.2 % (ref 12.0–46.0)
Lymphs Abs: 2.5 10*3/uL (ref 0.7–4.0)
MCHC: 33.2 g/dL (ref 30.0–36.0)
MCV: 87.7 fl (ref 78.0–100.0)
Monocytes Absolute: 1 10*3/uL (ref 0.1–1.0)
Monocytes Relative: 13.1 % — ABNORMAL HIGH (ref 3.0–12.0)
Neutro Abs: 3.9 10*3/uL (ref 1.4–7.7)
Neutrophils Relative %: 49.6 % (ref 43.0–77.0)
Platelets: 339 10*3/uL (ref 150.0–400.0)
RBC: 4.51 Mil/uL (ref 3.87–5.11)
RDW: 14.2 % (ref 11.5–15.5)
WBC: 7.8 10*3/uL (ref 4.0–10.5)

## 2021-08-30 MED ORDER — BUDESONIDE-FORMOTEROL FUMARATE 160-4.5 MCG/ACT IN AERO: 2.0000 | INHALATION_SPRAY | Freq: Two times a day (BID) | RESPIRATORY_TRACT | 5 refills | Status: DC

## 2021-08-30 NOTE — Progress Notes (Signed)
? ?      ?Jacqueline Myers    [id number]    1950-10-28 ? ?Primary Care Physician:Panosh, Standley Brooking, MD ? ?Referring Physician: Burnis Medin, MD ?[address] ?[place],  [place] [address] ? ?Chief complaint: Consult for chronic cough ? ?HPI: ?71 year old with history of chronic cough, GERD, esophageal stricture, hypertension, hyperlipidemia ? ?She has had cough since 2016 with occasional mucus which is green/white.  Denies any dyspnea, wheezing.  Has been tried on PPI since 2016 for GERD, esophageal stricture but cough persists.  For the past few weeks she has had sinus congestion and postnasal drip which is exacerbating the issue. ?Previously had ACE inhibitor induced cough due to lisinopril.  ACE inhibitor was discontinued in 2000 and she has been on an ARB since then. ? ?Chest imaging showed mild emphysema due to smoking.  She is currently not on any inhalers ? ?Pets: No pets ?Occupation: Retired Futures trader ?Exposures: No mold, hot tub, Jacuzzi.  No feather pillows or comforters ?Smoking history: 30-pack-year smoker.  Quit in 2000 ?Travel history: No significant travel history ?Relevant family history: No family history of lung disease ? ?Outpatient Encounter Medications as of 08/30/2021  ?Medication Sig  ? acetaminophen (TYLENOL) 500 MG tablet Take 500-1,000 mg by mouth every 6 (six) hours as needed for moderate pain.  ? Candesartan Cilexetil-HCTZ 32-25 MG TABS TAKE 1 TABLET BY MOUTH ONCE DAILY .  ? cyclobenzaprine (FLEXERIL) 10 MG tablet TAKE 1/2 TO 1 (ONE-HALF TO ONE) TABLET BY MOUTH THREE TIMES DAILY AS NEEDED FOR MUSCLE SPASM  ? diclofenac sodium (VOLTAREN) 1 % GEL Apply 4 g topically 4 (four) times daily.  ? doxycycline (VIBRA-TABS) 100 MG tablet Take 1 tablet (100 mg total) by mouth 2 (two) times daily.  ? levothyroxine (SYNTHROID) 50 MCG tablet TAKE 1/2 (ONE-HALF) TABLET BY MOUTH ONCE DAILY .  ? Omega-3 Fatty Acids (FISH OIL) 1200 MG CPDR Take 2 capsules by mouth daily.  ? omeprazole  (PRILOSEC) 20 MG capsule TAKE 1 CAPSULE BY MOUTH ONCE DAILY  ? ondansetron (ZOFRAN-ODT) 4 MG disintegrating tablet Take 1 tablet (4 mg total) by mouth every 4 (four) hours as needed for nausea or vomiting.  ? rosuvastatin (CRESTOR) 5 MG tablet TAKE 1 TABLET BY MOUTH ONCE DAILY .  ? valACYclovir (VALTREX) 1000 MG tablet TAKE 1 TABLET BY MOUTH THREE TIMES DAILY AS DIRECTED  ? benzonatate (TESSALON PERLES) 100 MG capsule 1-2 capsules up to twice daily as needed for cough  ? ?No facility-administered encounter medications on file as of 08/30/2021.  ? ? ?Allergies as of 08/30/2021 - Review Complete 08/30/2021  ?Allergen Reaction Noted  ? Morphine and related Shortness Of Breath 06/21/2015  ? Other Other (See Comments) 08/19/2015  ? Statins Other (See Comments) 08/19/2015  ? Lisinopril Cough 09/02/2014  ? Oxycodone Nausea Only and Palpitations 04/24/2015  ? Simvastatin Other (See Comments) 05/25/2008  ? ? ?Past Medical History:  ?Diagnosis Date  ? Colon polyps   ? hyperplastic  ? Diverticulosis   ? Esophageal stricture   ? GERD (gastroesophageal reflux disease)   ? History of bronchitis   ? Hyperlipidemia   ? Hypertension   ? Mitral valve prolapse   ? Osteoarthritis   ? Osteoporosis   ? Spinal stenosis   ? ? ?Past Surgical History:  ?Procedure Laterality Date  ? COLONOSCOPY    ? NM MYOVIEW LTD  08/2006  ? normal for abn ekg  ? TONSILLECTOMY    ? TOTAL KNEE ARTHROPLASTY Left  04/14/2015  ? Procedure: LEFT TOTAL KNEE ARTHROPLASTY;  Surgeon: Gaynelle Arabian, MD;  Location: [facility];  Service: Orthopedics;  Laterality: Left;  ? ? ?Family History  ?Problem Relation Age of Onset  ? Breast cancer Sister   ? Alcohol abuse Mother   ? Stroke Mother   ? Hypertension Mother   ? Diabetes Mother   ? Hyperlipidemia Mother   ? Hyperlipidemia Father   ? Colon cancer Neg Hx   ? ? ?Social History  ? ?Socioeconomic History  ? Marital status: Married  ?  Spouse name: Not on file  ? Number of children: 2  ? Years of education: Not on file  ? Highest  education level: Not on file  ?Occupational History  ? Occupation: Retired  ?Tobacco Use  ? Smoking status: Former  ?  Packs/day: 1.00  ?  Years: 29.00  ?  Pack years: 29.00  ?  Types: Cigarettes  ?  Quit date: 05/09/1999  ?  Years since quitting: 22.3  ? Smokeless tobacco: Never  ?Vaping Use  ? Vaping Use: Never used  ?Substance and Sexual Activity  ? Alcohol use: Yes  ?  Alcohol/week: 0.0 standard drinks  ?  Comment: OCC- [facility]  ? Drug use: No  ? Sexual activity: Not Currently  ?Other Topics Concern  ? Not on file  ?Social History Narrative  ? Married  ? 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  ? 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 Regular exercise-  limited recently by knee predicament and pain gardens.  ? Neg td neg ets  ? ?Social Determinants of Health  ? ?Financial Resource Strain: Low Risk   ? Difficulty of Paying Living Expenses: Not hard at all  ?Food Insecurity: No Food Insecurity  ? Worried About Charity fundraiser in the Last Year: Never true  ? Ran Out of Food in the Last Year: Never true  ?Transportation Needs: No Transportation Needs  ? Lack of Transportation (Medical): No  ? Lack of Transportation (Non-Medical): No  ?Physical Activity: Inactive  ? Days of Exercise per Week: 0 days  ? Minutes of Exercise per Session: 0 min  ?Stress: No Stress Concern Present  ? Feeling of Stress : Not at all  ?Social Connections: Moderately Integrated  ? Frequency of Communication with Friends and Family: Three times a week  ? Frequency of Social Gatherings with Friends and Family: Three times a week  ? Attends Religious Services: More than 4 times per year  ? Active Member of Clubs or Organizations: No  ? Attends Archivist Meetings: Never  ? Marital Status: Married  ?Intimate Partner Violence: Not At Risk  ? Fear of Current or Ex-Partner: No  ? Emotionally Abused: No  ? Physically Abused: No  ? Sexually Abused: No  ? ? ?Review of systems: ?Review of Systems   ?Constitutional: Negative for fever and chills.  ?HENT: Negative.   ?Eyes: Negative for blurred vision.  ?Respiratory: as per HPI  ?Cardiovascular: Negative for chest pain and palpitations.  ?Gastrointestinal: Negative for vomiting, diarrhea, blood per rectum. ?Genitourinary: Negative for dysuria, urgency, frequency and hematuria.  ?Musculoskeletal: Negative for myalgias, back pain and joint pain.  ?Skin: Negative for itching and rash.  ?Neurological: Negative for dizziness, tremors, focal weakness, seizures and loss of consciousness.  ?Endo/Heme/Allergies: Negative for environmental allergies.  ?Psychiatric/Behavioral: Negative for depression, suicidal ideas and hallucinations.  ?All other systems reviewed and are  negative. ? ?Physical Exam: ?Blood pressure 116/64, pulse (!) 103, temperature 98.5 ?F (36.9 ?C), temperature source Oral, height '5\' 3"'$  (1.6 m), weight 143 lb (64.9 kg), SpO2 98 %. ?Gen:      No acute distress ?HEENT:  EOMI, sclera anicteric ?Neck:     No masses; no thyromegaly ?Lungs:    Clear to auscultation bilaterally; normal respiratory effort ?CV:         Regular rate and rhythm; no murmurs ?Abd:      + bowel sounds; soft, non-tender; no palpable masses, no distension ?Ext:    No edema; adequate peripheral perfusion ?Skin:      Warm and dry; no rash ?Neuro: alert and oriented x 3 ?Psych: normal mood and affect ? ?Data Reviewed: ?Imaging: ?CT chest 04/20/2021-mild emphysema, no interstitial lung disease, coronary artery calcification ?CT abdomen pelvis 07/17/2021-visualized lung bases are normal ?I have reviewed the images personally. ? ?PFTs: ?07/08/2014 ?FVC 3.12 [98%], FEV1 2.47 [101%], F/F 79, TLC 5.14 [101%], DLCO 25.68 [105%] ?Normal test ? ?Labs: ?CBC 03/24/2021-WBC 5.9, eos 5.5%, absolute eosinophil count 325 ? ?Assessment:  ?Chronic cough ?Suspect combination of upper airway cough, GERD.  She may have small airway disease as well with mild emphysema and possibly asthma given elevated  peripheral eosinophils in the past and sinus congestion with postnasal drip ? ? ?Plan/Recommendations: ?Treat postnasal drip with Flonase nasal spray and chlorpheniramine ?Start Symbicort 160 ?Check CBC, IgE, alpha-1 anti

## 2021-08-30 NOTE — Patient Instructions (Signed)
We will start Flonase nasal spray and chlorpheniramine 4 mg 3 times a day.  These medications are also available over-the-counter ?Start Symbicort 160/4.5 ?Check CBC with differential, IgE, alpha-1 antitrypsin levels and phenotype ?Schedule PFTs at next available and follow-up after PFTs. ?

## 2021-09-06 DIAGNOSIS — M5134 Other intervertebral disc degeneration, thoracic region: Secondary | ICD-10-CM | POA: Diagnosis not present

## 2021-09-06 DIAGNOSIS — S138XXA Sprain of joints and ligaments of other parts of neck, initial encounter: Secondary | ICD-10-CM | POA: Diagnosis not present

## 2021-09-06 DIAGNOSIS — M9902 Segmental and somatic dysfunction of thoracic region: Secondary | ICD-10-CM | POA: Diagnosis not present

## 2021-09-06 DIAGNOSIS — M9901 Segmental and somatic dysfunction of cervical region: Secondary | ICD-10-CM | POA: Diagnosis not present

## 2021-09-07 LAB — IGE: IgE (Immunoglobulin E), Serum: 8 kU/L (ref <=114)

## 2021-09-07 LAB — ALPHA-1 ANTITRYPSIN PHENOTYPE: A-1 Antitrypsin, Ser: 187 mg/dL (ref 83–199)

## 2021-09-12 DIAGNOSIS — M5134 Other intervertebral disc degeneration, thoracic region: Secondary | ICD-10-CM | POA: Diagnosis not present

## 2021-09-12 DIAGNOSIS — S138XXA Sprain of joints and ligaments of other parts of neck, initial encounter: Secondary | ICD-10-CM | POA: Diagnosis not present

## 2021-09-12 DIAGNOSIS — M9901 Segmental and somatic dysfunction of cervical region: Secondary | ICD-10-CM | POA: Diagnosis not present

## 2021-09-12 DIAGNOSIS — M9902 Segmental and somatic dysfunction of thoracic region: Secondary | ICD-10-CM | POA: Diagnosis not present

## 2021-09-13 DIAGNOSIS — B351 Tinea unguium: Secondary | ICD-10-CM | POA: Diagnosis not present

## 2021-09-13 DIAGNOSIS — L603 Nail dystrophy: Secondary | ICD-10-CM | POA: Diagnosis not present

## 2021-09-13 DIAGNOSIS — Z79899 Other long term (current) drug therapy: Secondary | ICD-10-CM | POA: Diagnosis not present

## 2021-09-13 DIAGNOSIS — B353 Tinea pedis: Secondary | ICD-10-CM | POA: Diagnosis not present

## 2021-09-19 DIAGNOSIS — M5134 Other intervertebral disc degeneration, thoracic region: Secondary | ICD-10-CM | POA: Diagnosis not present

## 2021-09-19 DIAGNOSIS — S138XXA Sprain of joints and ligaments of other parts of neck, initial encounter: Secondary | ICD-10-CM | POA: Diagnosis not present

## 2021-09-19 DIAGNOSIS — M9902 Segmental and somatic dysfunction of thoracic region: Secondary | ICD-10-CM | POA: Diagnosis not present

## 2021-09-19 DIAGNOSIS — M9901 Segmental and somatic dysfunction of cervical region: Secondary | ICD-10-CM | POA: Diagnosis not present

## 2021-09-20 ENCOUNTER — Other Ambulatory Visit: Payer: Self-pay | Admitting: Internal Medicine

## 2021-09-21 ENCOUNTER — Ambulatory Visit (INDEPENDENT_AMBULATORY_CARE_PROVIDER_SITE_OTHER): Payer: Medicare HMO | Admitting: Internal Medicine

## 2021-09-21 ENCOUNTER — Encounter: Payer: Self-pay | Admitting: Internal Medicine

## 2021-09-21 VITALS — BP 122/74 | HR 75 | Temp 98.6°F | Ht 63.0 in | Wt 141.2 lb

## 2021-09-21 DIAGNOSIS — E785 Hyperlipidemia, unspecified: Secondary | ICD-10-CM | POA: Diagnosis not present

## 2021-09-21 DIAGNOSIS — R739 Hyperglycemia, unspecified: Secondary | ICD-10-CM | POA: Diagnosis not present

## 2021-09-21 DIAGNOSIS — E039 Hypothyroidism, unspecified: Secondary | ICD-10-CM

## 2021-09-21 DIAGNOSIS — I10 Essential (primary) hypertension: Secondary | ICD-10-CM

## 2021-09-21 DIAGNOSIS — E876 Hypokalemia: Secondary | ICD-10-CM | POA: Diagnosis not present

## 2021-09-21 DIAGNOSIS — Z79899 Other long term (current) drug therapy: Secondary | ICD-10-CM

## 2021-09-21 LAB — TSH: TSH: 2.08 u[IU]/mL (ref 0.35–5.50)

## 2021-09-21 LAB — LIPID PANEL
Cholesterol: 161 mg/dL (ref 0–200)
HDL: 57.5 mg/dL (ref >39.00)
LDL Cholesterol: 78 mg/dL (ref 0–99)
NonHDL: 103.19
Total CHOL/HDL Ratio: 3
Triglycerides: 127 mg/dL (ref 0.0–149.0)
VLDL: 25.4 mg/dL (ref 0.0–40.0)

## 2021-09-21 LAB — HEMOGLOBIN A1C: Hgb A1c MFr Bld: 6.1 % (ref 4.6–6.5)

## 2021-09-21 LAB — BASIC METABOLIC PANEL
BUN: 26 mg/dL — ABNORMAL HIGH (ref 6–23)
CO2: 28 mEq/L (ref 19–32)
Calcium: 10 mg/dL (ref 8.4–10.5)
Chloride: 100 mEq/L (ref 96–112)
Creatinine, Ser: 0.88 mg/dL (ref 0.40–1.20)
GFR: 65.93 mL/min (ref >60.00)
Glucose, Bld: 92 mg/dL (ref 70–99)
Potassium: 4.3 mEq/L (ref 3.5–5.1)
Sodium: 137 mEq/L (ref 135–145)

## 2021-09-21 LAB — VITAMIN D 25 HYDROXY (VIT D DEFICIENCY, FRACTURES): VITD: 44.61 ng/mL (ref 30.00–100.00)

## 2021-09-21 LAB — MAGNESIUM: Magnesium: 2.1 mg/dL (ref 1.5–2.5)

## 2021-09-21 NOTE — Progress Notes (Signed)
? ?Chief Complaint  ?Patient presents with  ? Follow-up  ? ? ?HPI: ?Jacqueline Myers 71 y.o. come in for Chronic disease management  last in person visit 11 22  ? ?Resp:  saw Dr  Vaughan Browner for chronic cough  and abd ct findings   began allergy meds and Symbicort  ?Was to have pfts and fu did not get the Symbicort filled because it was too expensive and insurance did not cover it was wondering if she really needs to follow-up with lung functions etc. ? ?BP candasartan hctz no concerns about blood pressure ?Spinal pain under care ?HLD  rosuvastatin no concerns ?Thyroid  25 mcg per day ?Omeprazole    has been on on for years .   Tried t to wean off but gets rebound  , bad the next day.  For advice as realizes it may not be good to be on it for a long time.  She was put on the medication by a GI doctor a long time ago maybe she thinks it might have been related to cough but not sure ?Since her mom died ishe has weaned off citalopram been off for a number of months. 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 ?Chronic cough ? ?Suspect combination of upper airway cough, GERD.  She may have small airway disease as well with mild emphysema and possibly asthma given elevated peripheral eosinophils in the past and sinus congestion with postnasal drip ?ROS: See pertinent positives and negatives per HPI. ? ?Past Medical History:  ?Diagnosis Date  ? Colon polyps   ? hyperplastic  ? Diverticulosis   ? Esophageal stricture   ? GERD (gastroesophageal reflux disease)   ? History of bronchitis   ? Hyperlipidemia   ? Hypertension   ? Mitral valve prolapse   ? Osteoarthritis   ? Osteoporosis   ? Spinal stenosis   ? ? ?Family History  ?Problem Relation Age of Onset  ? Breast cancer Sister   ? Alcohol abuse Mother   ? Stroke Mother   ? Hypertension Mother   ? Diabetes Mother   ? Hyperlipidemia Mother   ? Hyperlipidemia Father   ? Colon cancer Neg Hx   ? ? ?Social History  ? ?Socioeconomic History  ? Marital status:  Married  ?  Spouse name: Not on file  ? Number of children: 2  ? Years of education: Not on file  ? Highest education level: Master's degree (e.g., MA, MS, MEng, MEd, MSW, MBA)  ?Occupational History  ? Occupation: Retired  ?Tobacco Use  ? Smoking status: Former  ?  Packs/day: 1.00  ?  Years: 29.00  ?  Pack years: 29.00  ?  Types: Cigarettes  ?  Quit date: 05/09/1999  ?  Years since quitting: 22.3  ? Smokeless tobacco: Never  ?Vaping Use  ? Vaping Use: Never used  ?Substance and Sexual Activity  ? Alcohol use: Yes  ?  Alcohol/week: 0.0 standard drinks  ?  Comment: OCC- [facility]  ? Drug use: No  ? Sexual activity: Not Currently  ?Other Topics Concern  ? Not on file  ?Social History Narrative  ? Married  ? 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  ? 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 Regular exercise-  limited recently by knee predicament and pain gardens.  ? Neg td neg ets  ? ?Social Determinants of Health  ? ?Financial Resource Strain:  Low Risk   ? Difficulty of Paying Living Expenses: Not very hard  ?Food Insecurity: No Food Insecurity  ? Worried About Charity fundraiser in the Last Year: Never true  ? Ran Out of Food in the Last Year: Never true  ?Transportation Needs: No Transportation Needs  ? Lack of Transportation (Medical): No  ? Lack of Transportation (Non-Medical): No  ?Physical Activity: Inactive  ? Days of Exercise per Week: 0 days  ? Minutes of Exercise per Session: 0 min  ?Stress: No Stress Concern Present  ? Feeling of Stress : Only a little  ?Social Connections: Socially Integrated  ? Frequency of Communication with Friends and Family: Twice a week  ? Frequency of Social Gatherings with Friends and Family: Twice a week  ? Attends Religious Services: More than 4 times per year  ? Active Member of Clubs or Organizations: Yes  ? Attends Archivist Meetings: More than 4 times per year  ? Marital Status: Married  ? ? ?Outpatient Medications Prior to Visit   ?Medication Sig Dispense Refill  ? acetaminophen (TYLENOL) 500 MG tablet Take 500-1,000 mg by mouth every 6 (six) hours as needed for moderate pain.    ? Candesartan Cilexetil-HCTZ 32-25 MG TABS TAKE 1 TABLET BY MOUTH ONCE DAILY . 90 tablet 3  ? cyclobenzaprine (FLEXERIL) 10 MG tablet TAKE 1/2 TO 1 TABLET BY MOUTH THREE TIMES DAILY AS NEEDED FOR MUSCLE SPASM 30 tablet 0  ? diclofenac sodium (VOLTAREN) 1 % GEL Apply 4 g topically 4 (four) times daily. 100 g 2  ? levothyroxine (SYNTHROID) 50 MCG tablet TAKE 1/2 (ONE-HALF) TABLET BY MOUTH ONCE DAILY . 45 tablet 3  ? Omega-3 Fatty Acids (FISH OIL) 1200 MG CPDR Take 2 capsules by mouth daily.    ? omeprazole (PRILOSEC) 20 MG capsule TAKE 1 CAPSULE BY MOUTH ONCE DAILY 90 capsule 3  ? ondansetron (ZOFRAN-ODT) 4 MG disintegrating tablet Take 1 tablet (4 mg total) by mouth every 4 (four) hours as needed for nausea or vomiting. 20 tablet 0  ? rosuvastatin (CRESTOR) 5 MG tablet TAKE 1 TABLET BY MOUTH ONCE DAILY . 90 tablet 3  ? valACYclovir (VALTREX) 1000 MG tablet TAKE 1 TABLET BY MOUTH THREE TIMES DAILY AS DIRECTED 30 tablet 0  ? budesonide-formoterol (SYMBICORT) 160-4.5 MCG/ACT inhaler Inhale 2 puffs into the lungs in the morning and at bedtime. 10.2 g 5  ? doxycycline (VIBRA-TABS) 100 MG tablet Take 1 tablet (100 mg total) by mouth 2 (two) times daily. 14 tablet 0  ? ?No facility-administered medications prior to visit.  ? ? ? ?EXAM: ? ?BP 122/74 (BP Location: Left Arm, Patient Position: Sitting, Cuff Size: Normal)   Pulse 75   Temp 98.6 ?F (37 ?C) (Oral)   Ht '5\' 3"'$  (1.6 m)   Wt 141 lb 3.2 oz (64 kg)   SpO2 97%   BMI 25.01 kg/m?  ? ?Body mass index is 25.01 kg/m?. ? ?GENERAL: vitals reviewed and listed above, alert, oriented, appears well hydrated and in no acute distress ?HEENT: atraumatic, conjunctiva  clear, no obvious abnormalities on inspection of external nose and ears NECK: no obvious masses on inspection palpation  ?LUNGS: clear to auscultation bilaterally,  no wheezes, rales or rhonchi, good air movement ?CV: HRRR, no clubbing cyanosis or  peripheral edema nl cap refill  ?Abdomen soft without again a megaly guarding or rebound ?MS: moves all extremities without noticeable focal  abnormality ?PSYCH: pleasant and cooperative, no obvious depression or anxiety ?Lab  Results  ?Component Value Date  ? WBC 7.8 08/30/2021  ? HGB 13.1 08/30/2021  ? HCT 39.5 08/30/2021  ? PLT 339.0 08/30/2021  ? GLUCOSE 110 (H) 07/17/2021  ? CHOL 192 03/24/2021  ? TRIG 187.0 (H) 03/24/2021  ? HDL 54.10 03/24/2021  ? LDLDIRECT 127.6 06/23/2013  ? LDLCALC 101 (H) 03/24/2021  ? ALT 18 07/17/2021  ? AST 22 07/17/2021  ? NA 134 (L) 07/17/2021  ? K 3.3 (L) 07/17/2021  ? CL 99 07/17/2021  ? CREATININE 0.77 07/17/2021  ? BUN 9 07/17/2021  ? CO2 23 07/17/2021  ? TSH 3.27 03/24/2021  ? INR 0.93 04/08/2015  ? HGBA1C 6.1 03/24/2021  ? ?BP Readings from Last 3 Encounters:  ?09/21/21 122/74  ?08/30/21 116/64  ?07/17/21 (!) 114/57  ?Labs reviewed from the ED potassium was 3.3 ? ?ASSESSMENT AND PLAN: ? ?Discussed the following assessment and plan: ? ?Essential hypertension - Plan: Lipid panel, Basic metabolic panel, TSH, Hemoglobin A1c, VITAMIN D 25 Hydroxy (Vit-D Deficiency, Fractures), Magnesium, Magnesium, VITAMIN D 25 Hydroxy (Vit-D Deficiency, Fractures), Hemoglobin A1c, TSH, Basic metabolic panel, Lipid panel ? ?Medication management - Plan: Lipid panel, Basic metabolic panel, TSH, Hemoglobin A1c, VITAMIN D 25 Hydroxy (Vit-D Deficiency, Fractures), Magnesium, Magnesium, VITAMIN D 25 Hydroxy (Vit-D Deficiency, Fractures), Hemoglobin A1c, TSH, Basic metabolic panel, Lipid panel ? ?Hyperlipidemia, unspecified hyperlipidemia type - Plan: Lipid panel, Basic metabolic panel, TSH, Hemoglobin A1c, VITAMIN D 25 Hydroxy (Vit-D Deficiency, Fractures), Magnesium, Magnesium, VITAMIN D 25 Hydroxy (Vit-D Deficiency, Fractures), Hemoglobin A1c, TSH, Basic metabolic panel, Lipid panel ? ?Hypothyroidism, unspecified type -  Plan: Lipid panel, Basic metabolic panel, TSH, Hemoglobin A1c, VITAMIN D 25 Hydroxy (Vit-D Deficiency, Fractures), Magnesium, Magnesium, VITAMIN D 25 Hydroxy (Vit-D Deficiency, Fractures), Hemoglobin A1c, TSH, Basi

## 2021-09-21 NOTE — Patient Instructions (Signed)
Good to see  you today . ?Advise complete fu pulmonary evaluation for reasons discussed .  ?Let them know  cost of Symbicort and ask if they want to try  something else.  ?For now stay on omeprazole  because of the cough  ?Checking vit d and calcium magnesium etc.  ? ?Fu cpx in fall  and go from there unless problems.  ? ? ? ?

## 2021-09-22 ENCOUNTER — Encounter: Payer: Self-pay | Admitting: Pulmonary Disease

## 2021-09-22 ENCOUNTER — Encounter: Payer: Self-pay | Admitting: Internal Medicine

## 2021-09-22 NOTE — Progress Notes (Signed)
Blood results are good ;potassium normal ;thyroid in range . Cholesterol in good range . No new action needed.Marland Kitchen

## 2021-09-23 ENCOUNTER — Other Ambulatory Visit (HOSPITAL_COMMUNITY): Payer: Self-pay

## 2021-09-23 ENCOUNTER — Encounter: Payer: Self-pay | Admitting: Pulmonary Disease

## 2021-09-23 NOTE — Telephone Encounter (Signed)
Test billing results return a copay of $47 for Symbicort.

## 2021-09-26 DIAGNOSIS — M9902 Segmental and somatic dysfunction of thoracic region: Secondary | ICD-10-CM | POA: Diagnosis not present

## 2021-09-26 DIAGNOSIS — M5134 Other intervertebral disc degeneration, thoracic region: Secondary | ICD-10-CM | POA: Diagnosis not present

## 2021-09-26 DIAGNOSIS — S138XXA Sprain of joints and ligaments of other parts of neck, initial encounter: Secondary | ICD-10-CM | POA: Diagnosis not present

## 2021-09-26 DIAGNOSIS — M9901 Segmental and somatic dysfunction of cervical region: Secondary | ICD-10-CM | POA: Diagnosis not present

## 2021-09-28 DIAGNOSIS — Z79899 Other long term (current) drug therapy: Secondary | ICD-10-CM | POA: Diagnosis not present

## 2021-09-28 DIAGNOSIS — B351 Tinea unguium: Secondary | ICD-10-CM | POA: Diagnosis not present

## 2021-09-28 DIAGNOSIS — B353 Tinea pedis: Secondary | ICD-10-CM | POA: Diagnosis not present

## 2021-09-28 DIAGNOSIS — L603 Nail dystrophy: Secondary | ICD-10-CM | POA: Diagnosis not present

## 2021-10-10 DIAGNOSIS — M9902 Segmental and somatic dysfunction of thoracic region: Secondary | ICD-10-CM | POA: Diagnosis not present

## 2021-10-10 DIAGNOSIS — M5134 Other intervertebral disc degeneration, thoracic region: Secondary | ICD-10-CM | POA: Diagnosis not present

## 2021-10-10 DIAGNOSIS — M9901 Segmental and somatic dysfunction of cervical region: Secondary | ICD-10-CM | POA: Diagnosis not present

## 2021-10-10 DIAGNOSIS — S138XXA Sprain of joints and ligaments of other parts of neck, initial encounter: Secondary | ICD-10-CM | POA: Diagnosis not present

## 2021-10-13 ENCOUNTER — Other Ambulatory Visit: Payer: Self-pay | Admitting: Internal Medicine

## 2021-10-20 DIAGNOSIS — B351 Tinea unguium: Secondary | ICD-10-CM | POA: Diagnosis not present

## 2021-10-24 DIAGNOSIS — M9901 Segmental and somatic dysfunction of cervical region: Secondary | ICD-10-CM | POA: Diagnosis not present

## 2021-10-24 DIAGNOSIS — S138XXA Sprain of joints and ligaments of other parts of neck, initial encounter: Secondary | ICD-10-CM | POA: Diagnosis not present

## 2021-10-24 DIAGNOSIS — M5134 Other intervertebral disc degeneration, thoracic region: Secondary | ICD-10-CM | POA: Diagnosis not present

## 2021-10-24 DIAGNOSIS — M9902 Segmental and somatic dysfunction of thoracic region: Secondary | ICD-10-CM | POA: Diagnosis not present

## 2021-10-26 ENCOUNTER — Ambulatory Visit (INDEPENDENT_AMBULATORY_CARE_PROVIDER_SITE_OTHER): Payer: Medicare HMO | Admitting: Pulmonary Disease

## 2021-10-26 ENCOUNTER — Encounter: Payer: Self-pay | Admitting: Pulmonary Disease

## 2021-10-26 ENCOUNTER — Ambulatory Visit: Payer: Medicare HMO | Admitting: Pulmonary Disease

## 2021-10-26 VITALS — BP 138/62 | HR 102 | Temp 98.3°F | Ht 63.0 in | Wt 143.8 lb

## 2021-10-26 DIAGNOSIS — R053 Chronic cough: Secondary | ICD-10-CM

## 2021-10-26 LAB — PULMONARY FUNCTION TEST
DL/VA % pred: 112 %
DL/VA: 4.66 ml/min/mmHg/L
DLCO cor % pred: 121 %
DLCO cor: 22.77 ml/min/mmHg
DLCO unc % pred: 120 %
DLCO unc: 22.56 ml/min/mmHg
FEF 25-75 Post: 2.14 L/sec
FEF 25-75 Pre: 1.83 L/sec
FEF2575-%Change-Post: 17 %
FEF2575-%Pred-Post: 119 %
FEF2575-%Pred-Pre: 101 %
FEV1-%Change-Post: 2 %
FEV1-%Pred-Post: 105 %
FEV1-%Pred-Pre: 102 %
FEV1-Post: 2.25 L
FEV1-Pre: 2.2 L
FEV1FVC-%Change-Post: 1 %
FEV1FVC-%Pred-Pre: 102 %
FEV6-%Change-Post: 1 %
FEV6-%Pred-Post: 105 %
FEV6-%Pred-Pre: 104 %
FEV6-Post: 2.86 L
FEV6-Pre: 2.83 L
FEV6FVC-%Pred-Post: 104 %
FEV6FVC-%Pred-Pre: 104 %
FVC-%Change-Post: 1 %
FVC-%Pred-Post: 100 %
FVC-%Pred-Pre: 99 %
FVC-Post: 2.86 L
FVC-Pre: 2.83 L
Post FEV1/FVC ratio: 79 %
Post FEV6/FVC ratio: 100 %
Pre FEV1/FVC ratio: 78 %
Pre FEV6/FVC Ratio: 100 %
RV % pred: 168 %
RV: 3.63 L
TLC % pred: 145 %
TLC: 7.11 L

## 2021-10-26 NOTE — Progress Notes (Signed)
Jacqueline Myers    [id number]    [birth year]  Primary Care Physician:Panosh, Standley Brooking, MD  Referring Physician: Burnis Medin, MD [place],  [place] [id number]  Chief complaint: Follow-up for chronic cough  HPI: 71 year old with history of chronic cough, GERD, esophageal stricture, hypertension, hyperlipidemia  She has had cough since 2016 with occasional mucus which is green/white.  Denies any dyspnea, wheezing.  Has been tried on PPI since 2016 for GERD, esophageal stricture but cough persists.  For the past few weeks she has had sinus congestion and postnasal drip which is exacerbating the issue. Previously had ACE inhibitor induced cough due to lisinopril.  ACE inhibitor was discontinued in 2000 and she has been on an ARB since then.  Chest imaging showed mild emphysema due to smoking.  She is currently not on any inhalers  Pets: No pets Occupation: Retired Futures trader Exposures: No mold, hot tub, Jacuzzi.  No feather pillows or comforters Smoking history: 30-pack-year smoker.  Quit in 2000 Travel history: No significant travel history Relevant family history: No family history of lung disease  Interim history: Cough is much improved with Flonase and chlorpheniramine antihistamine She was prescribed Symbicort at last visit but did not take as she had an allergy to inhaled corticosteroids from a reaction in the past  Overall she is doing much better now with no symptoms She is here for review of PFTs.  Outpatient Encounter Medications as of 10/26/2021  Medication Sig   acetaminophen (TYLENOL) 500 MG tablet Take 500-1,000 mg by mouth every 6 (six) hours as needed for moderate pain.   Candesartan Cilexetil-HCTZ 32-25 MG TABS TAKE 1 TABLET BY MOUTH ONCE DAILY .   cholecalciferol (VITAMIN D3) 25 MCG (1000 UNIT) tablet Take 2,000-4,000 Units by mouth.   cyclobenzaprine (FLEXERIL) 10 MG tablet TAKE 1/2 TO 1 (ONE-HALF TO ONE) TABLET BY MOUTH THREE  TIMES DAILY AS NEEDED FOR MUSCLE SPASM   diclofenac sodium (VOLTAREN) 1 % GEL Apply 4 g topically 4 (four) times daily.   levothyroxine (SYNTHROID) 50 MCG tablet TAKE 1/2 (ONE-HALF) TABLET BY MOUTH ONCE DAILY .   Omega-3 Fatty Acids (FISH OIL) 1200 MG CPDR Take 2 capsules by mouth daily.   omeprazole (PRILOSEC) 20 MG capsule TAKE 1 CAPSULE BY MOUTH ONCE DAILY   ondansetron (ZOFRAN-ODT) 4 MG disintegrating tablet Take 1 tablet (4 mg total) by mouth every 4 (four) hours as needed for nausea or vomiting.   rosuvastatin (CRESTOR) 5 MG tablet TAKE 1 TABLET BY MOUTH ONCE DAILY .   valACYclovir (VALTREX) 1000 MG tablet TAKE 1 TABLET BY MOUTH THREE TIMES DAILY AS DIRECTED   No facility-administered encounter medications on file as of 10/26/2021.   Physical Exam: Blood pressure 138/62, pulse (!) 102, temperature 98.3 F (36.8 C), temperature source Oral, height '5\' 3"'$  (1.6 m), weight 143 lb 12.8 oz (65.2 kg), SpO2 96 %. Gen:      No acute distress HEENT:  EOMI, sclera anicteric Neck:     No masses; no thyromegaly Lungs:    Clear to auscultation bilaterally; normal respiratory effort CV:         Regular rate and rhythm; no murmurs Abd:      + bowel sounds; soft, non-tender; no palpable masses, no distension Ext:    No edema; adequate peripheral perfusion Skin:      Warm and dry; no rash Neuro: alert and oriented x 3 Psych: normal mood and affect  Data Reviewed: Imaging: CT chest 04/20/2021-mild emphysema, no interstitial lung disease, coronary artery calcification CT abdomen pelvis 07/17/2021-visualized lung bases are normal I have reviewed the images personally.  PFTs: 07/08/2014 FVC 3.12 [98%], FEV1 2.47 [101%], F/F 79, TLC 5.14 [101%], DLCO 25.68 [105%] Normal test  Labs: CBC 03/24/2021-WBC 5.9, eos 5.5%, absolute eosinophil count 325 CBC 08/30/2021-WBC 7.8, eos 4.6%, absolute eosinophil count 359  IgE 08/30/2021 - 8 Alpha-1 antitrypsin 08/30/2021-187, PI MM  Assessment:  Chronic  cough Suspect combination of upper airway cough, GERD.  Symptoms improved with Flonase and chlorpheniramine She continues on Prilosec for GERD  Although she has emphysema on scan there is no overt obstruction on spirometry.  She does have mild overinflation and air trapping which is suggestive of small airway disease.  Since she is asymptomatic will observe off inhalers.  Discussed follow-up but she would like to return as needed since she is feeling well.  Plan/Recommendations: Flonase and antihistamine for postnasal drip PPI Follow-up as needed  Marshell Garfinkel MD [facility] 10/26/2021, 1:53 PM  CC: Panosh, Standley Brooking, MD

## 2021-10-26 NOTE — Progress Notes (Signed)
Full PFT Performed Today  

## 2021-10-26 NOTE — Patient Instructions (Signed)
Full PFT Performed Today  

## 2021-10-26 NOTE — Patient Instructions (Signed)
I am glad that you are feeling better I have reviewed your lung function test which shows normal lung capacity which is good news Return to clinic as needed

## 2021-11-01 DIAGNOSIS — L570 Actinic keratosis: Secondary | ICD-10-CM | POA: Diagnosis not present

## 2021-11-01 DIAGNOSIS — D485 Neoplasm of uncertain behavior of skin: Secondary | ICD-10-CM | POA: Diagnosis not present

## 2021-11-10 DIAGNOSIS — Z961 Presence of intraocular lens: Secondary | ICD-10-CM | POA: Diagnosis not present

## 2021-11-10 DIAGNOSIS — H524 Presbyopia: Secondary | ICD-10-CM | POA: Diagnosis not present

## 2021-11-10 DIAGNOSIS — Z9889 Other specified postprocedural states: Secondary | ICD-10-CM | POA: Diagnosis not present

## 2021-11-10 DIAGNOSIS — H52203 Unspecified astigmatism, bilateral: Secondary | ICD-10-CM | POA: Diagnosis not present

## 2021-11-28 DIAGNOSIS — Z0100 Encounter for examination of eyes and vision without abnormal findings: Secondary | ICD-10-CM | POA: Diagnosis not present

## 2021-11-29 DIAGNOSIS — M5134 Other intervertebral disc degeneration, thoracic region: Secondary | ICD-10-CM | POA: Diagnosis not present

## 2021-11-29 DIAGNOSIS — M9901 Segmental and somatic dysfunction of cervical region: Secondary | ICD-10-CM | POA: Diagnosis not present

## 2021-11-29 DIAGNOSIS — S138XXA Sprain of joints and ligaments of other parts of neck, initial encounter: Secondary | ICD-10-CM | POA: Diagnosis not present

## 2021-11-29 DIAGNOSIS — M9902 Segmental and somatic dysfunction of thoracic region: Secondary | ICD-10-CM | POA: Diagnosis not present

## 2021-12-13 ENCOUNTER — Ambulatory Visit (INDEPENDENT_AMBULATORY_CARE_PROVIDER_SITE_OTHER): Payer: Medicare HMO

## 2021-12-13 VITALS — Ht 63.0 in | Wt 140.0 lb

## 2021-12-13 DIAGNOSIS — Z0000 Encounter for general adult medical examination without abnormal findings: Secondary | ICD-10-CM | POA: Diagnosis not present

## 2021-12-13 NOTE — Progress Notes (Signed)
I connected with Jacqueline Myers today by telephone and verified that I am speaking with the correct person using two identifiers. Location patient: home Location provider: work Persons participating in the virtual visit: [place], Bucaro LPN.   I discussed the limitations, risks, security and privacy concerns of performing an evaluation and management service by telephone and the availability of in person appointments. I also discussed with the patient that there may be a patient responsible charge related to this service. The patient expressed understanding and verbally consented to this telephonic visit.    Interactive audio and video telecommunications were attempted between this provider and patient, however failed, due to patient having technical difficulties OR patient did not have access to video capability.  We continued and completed visit with audio only.     Vital signs may be patient reported or missing.  Subjective:   Jacqueline Myers is a 71 y.o. female who presents for Medicare Annual (Subsequent) preventive examination.  Review of Systems     Cardiac Risk Factors include: advanced age (>59mn, >>53women);dyslipidemia;hypertension     Objective:    Today's Vitals   12/13/21 1556  Weight: 140 lb (63.5 kg)  Height: '5\' 3"'$  (1.6 m)   Body mass index is 24.8 kg/m.     12/13/2021    3:59 PM 07/17/2021    3:39 PM 12/07/2020   11:20 AM 12/05/2019   10:42 AM 04/14/2015    3:40 PM 04/14/2015   10:49 AM 04/08/2015    1:25 PM  Advanced Directives  Does Patient Have a Medical Advance Directive? Yes No Yes Yes Yes Yes No  Type of Advance Directive Living will;Healthcare Power of [place]   Does patient want to make changes to medical advance directive?    No - Patient declined     Copy of  [place] Chart? Yes - validated most recent copy scanned in chart (See row information)  No - copy requested Yes - validated most recent copy scanned in chart (See row information) Yes Yes   Would patient like information on creating a medical advance directive?  No - Patient declined   No - patient declined information No - patient declined information Yes - Educational materials given    Current Medications (verified) Outpatient Encounter Medications as of 12/13/2021  Medication Sig   acetaminophen (TYLENOL) 500 MG tablet Take 500-1,000 mg by mouth every 6 (six) hours as needed for moderate pain.   Candesartan Cilexetil-HCTZ 32-25 MG TABS TAKE 1 TABLET BY MOUTH ONCE DAILY .   cholecalciferol (VITAMIN D3) 25 MCG (1000 UNIT) tablet Take 2,000-4,000 Units by mouth.   cyclobenzaprine (FLEXERIL) 10 MG tablet TAKE 1/2 TO 1 (ONE-HALF TO ONE) TABLET BY MOUTH THREE TIMES DAILY AS NEEDED FOR MUSCLE SPASM   diclofenac sodium (VOLTAREN) 1 % GEL Apply 4 g topically 4 (four) times daily.   levothyroxine (SYNTHROID) 50 MCG tablet TAKE 1/2 (ONE-HALF) TABLET BY MOUTH ONCE DAILY .   Omega-3 Fatty Acids (FISH OIL) 1200 MG CPDR Take 2 capsules by mouth daily.   omeprazole (PRILOSEC) 20 MG capsule TAKE 1 CAPSULE BY MOUTH ONCE DAILY   ondansetron (ZOFRAN-ODT) 4 MG disintegrating tablet Take 1 tablet (4 mg total) by mouth every 4 (four) hours as needed for nausea or vomiting.   rosuvastatin (CRESTOR) 5 MG tablet TAKE 1 TABLET BY MOUTH ONCE DAILY .  valACYclovir (VALTREX) 1000 MG tablet TAKE 1 TABLET BY MOUTH THREE TIMES DAILY AS DIRECTED   No facility-administered encounter medications on file as of 12/13/2021.    Allergies (verified) Morphine and related, Other, Statins, Lisinopril, Oxycodone, and Simvastatin   History: Past Medical History:  Diagnosis Date   Colon polyps    hyperplastic   Diverticulosis    Esophageal stricture    GERD (gastroesophageal reflux disease)    History of  bronchitis    Hyperlipidemia    Hypertension    Mitral valve prolapse    Osteoarthritis    Osteoporosis    Spinal stenosis    Past Surgical History:  Procedure Laterality Date   COLONOSCOPY     NM MYOVIEW LTD  08/2006   normal for abn ekg   TONSILLECTOMY     TOTAL KNEE ARTHROPLASTY Left 04/14/2015   Procedure: LEFT TOTAL KNEE ARTHROPLASTY;  Surgeon: Gaynelle Arabian, MD;  Location: [facility];  Service: Orthopedics;  Laterality: Left;   Family History  Problem Relation Age of Onset   Breast cancer Sister    Alcohol abuse Mother    Stroke Mother    Hypertension Mother    Diabetes Mother    Hyperlipidemia Mother    Hyperlipidemia Father    Colon cancer Neg Hx    Social History   Socioeconomic History   Marital status: Married    Spouse name: Not on file   Number of children: 2   Years of education: Not on file   Highest education level: Master's degree (e.g., MA, MS, MEng, MEd, MSW, MBA)  Occupational History   Occupation: Retired  Tobacco Use   Smoking status: Former    Packs/day: 1.00    Years: 29.00    Total pack years: 29.00    Types: Cigarettes    Quit date: 05/09/1999    Years since quitting: 22.6   Smokeless tobacco: Never  Vaping Use   Vaping Use: Never used  Substance and Sexual Activity   Alcohol use: Yes    Alcohol/week: 0.0 standard drinks of alcohol    Comment: OCC- [place] WINE   Drug use: No   Sexual activity: Not Currently  Other Topics Concern   Not on file  Social History Narrative   Married   [place] of 2 helps take care 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   Neg td neg ets   Social Determinants of Health   Financial Resource Strain: Low Risk  (12/13/2021)   Overall Financial Resource Strain (CARDIA)    Difficulty of Paying Living Expenses: Not hard at all  Food Insecurity: No Food Insecurity (12/13/2021)   Hunger Vital Sign     Worried About Running Out of Food in the Last Year: Never true    Ran Out of Food in the Last Year: Never true  Transportation Needs: No Transportation Needs (12/13/2021)   PRAPARE - Hydrologist (Medical): No    Lack of Transportation (Non-Medical): No  Physical Activity: Insufficiently Active (12/13/2021)   Exercise Vital Sign    Days of Exercise per Week: 3 days    Minutes of Exercise per Session: 30 min  Stress: No Stress Concern Present (12/13/2021)   [place]    Feeling of Stress : Only a little  Social Connections: Socially Integrated (  09/17/2021)   Social Connection and Isolation Panel [NHANES]    Frequency of Communication with Friends and Family: Twice a week    Frequency of Social Gatherings with Friends and Family: Twice a week    Attends Religious Services: More than 4 times per year    Active Member of [employer] or Organizations: Yes    Attends Music therapist: More than 4 times per year    Marital Status: Married    Tobacco Counseling Counseling given: Not Answered   Clinical Intake:  Pre-visit preparation completed: Yes  Pain : No/denies pain     Nutritional Status: BMI of 19-24  Normal Nutritional Risks: None Diabetes: No  How often do you need to have someone help you when you read instructions, pamphlets, or other written materials from your doctor or pharmacy?: 1 - Never What is the last grade level you completed in school?: Masters degree  Diabetic? no  Interpreter Needed?: No  Information entered by :: NAllen LPN   Activities of Daily Living    12/13/2021    4:00 PM 12/12/2021    6:25 PM  In your present state of health, do you have any difficulty performing the following activities:  Hearing? 0 0  Vision? 0 0  Difficulty concentrating or making decisions? 0 0  Walking or climbing stairs? 0 0  Dressing or bathing? 0 0  Doing errands,  shopping? 0 0  Preparing Food and eating ? N N  Using the Toilet? N N  In the past six months, have you accidently leaked urine? N N  Do you have problems with loss of bowel control? N N  Managing your Medications? N N  Managing your Finances? N N  Housekeeping or managing your Housekeeping? N N    Patient Care Team: Panosh, Standley Brooking, MD as PCP - General Skeet Latch, MD as PCP - Cardiology (Cardiology) Irene Shipper, MD (Gastroenterology) Gaynelle Arabian, MD as Consulting Physician (Orthopedic Surgery) Susa Day, MD as Consulting Physician (Orthopedic Surgery)  Indicate any recent Medical Services you may have received from other than [facility] providers in the past year (date may be approximate).     Assessment:   This is a routine wellness examination for [place].  Hearing/Vision screen Vision Screening - Comments:: Regular eye exams, Dr. Gershon Crane  Dietary issues and exercise activities discussed: Current Exercise Habits: Home exercise routine, Type of exercise: Other - see comments (recumbent bike), Time (Minutes): 30   Goals Addressed             This Visit's Progress    Patient Stated       12/13/2021, no goals       Depression Screen    12/13/2021    4:00 PM 09/21/2021   11:05 AM 12/07/2020   11:21 AM 12/07/2020   11:19 AM 12/05/2019   10:44 AM 09/24/2019    9:55 AM 06/21/2017    8:35 AM  PHQ 2/9 Scores  PHQ - 2 Score 0 0 0 0 0 0 0  PHQ- 9 Score  3   0 0     Fall Risk    12/13/2021    4:00 PM 12/12/2021    6:25 PM 09/21/2021   11:04 AM 09/17/2021    9:55 AM 12/07/2020   11:21 AM  Fall Risk   Falls in the past year? 0 0 1 0 0  Number falls in past yr: 0 0 1  0  Injury with Fall? 0 0 0  0  Risk for fall due to : Medication side effect  Other (Comment)    Follow up Education provided;Falls prevention discussed;Falls evaluation completed  Falls evaluation completed  Falls evaluation completed    FALL RISK PREVENTION PERTAINING TO THE HOME:  Any stairs in or  around the home? Yes  If so, are there any without handrails? No  Home free of loose throw rugs in walkways, pet beds, electrical cords, etc? Yes  Adequate lighting in your home to reduce risk of falls? Yes   ASSISTIVE DEVICES UTILIZED TO PREVENT FALLS:  Life alert? No  Use of a cane, walker or w/c? No  Grab bars in the bathroom? Yes  Shower chair or bench in shower? Yes  Elevated toilet seat or a handicapped toilet? No   TIMED UP AND GO:  Was the test performed? No .      Cognitive Function:        12/13/2021    4:01 PM 12/05/2019   10:46 AM  6CIT Screen  What Year? 0 points 0 points  What month? 0 points 0 points  What time? 0 points 0 points  Count back from 20 0 points 0 points  Months in reverse 0 points 0 points  Repeat phrase 0 points 0 points  Total Score 0 points 0 points    Immunizations Immunization History  Administered Date(s) Administered   Fluad Quad(high Dose 65+) 01/15/2019, 02/21/2021   Influenza Split 04/20/2011, 03/08/2012, 03/12/2013   Influenza Whole 04/28/2009, 05/04/2010   Influenza, High Dose Seasonal PF 02/08/2015, 01/13/2016, 01/30/2017, 01/24/2018, 01/29/2020   Influenza,inj,Quad PF,6+ Mos 12/29/2013   Influenza,inj,quad, With Preservative 01/06/2017   PFIZER(Purple Top)SARS-COV-2 Vaccination 06/16/2019, 07/11/2019, 08/23/2020   Pfizer Covid-19 Vaccine Bivalent Booster 64yr & up 09/22/2021   Pneumococcal Conjugate-13 06/28/2015   Pneumococcal Polysaccharide-23 06/20/2016   Pneumococcal-Unspecified 05/09/2016   Td 05/08/2004   Tdap 07/03/2014   Zoster Recombinat (Shingrix) 02/21/2021, 05/22/2021   Zoster, Live 06/28/2011    TDAP status: Up to date  Flu Vaccine status: Due, Education has been provided regarding the importance of this vaccine. Advised may receive this vaccine at local pharmacy or Health Dept. Aware to provide a copy of the vaccination record if obtained from local pharmacy or Health Dept. Verbalized acceptance and  understanding.  Pneumococcal vaccine status: Up to date  Covid-19 vaccine status: Completed vaccines  Qualifies for Shingles Vaccine? Yes   Zostavax completed Yes   Shingrix Completed?: Yes  Screening Tests Health Maintenance  Topic Date Due   INFLUENZA VACCINE  12/06/2021   MAMMOGRAM  08/09/2022   COLONOSCOPY (Pts [phone] coverage will need to be confirmed)  01/17/2023   TETANUS/TDAP  07/03/2024   Pneumonia Vaccine 6537Years old  Completed   DEXA SCAN  Completed   COVID-19 Vaccine  Completed   Hepatitis C Screening  Completed   Zoster Vaccines- Shingrix  Completed   HPV VACCINES  Aged Out    Health Maintenance  Health Maintenance Due  Topic Date Due   INFLUENZA VACCINE  12/06/2021    Colorectal cancer screening: Type of screening: Colonoscopy. Completed 01/16/2013. Repeat every 10 years  Mammogram status: Completed 08/08/2021. Repeat every year  Bone Density status: Completed 11/16/2020.   Lung Cancer Screening: (Low Dose CT Chest recommended if Age 72-80ears, 30 pack-year currently smoking OR have quit w/in 15years.) does not qualify.   Lung Cancer Screening Referral: no  Additional Screening:  Hepatitis C Screening: does qualify; Completed 06/20/2016  Vision Screening: Recommended annual ophthalmology  exams for early detection of glaucoma and other disorders of the eye. Is the patient up to date with their annual eye exam?  Yes  Who is the provider or what is the name of the office in which the patient attends annual eye exams? Dr. Gershon Crane If pt is not established with a provider, would they like to be referred to a provider to establish care? No .   Dental Screening: Recommended annual dental exams for proper oral hygiene  Community Resource Referral / Chronic Care Management: CRR required this visit?  No   CCM required this visit?  No      Plan:     I have personally reviewed and noted the following in the patient's chart:   Medical and  social history Use of alcohol, tobacco or illicit drugs  Current medications and supplements including opioid prescriptions.  Functional ability and status Nutritional status Physical activity Advanced directives List of other physicians Hospitalizations, surgeries, and ER visits in previous 12 months Vitals Screenings to include cognitive, depression, and falls Referrals and appointments  In addition, I have reviewed and discussed with patient certain preventive protocols, quality metrics, and best practice recommendations. A written personalized care plan for preventive services as well as general preventive health recommendations were provided to patient.     Kellie Simmering, LPN   08/12/9627   Nurse Notes: none  Due to this being a virtual visit, the after visit summary with patients personalized plan was offered to patient via mail or my-chart.  Patient would like to access on my-chart

## 2021-12-13 NOTE — Patient Instructions (Signed)
Jacqueline Myers , Thank you for taking time to come for your Medicare Wellness Visit. I appreciate your ongoing commitment to your health goals. Please review the following plan we discussed and let me know if I can assist you in the future.   Screening recommendations/referrals: Colonoscopy: completed 01/16/2013, due 01/17/2023 Mammogram: completed 08/08/2021, due 08/10/2022 Bone Density: completed 11/16/2020 Recommended yearly ophthalmology/optometry visit for glaucoma screening and checkup Recommended yearly dental visit for hygiene and checkup  Vaccinations: Influenza vaccine: due Pneumococcal vaccine: completed 06/20/2016 Tdap vaccine: completed 07/03/2014, due 07/03/2024 Shingles vaccine: completed   Covid-19: 09/22/2021, 08/23/2020, 07/11/2019, 06/16/2019  Advanced directives: copy in chart  Conditions/risks identified: none  Next appointment: Follow up in one year for your annual wellness visit    Preventive Care 72 Years and Older, Female Preventive care refers to lifestyle choices and visits with your health care provider that can promote health and wellness. What does preventive care include? A yearly physical exam. This is also called an annual well check. Dental exams once or twice a year. Routine eye exams. Ask your health care provider how often you should have your eyes checked. Personal lifestyle choices, including: Daily care of your teeth and gums. Regular physical activity. Eating a healthy diet. Avoiding tobacco and drug use. Limiting alcohol use. Practicing safe sex. Taking low-dose aspirin every day. Taking vitamin and mineral supplements as recommended by your health care provider. What happens during an annual well check? The services and screenings done by your health care provider during your annual well check will depend on your age, overall health, lifestyle risk factors, and family history of disease. Counseling  Your health care provider may ask you questions  about your: Alcohol use. Tobacco use. Drug use. Emotional well-being. Home and relationship well-being. Sexual activity. Eating habits. History of falls. Memory and ability to understand (cognition). Work and work Statistician. Reproductive health. Screening  You may have the following tests or measurements: Height, weight, and BMI. Blood pressure. Lipid and cholesterol levels. These may be checked every 5 years, or more frequently if you are over 44 years old. Skin check. Lung cancer screening. You may have this screening every year starting at age 22 if you have a 30-pack-year history of smoking and currently smoke or have quit within the past 15 years. Fecal occult blood test (FOBT) of the stool. You may have this test every year starting at age 8. Flexible sigmoidoscopy or colonoscopy. You may have a sigmoidoscopy every 5 years or a colonoscopy every 10 years starting at age 61. Hepatitis C blood test. Hepatitis B blood test. Sexually transmitted disease (STD) testing. Diabetes screening. This is done by checking your blood sugar (glucose) after you have not eaten for a while (fasting). You may have this done every 1-3 years. Bone density scan. This is done to screen for osteoporosis. You may have this done starting at age 58. Mammogram. This may be done every 1-2 years. Talk to your health care provider about how often you should have regular mammograms. Talk with your health care provider about your test results, treatment options, and if necessary, the need for more tests. Vaccines  Your health care provider may recommend certain vaccines, such as: Influenza vaccine. This is recommended every year. Tetanus, diphtheria, and acellular pertussis (Tdap, Td) vaccine. You may need a Td booster every 10 years. Zoster vaccine. You may need this after age 23. Pneumococcal 13-valent conjugate (PCV13) vaccine. One dose is recommended after age 30. Pneumococcal polysaccharide (PPSV23)  vaccine. One  dose is recommended after age 38. Talk to your health care provider about which screenings and vaccines you need and how often you need them. This information is not intended to replace advice given to you by your health care provider. Make sure you discuss any questions you have with your health care provider. Document Released: 05/21/2015 Document Revised: 01/12/2016 Document Reviewed: 02/23/2015 Elsevier Interactive Patient Education  2017 [employer] Prevention in the Home Falls can cause injuries. They can happen to people of all ages. There are many things you can do to make your home safe and to help prevent falls. What can I do on the outside of my home? Regularly fix the edges of walkways and driveways and fix any cracks. Remove anything that might make you trip as you walk through a door, such as a raised step or threshold. Trim any bushes or trees on the path to your home. Use bright outdoor lighting. Clear any walking paths of anything that might make someone trip, such as rocks or tools. Regularly check to see if handrails are loose or broken. Make sure that both sides of any steps have handrails. Any raised decks and porches should have guardrails on the edges. Have any leaves, snow, or ice cleared regularly. Use sand or salt on walking paths during winter. Clean up any spills in your garage right away. This includes oil or grease spills. What can I do in the bathroom? Use night lights. Install grab bars by the toilet and in the tub and shower. Do not use towel bars as grab bars. Use non-skid mats or decals in the tub or shower. If you need to sit down in the shower, use a plastic, non-slip stool. Keep the floor dry. Clean up any water that spills on the floor as soon as it happens. Remove soap buildup in the tub or shower regularly. Attach bath mats securely with double-sided non-slip rug tape. Do not have throw rugs and other things on the floor that  can make you trip. What can I do in the bedroom? Use night lights. Make sure that you have a light by your bed that is easy to reach. Do not use any sheets or blankets that are too big for your bed. They should not hang down onto the floor. Have a firm chair that has side arms. You can use this for support while you get dressed. Do not have throw rugs and other things on the floor that can make you trip. What can I do in the kitchen? Clean up any spills right away. Avoid walking on wet floors. Keep items that you use a lot in easy-to-reach places. If you need to reach something above you, use a strong step stool that has a grab bar. Keep electrical cords out of the way. Do not use floor polish or wax that makes floors slippery. If you must use wax, use non-skid floor wax. Do not have throw rugs and other things on the floor that can make you trip. What can I do with my stairs? Do not leave any items on the stairs. Make sure that there are handrails on both sides of the stairs and use them. Fix handrails that are broken or loose. Make sure that handrails are as long as the stairways. Check any carpeting to make sure that it is firmly attached to the stairs. Fix any carpet that is loose or worn. Avoid having throw rugs at the top or bottom of the stairs.  If you do have throw rugs, attach them to the floor with carpet tape. Make sure that you have a light switch at the top of the stairs and the bottom of the stairs. If you do not have them, ask someone to add them for you. What else can I do to help prevent falls? Wear shoes that: Do not have high heels. Have rubber bottoms. Are comfortable and fit you well. Are closed at the toe. Do not wear sandals. If you use a stepladder: Make sure that it is fully opened. Do not climb a closed stepladder. Make sure that both sides of the stepladder are locked into place. Ask someone to hold it for you, if possible. Clearly mark and make sure that you  can see: Any grab bars or handrails. First and last steps. Where the edge of each step is. Use tools that help you move around (mobility aids) if they are needed. These include: Canes. Walkers. Scooters. Crutches. Turn on the lights when you go into a dark area. Replace any light bulbs as soon as they burn out. Set up your furniture so you have a clear path. Avoid moving your furniture around. If any of your floors are uneven, fix them. If there are any pets around you, be aware of where they are. Review your medicines with your doctor. Some medicines can make you feel dizzy. This can increase your chance of falling. Ask your doctor what other things that you can do to help prevent falls. This information is not intended to replace advice given to you by your health care provider. Make sure you discuss any questions you have with your health care provider. Document Released: 02/18/2009 Document Revised: 09/30/2015 Document Reviewed: 05/29/2014 Elsevier Interactive Patient Education  2017 [employer].

## 2021-12-21 DIAGNOSIS — B351 Tinea unguium: Secondary | ICD-10-CM | POA: Diagnosis not present

## 2021-12-21 DIAGNOSIS — L600 Ingrowing nail: Secondary | ICD-10-CM | POA: Diagnosis not present

## 2021-12-23 DIAGNOSIS — M9905 Segmental and somatic dysfunction of pelvic region: Secondary | ICD-10-CM | POA: Diagnosis not present

## 2021-12-23 DIAGNOSIS — S39012A Strain of muscle, fascia and tendon of lower back, initial encounter: Secondary | ICD-10-CM | POA: Diagnosis not present

## 2021-12-23 DIAGNOSIS — S338XXA Sprain of other parts of lumbar spine and pelvis, initial encounter: Secondary | ICD-10-CM | POA: Diagnosis not present

## 2021-12-23 DIAGNOSIS — M9901 Segmental and somatic dysfunction of cervical region: Secondary | ICD-10-CM | POA: Diagnosis not present

## 2021-12-23 DIAGNOSIS — S138XXA Sprain of joints and ligaments of other parts of neck, initial encounter: Secondary | ICD-10-CM | POA: Diagnosis not present

## 2021-12-23 DIAGNOSIS — M5134 Other intervertebral disc degeneration, thoracic region: Secondary | ICD-10-CM | POA: Diagnosis not present

## 2021-12-23 DIAGNOSIS — M9902 Segmental and somatic dysfunction of thoracic region: Secondary | ICD-10-CM | POA: Diagnosis not present

## 2021-12-23 DIAGNOSIS — M9903 Segmental and somatic dysfunction of lumbar region: Secondary | ICD-10-CM | POA: Diagnosis not present

## 2022-01-03 DIAGNOSIS — M5134 Other intervertebral disc degeneration, thoracic region: Secondary | ICD-10-CM | POA: Diagnosis not present

## 2022-01-03 DIAGNOSIS — M9901 Segmental and somatic dysfunction of cervical region: Secondary | ICD-10-CM | POA: Diagnosis not present

## 2022-01-03 DIAGNOSIS — M9902 Segmental and somatic dysfunction of thoracic region: Secondary | ICD-10-CM | POA: Diagnosis not present

## 2022-01-03 DIAGNOSIS — S138XXA Sprain of joints and ligaments of other parts of neck, initial encounter: Secondary | ICD-10-CM | POA: Diagnosis not present

## 2022-01-03 DIAGNOSIS — S338XXA Sprain of other parts of lumbar spine and pelvis, initial encounter: Secondary | ICD-10-CM | POA: Diagnosis not present

## 2022-01-03 DIAGNOSIS — M9905 Segmental and somatic dysfunction of pelvic region: Secondary | ICD-10-CM | POA: Diagnosis not present

## 2022-01-03 DIAGNOSIS — M9903 Segmental and somatic dysfunction of lumbar region: Secondary | ICD-10-CM | POA: Diagnosis not present

## 2022-01-03 DIAGNOSIS — S39012A Strain of muscle, fascia and tendon of lower back, initial encounter: Secondary | ICD-10-CM | POA: Diagnosis not present

## 2022-01-05 DIAGNOSIS — L905 Scar conditions and fibrosis of skin: Secondary | ICD-10-CM | POA: Diagnosis not present

## 2022-01-06 ENCOUNTER — Other Ambulatory Visit: Payer: Self-pay | Admitting: Internal Medicine

## 2022-01-13 ENCOUNTER — Encounter: Payer: Self-pay | Admitting: Internal Medicine

## 2022-01-17 DIAGNOSIS — S338XXA Sprain of other parts of lumbar spine and pelvis, initial encounter: Secondary | ICD-10-CM | POA: Diagnosis not present

## 2022-01-17 DIAGNOSIS — M9902 Segmental and somatic dysfunction of thoracic region: Secondary | ICD-10-CM | POA: Diagnosis not present

## 2022-01-17 DIAGNOSIS — M9903 Segmental and somatic dysfunction of lumbar region: Secondary | ICD-10-CM | POA: Diagnosis not present

## 2022-01-17 DIAGNOSIS — S138XXA Sprain of joints and ligaments of other parts of neck, initial encounter: Secondary | ICD-10-CM | POA: Diagnosis not present

## 2022-01-17 DIAGNOSIS — M5134 Other intervertebral disc degeneration, thoracic region: Secondary | ICD-10-CM | POA: Diagnosis not present

## 2022-01-17 DIAGNOSIS — M9905 Segmental and somatic dysfunction of pelvic region: Secondary | ICD-10-CM | POA: Diagnosis not present

## 2022-01-17 DIAGNOSIS — S39012A Strain of muscle, fascia and tendon of lower back, initial encounter: Secondary | ICD-10-CM | POA: Diagnosis not present

## 2022-01-17 DIAGNOSIS — M9901 Segmental and somatic dysfunction of cervical region: Secondary | ICD-10-CM | POA: Diagnosis not present

## 2022-01-30 ENCOUNTER — Encounter: Payer: Self-pay | Admitting: Family Medicine

## 2022-01-30 ENCOUNTER — Ambulatory Visit (INDEPENDENT_AMBULATORY_CARE_PROVIDER_SITE_OTHER): Payer: Medicare HMO | Admitting: Family Medicine

## 2022-01-30 VITALS — BP 122/60 | HR 96 | Temp 98.8°F | Resp 16 | Ht 63.0 in | Wt 148.0 lb

## 2022-01-30 DIAGNOSIS — N390 Urinary tract infection, site not specified: Secondary | ICD-10-CM

## 2022-01-30 DIAGNOSIS — R300 Dysuria: Secondary | ICD-10-CM

## 2022-01-30 DIAGNOSIS — M545 Low back pain, unspecified: Secondary | ICD-10-CM

## 2022-01-30 LAB — URINALYSIS, ROUTINE W REFLEX MICROSCOPIC

## 2022-01-30 MED ORDER — NITROFURANTOIN MONOHYD MACRO 100 MG PO CAPS: 100.0000 mg | ORAL_CAPSULE | Freq: Two times a day (BID) | ORAL | 0 refills | Status: AC

## 2022-01-30 NOTE — Patient Instructions (Addendum)
A few things to remember from today's visit:   Dysuria  Urinary tract infection without hematuria, site unspecified - Plan: nitrofurantoin, macrocrystal-monohydrate, (MACROBID) 100 MG capsule  Right-sided low back pain without sciatica, unspecified chronicity  Adequate fluid intake, avoid holding urine for long hours, and over the counter Vit C OR cranberry capsules might help.  Today we will treat empirically with antibiotic, which we might need to change when urine culture comes back depending of bacteria susceptibility.  Seek immediate medical attention if severe abdominal pain, vomiting, fever/chills, or worsening symptoms. F/U if symptomatic are not any better after 2-3 days of antibiotic treatment.  Please be sure medication list is accurate. If a new problem present, please set up appointment sooner than planned today.

## 2022-01-30 NOTE — Progress Notes (Signed)
ACUTE VISIT Chief Complaint  Patient presents with   Urinary Tract Infection    Started Saturday night, urgency & burning. Has been using OTC medication for UTI's since Sunday. Back pain.    HPI: [name] is a 71 y.o. female with medical history significant for voiding dysfunction/urinary urgency, spinal stenosis, hypertension, and hyperlipidemia here today complaining of 2 days of urinary symptoms as described above. Mild suprapubic pressure sensation, not radiated. Dysuria  This is a new problem. The current episode started in the past 7 days. The problem has been unchanged. The quality of the pain is described as burning. The pain is mild. There has been no fever. She is Not sexually active. There is No history of pyelonephritis. Associated symptoms include frequency, hesitancy and urgency. Pertinent negatives include no chills, discharge, flank pain, hematuria, nausea, sweats or vomiting.  Nocturia x12 last night. Vaginal pruritus. Right-sided back pain, she has hx of lower back pain but this pain is different.  Negative for saddle anesthesia or changes in bowel/bladder function.  She took Azo. UTI's in the past, last one a couple of years ago.  Ucx in 09/2018 Single organism less than 10,000 CFU/mL isolated.  Review of Systems  Constitutional:  Positive for fatigue. Negative for chills.  Gastrointestinal:  Negative for nausea and vomiting.       No changes in bowel habits.  Endocrine: Negative for polydipsia and polyphagia.  Genitourinary:  Positive for dysuria, frequency, hesitancy and urgency. Negative for flank pain and hematuria.  Neurological:  Negative for syncope and weakness.  Psychiatric/Behavioral:  Negative for confusion. The patient is not nervous/anxious.   Rest see pertinent positives and negatives per HPI.  Current Outpatient Medications on File Prior to Visit  Medication Sig Dispense Refill   acetaminophen (TYLENOL) 500 MG tablet Take 500-1,000  mg by mouth every 6 (six) hours as needed for moderate pain.     Candesartan Cilexetil-HCTZ 32-25 MG TABS TAKE 1 TABLET BY MOUTH ONCE DAILY . 90 tablet 3   cholecalciferol (VITAMIN D3) 25 MCG (1000 UNIT) tablet Take 2,000-4,000 Units by mouth.     cyclobenzaprine (FLEXERIL) 10 MG tablet TAKE 1/2 TO 1 (ONE-HALF TO ONE) TABLET BY MOUTH THREE TIMES DAILY AS NEEDED FOR MUSCLE SPASM 30 tablet 0   diclofenac sodium (VOLTAREN) 1 % GEL Apply 4 g topically 4 (four) times daily. 100 g 2   levothyroxine (SYNTHROID) 50 MCG tablet TAKE 1/2 (ONE-HALF) TABLET BY MOUTH ONCE DAILY . 45 tablet 3   Omega-3 Fatty Acids (FISH OIL) 1200 MG CPDR Take 2 capsules by mouth daily.     omeprazole (PRILOSEC) 20 MG capsule TAKE 1 CAPSULE BY MOUTH ONCE DAILY 90 capsule 3   ondansetron (ZOFRAN-ODT) 4 MG disintegrating tablet Take 1 tablet (4 mg total) by mouth every 4 (four) hours as needed for nausea or vomiting. 20 tablet 0   rosuvastatin (CRESTOR) 5 MG tablet TAKE 1 TABLET BY MOUTH ONCE DAILY . 90 tablet 3   valACYclovir (VALTREX) 1000 MG tablet TAKE 1 TABLET BY MOUTH THREE TIMES DAILY AS DIRECTED 30 tablet 0   No current facility-administered medications on file prior to visit.   Past Medical History:  Diagnosis Date   Colon polyps    hyperplastic   Diverticulosis    Esophageal stricture    GERD (gastroesophageal reflux disease)    History of bronchitis    Hyperlipidemia    Hypertension    Mitral valve prolapse    Osteoarthritis    Osteoporosis  Spinal stenosis    Allergies  Allergen Reactions   Other Other (See Comments)    Joint pain. Patient denies being allergic to anything else.simvastatin    Lisinopril Cough    Tolerates losartan   Oxycodone Nausea Only and Palpitations    Change in mental status, camps, anxiety, chills   Simvastatin Other (See Comments)    Myalgias   Social History   Socioeconomic History   Marital status: Married    Spouse name: Not on file   Number of children: 2   Years  of education: Not on file   Highest education level: Master's degree (e.g., MA, MS, MEng, MEd, MSW, MBA)  Occupational History   Occupation: Retired  Tobacco Use   Smoking status: Former    Packs/day: 1.00    Years: 29.00    Total pack years: 29.00    Types: Cigarettes    Quit date: 05/09/1999    Years since quitting: 22.7   Smokeless tobacco: Never  Vaping Use   Vaping Use: Never used  Substance and Sexual Activity   Alcohol use: Yes    Alcohol/week: 0.0 standard drinks of alcohol    Comment: [facility]- [place]   Drug use: No   Sexual activity: Not Currently  Other Topics Concern   Not on file  Social History Narrative   Married   [place] of 2 helps take care 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   Neg td neg ets   Social Determinants of Health   Financial Resource Strain: Low Risk  (12/13/2021)   Overall Financial Resource Strain (CARDIA)    Difficulty of Paying Living Expenses: Not hard at all  Food Insecurity: No Food Insecurity (12/13/2021)   Hunger Vital Sign    Worried About Running Out of Food in the Last Year: Never true    Ran Out of Food in the Last Year: Never true  Transportation Needs: No Transportation Needs (12/13/2021)   PRAPARE - Hydrologist (Medical): No    Lack of Transportation (Non-Medical): No  Physical Activity: Insufficiently Active (12/13/2021)   Exercise Vital Sign    Days of Exercise per Week: 3 days    Minutes of Exercise per Session: 30 min  Stress: No Stress Concern Present (12/13/2021)   [place]    Feeling of Stress : Only a little  Social Connections: Socially Integrated (09/17/2021)   Social Connection and Isolation Panel [NHANES]    Frequency of Communication with Friends and Family: Twice a week    Frequency of Social Gatherings  with Friends and Family: Twice a week    Attends Religious Services: More than 4 times per year    Active Member of Clubs or Organizations: Yes    Attends Archivist Meetings: More than 4 times per year    Marital Status: Married   Vitals:   01/30/22 1148 01/30/22 1219  BP: 122/60   Pulse: (!) 104 96  Resp: 16   Temp: 98.8 F (37.1 C)   SpO2: 97%    Body mass index is 26.22 kg/m.  Physical Exam Vitals and nursing note reviewed.  Constitutional:      General: She is not in acute distress.    Appearance: She is well-developed and well-groomed.  HENT:     Head:  Normocephalic and atraumatic.  Eyes:     Conjunctiva/sclera: Conjunctivae normal.  Cardiovascular:     Rate and Rhythm: Normal rate and regular rhythm.     Heart sounds: No murmur heard. Pulmonary:     Effort: Pulmonary effort is normal. No respiratory distress.     Breath sounds: Normal breath sounds.  Abdominal:     Palpations: Abdomen is soft. There is no mass.     Tenderness: There is no abdominal tenderness. There is no right CVA tenderness or left CVA tenderness.  Musculoskeletal:     Thoracic back: No tenderness.     Lumbar back: No tenderness.     Right lower leg: No edema.     Left lower leg: No edema.  Lymphadenopathy:     Cervical: No cervical adenopathy.  Skin:    General: Skin is warm.     Findings: No erythema.  Neurological:     General: No focal deficit present.     Mental Status: She is alert and oriented to person, place, and time.     Gait: Gait normal.  Psychiatric:        Mood and Affect: Mood and affect normal.   ASSESSMENT AND PLAN:  [name] was seen today for urinary tract infection.  Diagnoses and all orders for this visit:  Dysuria Hx of urinary urgency but this is a new symptom. We discussed differential diagnosis, including vaginal atrophy. Could not collect urine sample until we completed visit. We will follow UA and Ucx.  Urinary tract infection without  hematuria, site unspecified Will treat empirically with antibiotic and will tailor treatment according to urine culture and susceptibility report. Continue adequate hydration. Clearly instructed about warning signs. F/U if symptoms persist.  -     nitrofurantoin, macrocrystal-monohydrate, (MACROBID) 100 MG capsule; Take 1 capsule (100 mg total) by mouth 2 (two) times daily for 5 days.  Right-sided low back pain without sciatica, unspecified chronicity Could be musculoskeletal, hx of back pain. She can take Tylenol 500 mg 3-4 times per day as needed.  Return if symptoms worsen or fail to improve.  Jacqueline Myers G. [place], MD  [facility]. [place] office.

## 2022-01-31 DIAGNOSIS — M9901 Segmental and somatic dysfunction of cervical region: Secondary | ICD-10-CM | POA: Diagnosis not present

## 2022-01-31 DIAGNOSIS — S39012A Strain of muscle, fascia and tendon of lower back, initial encounter: Secondary | ICD-10-CM | POA: Diagnosis not present

## 2022-01-31 DIAGNOSIS — M9903 Segmental and somatic dysfunction of lumbar region: Secondary | ICD-10-CM | POA: Diagnosis not present

## 2022-01-31 DIAGNOSIS — S138XXA Sprain of joints and ligaments of other parts of neck, initial encounter: Secondary | ICD-10-CM | POA: Diagnosis not present

## 2022-01-31 DIAGNOSIS — M9905 Segmental and somatic dysfunction of pelvic region: Secondary | ICD-10-CM | POA: Diagnosis not present

## 2022-01-31 DIAGNOSIS — S338XXA Sprain of other parts of lumbar spine and pelvis, initial encounter: Secondary | ICD-10-CM | POA: Diagnosis not present

## 2022-01-31 DIAGNOSIS — M5134 Other intervertebral disc degeneration, thoracic region: Secondary | ICD-10-CM | POA: Diagnosis not present

## 2022-01-31 DIAGNOSIS — M9902 Segmental and somatic dysfunction of thoracic region: Secondary | ICD-10-CM | POA: Diagnosis not present

## 2022-02-02 LAB — URINE CULTURE
MICRO NUMBER:: 13964230
SPECIMEN QUALITY:: ADEQUATE

## 2022-02-06 ENCOUNTER — Ambulatory Visit (INDEPENDENT_AMBULATORY_CARE_PROVIDER_SITE_OTHER): Payer: Medicare HMO | Admitting: Family Medicine

## 2022-02-06 ENCOUNTER — Encounter: Payer: Self-pay | Admitting: Family Medicine

## 2022-02-06 VITALS — BP 118/62 | HR 97 | Temp 98.5°F | Resp 16 | Ht 63.0 in | Wt 148.5 lb

## 2022-02-06 DIAGNOSIS — R300 Dysuria: Secondary | ICD-10-CM | POA: Diagnosis not present

## 2022-02-06 DIAGNOSIS — R3915 Urgency of urination: Secondary | ICD-10-CM | POA: Diagnosis not present

## 2022-02-06 DIAGNOSIS — R102 Pelvic and perineal pain: Secondary | ICD-10-CM | POA: Diagnosis not present

## 2022-02-06 LAB — POCT URINALYSIS DIPSTICK
Bilirubin, UA: NEGATIVE
Blood, UA: NEGATIVE
Glucose, UA: NEGATIVE
Ketones, UA: NEGATIVE
Nitrite, UA: NEGATIVE
Protein, UA: NEGATIVE
Urobilinogen, UA: 0.2 E.U./dL
pH, UA: 7 (ref 5.0–8.0)

## 2022-02-06 MED ORDER — SULFAMETHOXAZOLE-TRIMETHOPRIM 800-160 MG PO TABS: 1.0000 | ORAL_TABLET | Freq: Two times a day (BID) | ORAL | 0 refills | Status: AC

## 2022-02-06 NOTE — Progress Notes (Signed)
ACUTE VISIT Chief Complaint  Patient presents with   Urinary Tract Infection   HPI: [name] is a 71 y.o. female, voiding dysfunction/urinary urgency, spinal stenosis, hypertension, and hyperlipidemia here today complaining of persistent urinary symptoms. She was seen on 01/30/22, when she was c/o dysuria and urgency. Empiric treatment with Macrobid started. Symptoms improved but did not resolved. Mild dysuria. Nocturia x 4. She has not noted fever or chills. Back pain has resolved.  Abdominal Pain This is a recurrent problem. The current episode started 1 to 4 weeks ago. The problem occurs constantly. The problem has been unchanged. The pain is located in the suprapubic region. The pain is moderate. Associated symptoms include dysuria and frequency. Pertinent negatives include no fever, headaches, hematuria, myalgias, nausea, vomiting or weight loss. Nothing aggravates the pain. The pain is relieved by Nothing. She has tried antibiotics for the symptoms. The treatment provided mild relief. Her past medical history is significant for GERD.   Component 7 d ago  MICRO NUMBER: [id number]   SPECIMEN QUALITY: Adequate   Sample Source URINE   STATUS: FINAL   ISOLATE 1: Escherichia coli Abnormal    Comment: Greater than 100,000 CFU/mL of Escherichia coli  Resulting Agency [facility] [place]     Susceptibility   Escherichia coli    URINE CULTURE, REFLEX    AMOX/CLAVULANIC 16  Intermediate    AMPICILLIN >=32  Resistant    AMPICILLIN/SULBACTAM >=32  Resistant    CEFAZOLIN <=4  Not Reportable 1    CEFEPIME <=1  Sensitive    CEFTAZIDIME <=1  Sensitive    CEFTRIAXONE <=1  Sensitive    CIPROFLOXACIN <=0.25  Sensitive    GENTAMICIN <=1  Sensitive    IMIPENEM <=0.25  Sensitive    LEVOFLOXACIN <=0.12  Sensitive    NITROFURANTOIN <=16  Sensitive    PIP/TAZO <=4  Sensitive    TOBRAMYCIN <=1  Sensitive    TRIMETH/SULFA <=20  Sensitive           Suprapubic  abdominal pressure pain is not radiated, moderate. Not identified exacerbating or alleviating factors. No changes in bowel habits, blood in stool,or melena.  Review of Systems  Constitutional:  Positive for fatigue. Negative for chills, fever and weight loss.  Respiratory:  Negative for cough and shortness of breath.   Gastrointestinal:  Positive for abdominal pain. Negative for nausea and vomiting.  Genitourinary:  Positive for dysuria and frequency. Negative for hematuria, vaginal bleeding and vaginal discharge.  Musculoskeletal:  Negative for back pain and myalgias.  Skin:  Negative for rash.  Neurological:  Negative for syncope and headaches.  Psychiatric/Behavioral:  Negative for confusion.   Rest see pertinent positives and negatives per HPI.  Current Outpatient Medications on File Prior to Visit  Medication Sig Dispense Refill   acetaminophen (TYLENOL) 500 MG tablet Take 500-1,000 mg by mouth every 6 (six) hours as needed for moderate pain.     Candesartan Cilexetil-HCTZ 32-25 MG TABS TAKE 1 TABLET BY MOUTH ONCE DAILY . 90 tablet 3   cholecalciferol (VITAMIN D3) 25 MCG (1000 UNIT) tablet Take 2,000-4,000 Units by mouth.     cyclobenzaprine (FLEXERIL) 10 MG tablet TAKE 1/2 TO 1 (ONE-HALF TO ONE) TABLET BY MOUTH THREE TIMES DAILY AS NEEDED FOR MUSCLE SPASM 30 tablet 0   diclofenac sodium (VOLTAREN) 1 % GEL Apply 4 g topically 4 (four) times daily. 100 g 2   levothyroxine (SYNTHROID) 50 MCG tablet TAKE 1/2 (ONE-HALF) TABLET BY MOUTH ONCE DAILY .  45 tablet 3   Omega-3 Fatty Acids (FISH OIL) 1200 MG CPDR Take 2 capsules by mouth daily.     omeprazole (PRILOSEC) 20 MG capsule TAKE 1 CAPSULE BY MOUTH ONCE DAILY 90 capsule 3   ondansetron (ZOFRAN-ODT) 4 MG disintegrating tablet Take 1 tablet (4 mg total) by mouth every 4 (four) hours as needed for nausea or vomiting. 20 tablet 0   rosuvastatin (CRESTOR) 5 MG tablet TAKE 1 TABLET BY MOUTH ONCE DAILY . 90 tablet 3   valACYclovir (VALTREX)  1000 MG tablet TAKE 1 TABLET BY MOUTH THREE TIMES DAILY AS DIRECTED 30 tablet 0   No current facility-administered medications on file prior to visit.   Past Medical History:  Diagnosis Date   Colon polyps    hyperplastic   Diverticulosis    Esophageal stricture    GERD (gastroesophageal reflux disease)    History of bronchitis    Hyperlipidemia    Hypertension    Mitral valve prolapse    Osteoarthritis    Osteoporosis    Spinal stenosis    Allergies  Allergen Reactions   Other Other (See Comments)    Joint pain. Patient denies being allergic to anything else.simvastatin    Lisinopril Cough    Tolerates losartan   Oxycodone Nausea Only and Palpitations    Change in mental status, camps, anxiety, chills   Simvastatin Other (See Comments)    Myalgias    Social History   Socioeconomic History   Marital status: Married    Spouse name: Not on file   Number of children: 2   Years of education: Not on file   Highest education level: Master's degree (e.g., MA, MS, MEng, MEd, MSW, MBA)  Occupational History   Occupation: Retired  Tobacco Use   Smoking status: Former    Packs/day: 1.00    Years: 29.00    Total pack years: 29.00    Types: Cigarettes    Quit date: 05/09/1999    Years since quitting: 22.7   Smokeless tobacco: Never  Vaping Use   Vaping Use: Never used  Substance and Sexual Activity   Alcohol use: Yes    Alcohol/week: 0.0 standard drinks of alcohol    Comment: OCC- GLASS OF WINE   Drug use: No   Sexual activity: Not Currently  Other Topics Concern   Not on file  Social History Narrative   Married   [place] of 2 helps take care 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   Neg td neg ets   Social Determinants of Health   Financial Resource Strain: Low Risk  (12/13/2021)   Overall Financial Resource Strain (CARDIA)    Difficulty of Paying  Living Expenses: Not hard at all  Food Insecurity: No Food Insecurity (12/13/2021)   Hunger Vital Sign    Worried About Running Out of Food in the Last Year: Never true    Ran Out of Food in the Last Year: Never true  Transportation Needs: No Transportation Needs (12/13/2021)   PRAPARE - Hydrologist (Medical): No    Lack of Transportation (Non-Medical): No  Physical Activity: Insufficiently Active (12/13/2021)   Exercise Vital Sign    Days of Exercise per Week: 3 days    Minutes of Exercise per Session: 30 min  Stress: No Stress Concern Present (12/13/2021)   [employer]  of Occupational Health - Occupational Stress Questionnaire    Feeling of Stress : Only a little  Social Connections: Socially Integrated (09/17/2021)   Social Connection and Isolation Panel [NHANES]    Frequency of Communication with Friends and Family: Twice a week    Frequency of Social Gatherings with Friends and Family: Twice a week    Attends Religious Services: More than 4 times per year    Active Member of [employer] or Organizations: Yes    Attends Archivist Meetings: More than 4 times per year    Marital Status: Married   Vitals:   02/06/22 1115  BP: 118/62  Pulse: 97  Resp: 16  Temp: 98.5 F (36.9 C)  SpO2: 97%  Body mass index is 26.31 kg/m. Physical Exam Vitals and nursing note reviewed.  Constitutional:      General: She is not in acute distress.    Appearance: She is well-developed.  HENT:     Head: Atraumatic.  Eyes:     Conjunctiva/sclera: Conjunctivae normal.  Cardiovascular:     Rate and Rhythm: Normal rate and regular rhythm.  Pulmonary:     Effort: Pulmonary effort is normal. No respiratory distress.     Breath sounds: Normal breath sounds.  Abdominal:     Palpations: Abdomen is soft. There is no mass.     Tenderness: There is abdominal tenderness in the suprapubic area. There is no right CVA tenderness, left CVA tenderness, guarding or rebound.   Skin:    General: Skin is warm.     Findings: No erythema.  Neurological:     Mental Status: She is alert and oriented to person, place, and time.  Psychiatric:        Mood and Affect: Mood is anxious.   ASSESSMENT AND PLAN:  [name] was seen today for urinary tract infection.  Diagnoses and all orders for this visit: Orders Placed This Encounter  Procedures   Urine Culture   POCT urinalysis dipstick   Dysuria Improved with abx treatment. Possible UTI was adequately treated. We discussed differential Dx, including non infectious process. We discussed options at this time and side effects of abx. Will treat again for possible UTI with Bactrim and will sent urine for cx again. OTC probiotic recommended. Instructed about warning signs. Will recommend urologist evaluation if symptoms are not resolved.  -     sulfamethoxazole-trimethoprim (BACTRIM DS) 800-160 MG tablet; Take 1 tablet by mouth 2 (two) times daily for 3 days.  URINARY URGENCY This is a chronic problem. We discussed treatment options, she is not interested in taking more medications.  Suprapubic abdominal pain We discussed possible etiologies, including urologic,gyn,and GI. Did not improve with appropriate UTI treatment. 07/2021 abdominal CT done because LLQ abdominal pain did not show bladder abnormalities, positive for diverticulosis of descending colon and sigmoid.  Instructed about warning signs.  Return if symptoms worsen or fail to improve.  Floyd Wade G. [place], MD  [facility]. [place] office.

## 2022-02-07 LAB — URINE CULTURE
MICRO NUMBER:: 13994576
SPECIMEN QUALITY:: ADEQUATE

## 2022-02-27 DIAGNOSIS — S39012A Strain of muscle, fascia and tendon of lower back, initial encounter: Secondary | ICD-10-CM | POA: Diagnosis not present

## 2022-02-27 DIAGNOSIS — S138XXA Sprain of joints and ligaments of other parts of neck, initial encounter: Secondary | ICD-10-CM | POA: Diagnosis not present

## 2022-02-27 DIAGNOSIS — M9905 Segmental and somatic dysfunction of pelvic region: Secondary | ICD-10-CM | POA: Diagnosis not present

## 2022-02-27 DIAGNOSIS — M5134 Other intervertebral disc degeneration, thoracic region: Secondary | ICD-10-CM | POA: Diagnosis not present

## 2022-02-27 DIAGNOSIS — S338XXA Sprain of other parts of lumbar spine and pelvis, initial encounter: Secondary | ICD-10-CM | POA: Diagnosis not present

## 2022-02-27 DIAGNOSIS — M9902 Segmental and somatic dysfunction of thoracic region: Secondary | ICD-10-CM | POA: Diagnosis not present

## 2022-02-27 DIAGNOSIS — M9903 Segmental and somatic dysfunction of lumbar region: Secondary | ICD-10-CM | POA: Diagnosis not present

## 2022-02-27 DIAGNOSIS — M9901 Segmental and somatic dysfunction of cervical region: Secondary | ICD-10-CM | POA: Diagnosis not present

## 2022-03-07 DIAGNOSIS — S338XXA Sprain of other parts of lumbar spine and pelvis, initial encounter: Secondary | ICD-10-CM | POA: Diagnosis not present

## 2022-03-07 DIAGNOSIS — S39012A Strain of muscle, fascia and tendon of lower back, initial encounter: Secondary | ICD-10-CM | POA: Diagnosis not present

## 2022-03-07 DIAGNOSIS — M9902 Segmental and somatic dysfunction of thoracic region: Secondary | ICD-10-CM | POA: Diagnosis not present

## 2022-03-07 DIAGNOSIS — M5134 Other intervertebral disc degeneration, thoracic region: Secondary | ICD-10-CM | POA: Diagnosis not present

## 2022-03-07 DIAGNOSIS — M9905 Segmental and somatic dysfunction of pelvic region: Secondary | ICD-10-CM | POA: Diagnosis not present

## 2022-03-07 DIAGNOSIS — M9903 Segmental and somatic dysfunction of lumbar region: Secondary | ICD-10-CM | POA: Diagnosis not present

## 2022-03-07 DIAGNOSIS — M9901 Segmental and somatic dysfunction of cervical region: Secondary | ICD-10-CM | POA: Diagnosis not present

## 2022-03-07 DIAGNOSIS — S138XXA Sprain of joints and ligaments of other parts of neck, initial encounter: Secondary | ICD-10-CM | POA: Diagnosis not present

## 2022-03-08 ENCOUNTER — Other Ambulatory Visit: Payer: Self-pay | Admitting: Internal Medicine

## 2022-03-12 ENCOUNTER — Other Ambulatory Visit: Payer: Self-pay | Admitting: Internal Medicine

## 2022-03-14 DIAGNOSIS — M9902 Segmental and somatic dysfunction of thoracic region: Secondary | ICD-10-CM | POA: Diagnosis not present

## 2022-03-14 DIAGNOSIS — S338XXA Sprain of other parts of lumbar spine and pelvis, initial encounter: Secondary | ICD-10-CM | POA: Diagnosis not present

## 2022-03-14 DIAGNOSIS — S138XXA Sprain of joints and ligaments of other parts of neck, initial encounter: Secondary | ICD-10-CM | POA: Diagnosis not present

## 2022-03-14 DIAGNOSIS — M5134 Other intervertebral disc degeneration, thoracic region: Secondary | ICD-10-CM | POA: Diagnosis not present

## 2022-03-14 DIAGNOSIS — S39012A Strain of muscle, fascia and tendon of lower back, initial encounter: Secondary | ICD-10-CM | POA: Diagnosis not present

## 2022-03-14 DIAGNOSIS — M9903 Segmental and somatic dysfunction of lumbar region: Secondary | ICD-10-CM | POA: Diagnosis not present

## 2022-03-14 DIAGNOSIS — M9901 Segmental and somatic dysfunction of cervical region: Secondary | ICD-10-CM | POA: Diagnosis not present

## 2022-03-14 DIAGNOSIS — M9905 Segmental and somatic dysfunction of pelvic region: Secondary | ICD-10-CM | POA: Diagnosis not present

## 2022-03-27 ENCOUNTER — Encounter: Payer: Self-pay | Admitting: Internal Medicine

## 2022-03-27 NOTE — Telephone Encounter (Signed)
Make her appt for  4 pm today in person or any time open tomorrow

## 2022-03-27 NOTE — Telephone Encounter (Signed)
Spoke with patient message given from Dr. Regis Bill.   Attempted to scheduled appt first avail was 04/04/22.   Advised patient to call office directly to speak with scheduling to assist with appointment.    Voiced understanding.

## 2022-03-28 ENCOUNTER — Ambulatory Visit (INDEPENDENT_AMBULATORY_CARE_PROVIDER_SITE_OTHER): Payer: Medicare HMO | Admitting: Internal Medicine

## 2022-03-28 ENCOUNTER — Encounter: Payer: Self-pay | Admitting: Internal Medicine

## 2022-03-28 VITALS — BP 136/50 | HR 93 | Temp 98.5°F | Wt 150.0 lb

## 2022-03-28 DIAGNOSIS — S138XXA Sprain of joints and ligaments of other parts of neck, initial encounter: Secondary | ICD-10-CM | POA: Diagnosis not present

## 2022-03-28 DIAGNOSIS — Z87440 Personal history of urinary (tract) infections: Secondary | ICD-10-CM

## 2022-03-28 DIAGNOSIS — M9901 Segmental and somatic dysfunction of cervical region: Secondary | ICD-10-CM | POA: Diagnosis not present

## 2022-03-28 DIAGNOSIS — M9902 Segmental and somatic dysfunction of thoracic region: Secondary | ICD-10-CM | POA: Diagnosis not present

## 2022-03-28 DIAGNOSIS — M9905 Segmental and somatic dysfunction of pelvic region: Secondary | ICD-10-CM | POA: Diagnosis not present

## 2022-03-28 DIAGNOSIS — N319 Neuromuscular dysfunction of bladder, unspecified: Secondary | ICD-10-CM

## 2022-03-28 DIAGNOSIS — S39012A Strain of muscle, fascia and tendon of lower back, initial encounter: Secondary | ICD-10-CM | POA: Diagnosis not present

## 2022-03-28 DIAGNOSIS — S338XXA Sprain of other parts of lumbar spine and pelvis, initial encounter: Secondary | ICD-10-CM | POA: Diagnosis not present

## 2022-03-28 DIAGNOSIS — R3915 Urgency of urination: Secondary | ICD-10-CM | POA: Diagnosis not present

## 2022-03-28 DIAGNOSIS — M5134 Other intervertebral disc degeneration, thoracic region: Secondary | ICD-10-CM | POA: Diagnosis not present

## 2022-03-28 DIAGNOSIS — M9903 Segmental and somatic dysfunction of lumbar region: Secondary | ICD-10-CM | POA: Diagnosis not present

## 2022-03-28 LAB — POC URINALSYSI DIPSTICK (AUTOMATED)
Bilirubin, UA: NEGATIVE
Blood, UA: NEGATIVE
Glucose, UA: NEGATIVE
Ketones, UA: NEGATIVE
Leukocytes, UA: NEGATIVE
Nitrite, UA: NEGATIVE
Protein, UA: NEGATIVE
Spec Grav, UA: 1.025 (ref 1.010–1.025)
Urobilinogen, UA: 0.2 E.U./dL
pH, UA: 5.5 (ref 5.0–8.0)

## 2022-03-28 MED ORDER — SULFAMETHOXAZOLE-TRIMETHOPRIM 800-160 MG PO TABS: 1.0000 | ORAL_TABLET | Freq: Two times a day (BID) | ORAL | 0 refills | Status: DC

## 2022-03-28 NOTE — Progress Notes (Signed)
Chief Complaint  Patient presents with   Back Pain    Pr reports she had an uti in September. Got treated. Sx came back 2 wks ago. Feeling urgency 5-6 times every night to urinate. Also having lower abdominal pain. Took AZO. Seem to help with sx of feeling urgency to urinate.     HPI: Jacqueline Myers 71 y.o. come in for urinary proble p oss uti   see message from yesterday.  Urgency  bad so hard to sleep  and   frequent an dlbp  difffernt that othe back pain No fever no burnign  Had UTI in September E. coli resistant to amoxicillin Augmentin was given Macrobid but did not improve until switched to Septra x3 days and then it resolved However she always tends to have some urinary difficulty frequency but not like this.  May be does not empty her bladder has some occasional incontinence stress and urgency. No fever when not having an acute attack. Has low back pain different from her mechanical pain but not severe.  No associated fever but makes her not feel well  No recnet  ROS: See pertinent positives and negatives per HPI. Hx of bouts of diarreha  no reaons   Past Medical History:  Diagnosis Date   Colon polyps    hyperplastic   Diverticulosis    Esophageal stricture    GERD (gastroesophageal reflux disease)    History of bronchitis    Hyperlipidemia    Hypertension    Mitral valve prolapse    Osteoarthritis    Osteoporosis    Spinal stenosis     Family History  Problem Relation Age of Onset   Breast cancer Sister    Alcohol abuse Mother    Stroke Mother    Hypertension Mother    Diabetes Mother    Hyperlipidemia Mother    Hyperlipidemia Father    Colon cancer Neg Hx     Social History   Socioeconomic History   Marital status: Married    Spouse name: Not on file   Number of children: 2   Years of education: Not on file   Highest education level: Master's degree (e.g., MA, MS, MEng, MEd, MSW, MBA)  Occupational History   Occupation: Retired  Tobacco  Use   Smoking status: Former    Packs/day: 1.00    Years: 29.00    Total pack years: 29.00    Types: Cigarettes    Quit date: 05/09/1999    Years since quitting: 22.9   Smokeless tobacco: Never  Vaping Use   Vaping Use: Never used  Substance and Sexual Activity   Alcohol use: Yes    Alcohol/week: 0.0 standard drinks of alcohol    Comment: OCC- GLASS OF WINE   Drug use: No   Sexual activity: Not Currently  Other Topics Concern   Not on file  Social History Narrative   Married   [place] of 2 helps take care 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   Neg td neg ets   Social Determinants of Health   Financial Resource Strain: Low Risk  (12/13/2021)   Overall Financial Resource Strain (CARDIA)    Difficulty of Paying Living Expenses: Not hard at all  Food Insecurity: No Food Insecurity (12/13/2021)   Hunger Vital Sign    Worried About Running Out of Food in  the Last Year: Never true    [place] in the Last Year: Never true  Transportation Needs: No Transportation Needs (12/13/2021)   PRAPARE - Hydrologist (Medical): No    Lack of Transportation (Non-Medical): No  Physical Activity: Insufficiently Active (12/13/2021)   Exercise Vital Sign    Days of Exercise per Week: 3 days    Minutes of Exercise per Session: 30 min  Stress: No Stress Concern Present (12/13/2021)   [place]    Feeling of Stress : Only a little  Social Connections: Socially Integrated (09/17/2021)   Social Connection and Isolation Panel [NHANES]    Frequency of Communication with Friends and Family: Twice a week    Frequency of Social Gatherings with Friends and Family: Twice a week    Attends Religious Services: More than 4 times per year    Active Member of [employer] or Organizations: Yes    Attends  Music therapist: More than 4 times per year    Marital Status: Married    Outpatient Medications Prior to Visit  Medication Sig Dispense Refill   acetaminophen (TYLENOL) 500 MG tablet Take 500-1,000 mg by mouth every 6 (six) hours as needed for moderate pain.     Candesartan Cilexetil-HCTZ 32-25 MG TABS Take 1 tablet by mouth once daily 90 tablet 0   cholecalciferol (VITAMIN D3) 25 MCG (1000 UNIT) tablet Take 2,000-4,000 Units by mouth.     cyclobenzaprine (FLEXERIL) 10 MG tablet TAKE 1/2 TO 1 (ONE-HALF TO ONE) TABLET BY MOUTH THREE TIMES DAILY AS NEEDED FOR MUSCLE SPASM 30 tablet 0   diclofenac sodium (VOLTAREN) 1 % GEL Apply 4 g topically 4 (four) times daily. 100 g 2   levothyroxine (SYNTHROID) 50 MCG tablet Take 1/2 (one-half) tablet by mouth once daily 45 tablet 0   Omega-3 Fatty Acids (FISH OIL) 1200 MG CPDR Take 2 capsules by mouth daily.     omeprazole (PRILOSEC) 20 MG capsule TAKE 1 CAPSULE BY MOUTH ONCE DAILY 90 capsule 3   ondansetron (ZOFRAN-ODT) 4 MG disintegrating tablet Take 1 tablet (4 mg total) by mouth every 4 (four) hours as needed for nausea or vomiting. 20 tablet 0   rosuvastatin (CRESTOR) 5 MG tablet Take 1 tablet by mouth once daily 90 tablet 0   valACYclovir (VALTREX) 1000 MG tablet TAKE 1 TABLET BY MOUTH THREE TIMES DAILY AS DIRECTED 30 tablet 0   No facility-administered medications prior to visit.     EXAM:  BP (!) 136/50 (BP Location: Left Arm, Cuff Size: Normal)   Pulse 93   Temp 98.5 F (36.9 C) (Oral)   Wt 150 lb (68 kg)   SpO2 94%   BMI 26.57 kg/m   Body mass index is 26.57 kg/m.  GENERAL: vitals reviewed and listed above, alert, oriented, appears well hydrated and in no acute distress HEENT: atraumatic, conjunctiva  clear, no obvious abnormalities on inspection of external nose and ears  NECK: no obvious masses on inspection palpation   CV: HRRR, no clubbing cyanosis or  peripheral edema nl cap refill  Abdomen soft without again  a megaly guarding or rebound but is tender over the suprapubic area negative flank pain. MS: moves all extremities without noticeable focal  abnormality PSYCH: pleasant and cooperative, no obvious depression or anxiety Lab Results  Component Value Date   WBC 7.8 08/30/2021   HGB 13.1 08/30/2021   HCT 39.5 08/30/2021  PLT 339.0 08/30/2021   GLUCOSE 92 09/21/2021   CHOL 161 09/21/2021   TRIG 127.0 09/21/2021   HDL 57.50 09/21/2021   LDLDIRECT 127.6 06/23/2013   LDLCALC 78 09/21/2021   ALT 18 07/17/2021   AST 22 07/17/2021   NA 137 09/21/2021   K 4.3 09/21/2021   CL 100 09/21/2021   CREATININE 0.88 09/21/2021   BUN 26 (H) 09/21/2021   CO2 28 09/21/2021   TSH 2.08 09/21/2021   INR 0.93 04/08/2015   HGBA1C 6.1 09/21/2021   BP Readings from Last 3 Encounters:  03/28/22 (!) 136/50  02/06/22 118/62  01/30/22 122/60    ASSESSMENT AND PLAN:  Discussed the following assessment and plan:  Urinary urgency - Plan: POCT Urinalysis Dipstick (Automated), Culture, Urine, Culture, Urine  History of UTI  Bladder dysfunction Possible UTI although urinalysis is clear today suspect underlying bladder dysfunction perhaps incomplete emptying pelvic prolapse other causes. Empiric treatment coming into the holiday awaiting culture Urology referral for recurrent urinary difficulty and then infection. -Patient advised to return or notify health care team  if  new concerns arise.  Patient Instructions  Urine culture pending  We can add antibiotic as before for 3-5 days in interim . WIll do referral to urology a it appears  that  your bladder may not be emptying  efficiently and thus setting you up to get these infections.   Ok to use the Azo if needed There can be treatments that may be helpful.    Standley Brooking. Kentrel Clevenger M.D.

## 2022-03-28 NOTE — Patient Instructions (Addendum)
Urine culture pending  We can add antibiotic as before for 3-5 days in interim . WIll do referral to urology a it appears  that  your bladder may not be emptying  efficiently and thus setting you up to get these infections.   Ok to use the Azo if needed There can be treatments that may be helpful.

## 2022-03-29 LAB — URINE CULTURE
MICRO NUMBER:: 14218914
Result:: NO GROWTH
SPECIMEN QUALITY:: ADEQUATE

## 2022-04-01 NOTE — Progress Notes (Signed)
Culture shows no bacteria so not consistent with UTi       plan urology referral  as discussed

## 2022-04-03 ENCOUNTER — Ambulatory Visit: Payer: Medicare HMO | Admitting: Internal Medicine

## 2022-04-11 DIAGNOSIS — M9902 Segmental and somatic dysfunction of thoracic region: Secondary | ICD-10-CM | POA: Diagnosis not present

## 2022-04-11 DIAGNOSIS — M9901 Segmental and somatic dysfunction of cervical region: Secondary | ICD-10-CM | POA: Diagnosis not present

## 2022-04-11 DIAGNOSIS — S138XXA Sprain of joints and ligaments of other parts of neck, initial encounter: Secondary | ICD-10-CM | POA: Diagnosis not present

## 2022-04-11 DIAGNOSIS — M9903 Segmental and somatic dysfunction of lumbar region: Secondary | ICD-10-CM | POA: Diagnosis not present

## 2022-04-11 DIAGNOSIS — M9905 Segmental and somatic dysfunction of pelvic region: Secondary | ICD-10-CM | POA: Diagnosis not present

## 2022-04-11 DIAGNOSIS — S338XXA Sprain of other parts of lumbar spine and pelvis, initial encounter: Secondary | ICD-10-CM | POA: Diagnosis not present

## 2022-04-11 DIAGNOSIS — S39012A Strain of muscle, fascia and tendon of lower back, initial encounter: Secondary | ICD-10-CM | POA: Diagnosis not present

## 2022-04-11 DIAGNOSIS — M5134 Other intervertebral disc degeneration, thoracic region: Secondary | ICD-10-CM | POA: Diagnosis not present

## 2022-04-26 DIAGNOSIS — R350 Frequency of micturition: Secondary | ICD-10-CM | POA: Diagnosis not present

## 2022-04-26 DIAGNOSIS — R351 Nocturia: Secondary | ICD-10-CM | POA: Diagnosis not present

## 2022-04-26 DIAGNOSIS — N393 Stress incontinence (female) (male): Secondary | ICD-10-CM | POA: Diagnosis not present

## 2022-04-26 DIAGNOSIS — N952 Postmenopausal atrophic vaginitis: Secondary | ICD-10-CM | POA: Diagnosis not present

## 2022-04-28 ENCOUNTER — Other Ambulatory Visit: Payer: Self-pay | Admitting: Internal Medicine

## 2022-05-07 IMAGING — DX DG CHEST 2V
2 series · 2 of 2 positions shown · non-contrast
Comparison: X-ray chest 01/27/2020.

CLINICAL DATA: Chronic cough.

EXAM:
CHEST - 2 VIEW

[chest pa]
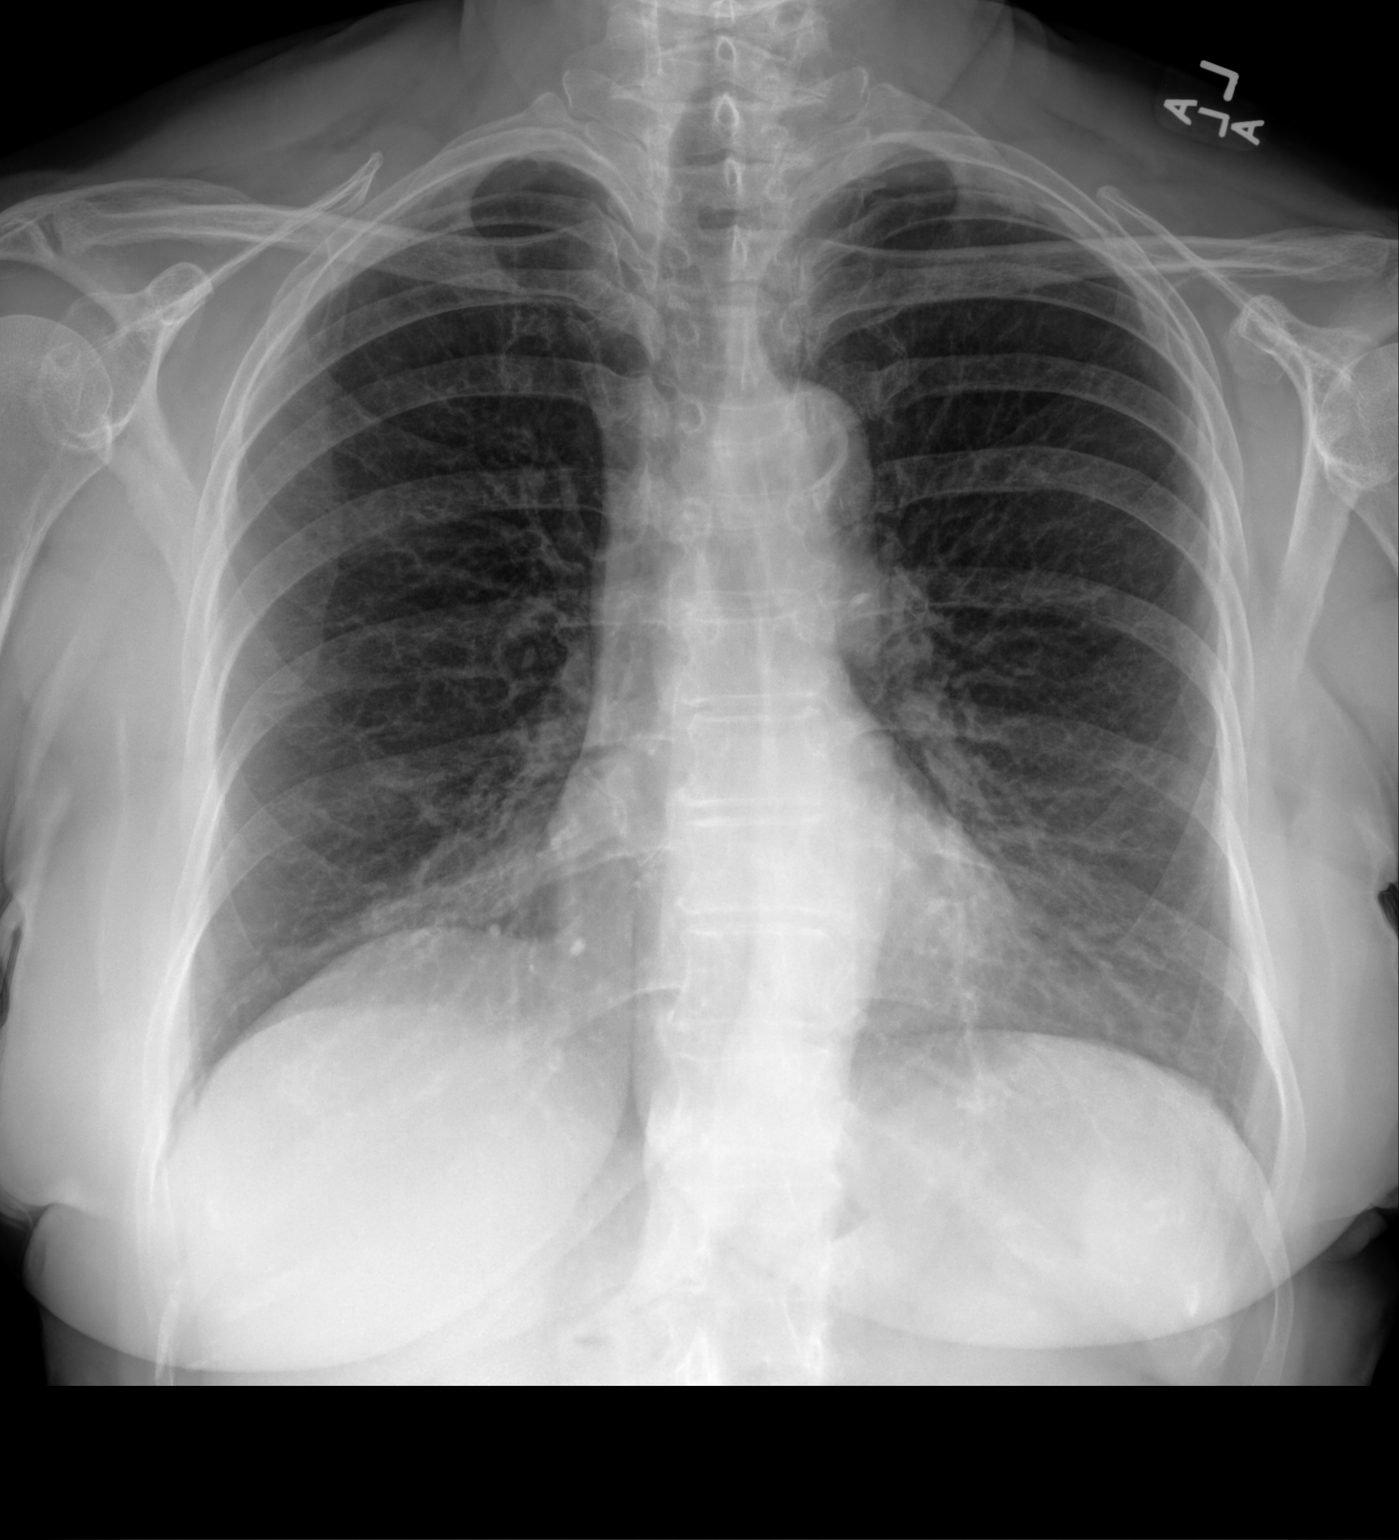

[chest lat]
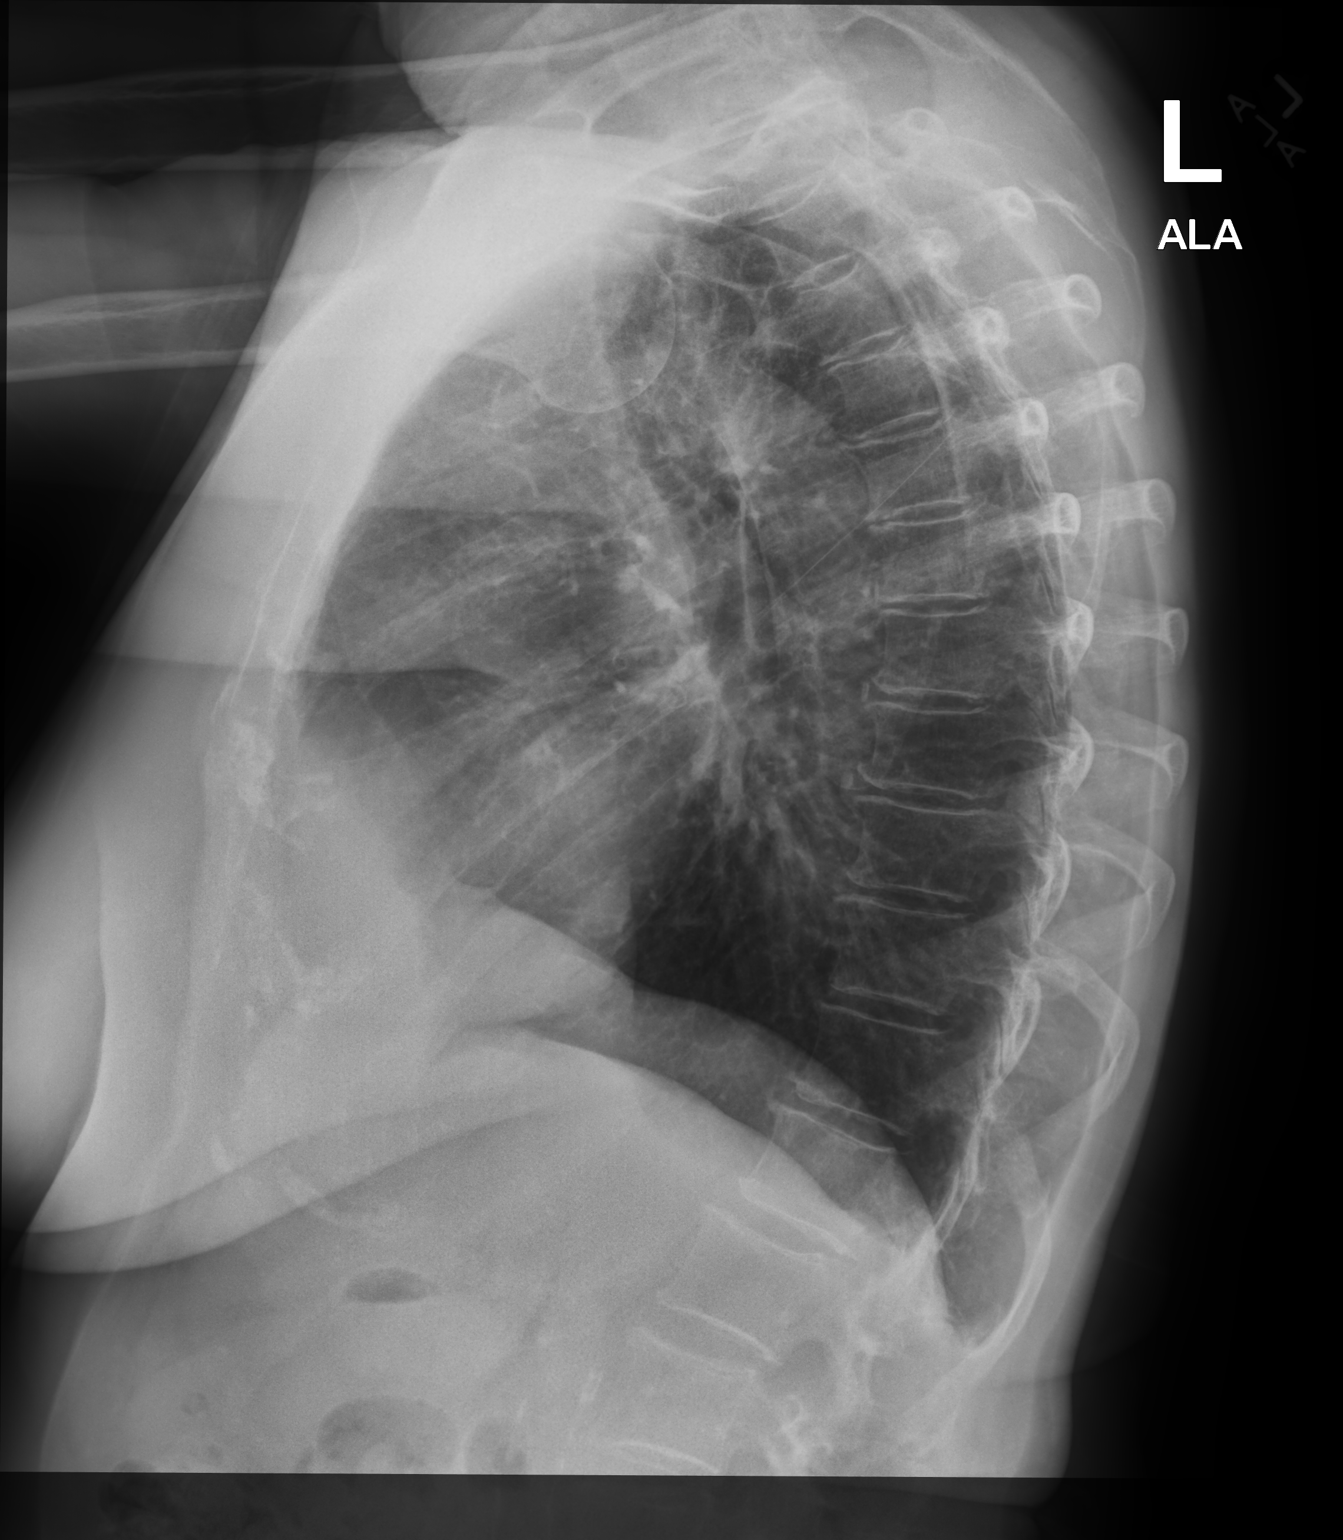

[2 of 2 positions shown; findings below may reference images not displayed]

FINDINGS: The heart size and mediastinal contours are within normal limits.
Emphysema. Patchy density medial right lung base on the frontal
view. No pleural effusion or pneumothorax. The visualized skeletal
structures are unremarkable.
IMPRESSION: Patchy atelectasis/consolidation at the right lung base. Suggest
follow-up examination given emphysema.

## 2022-05-25 NOTE — Progress Notes (Signed)
Cardiology Clinic Note   Patient Name: Jacqueline Myers Date of Encounter: 05/26/2022  Primary Care Provider:  Burnis Medin, MD Primary Cardiologist:  Skeet Latch, MD  Patient Profile    72 year old female with hx of Mitral regurgitation, HL, Hypertension. Last seen on 08/27/2020. Repeat stress test on 03/06/2019 revealed normal with no evidence of ischemia. Echocardiogram 03/10/2019 revealed that she had normal LVEF of 60% to 65%.  She did have some mild left ventricular hypertrophy and grade 1 diastolic dysfunction.  There were no valvular abnormalities.   Past Medical History    Past Medical History:  Diagnosis Date   Colon polyps    hyperplastic   Diverticulosis    Esophageal stricture    GERD (gastroesophageal reflux disease)    History of bronchitis    Hyperlipidemia    Hypertension    Mitral valve prolapse    Osteoarthritis    Osteoporosis    Spinal stenosis    Past Surgical History:  Procedure Laterality Date   COLONOSCOPY     NM MYOVIEW LTD  08/2006   normal for abn ekg   TONSILLECTOMY     TOTAL KNEE ARTHROPLASTY Left 04/14/2015   Procedure: LEFT TOTAL KNEE ARTHROPLASTY;  Surgeon: Gaynelle Arabian, MD;  Location: [facility];  Service: Orthopedics;  Laterality: Left;    Allergies  Allergies  Allergen Reactions   Other Other (See Comments)    Joint pain. Patient denies being allergic to anything else.simvastatin    Lisinopril Cough    Tolerates losartan   Oxycodone Nausea Only and Palpitations    Change in mental status, camps, anxiety, chills   Simvastatin Other (See Comments)    Myalgias    History of Present Illness    [name] is a very pleasant 72 year old lady with above-mentioned diagnoses and history who comes today for annual appointment.  She has some complaints of midepigastric fullness and squeezing which has occurred 3 times over the last few months usually when she is lying down.  Changing positions relieves this.  She states it  lasts approximately 1 minute and does not radiate or cause any associated symptoms.  She also has questions concerning atherosclerosis of the aorta which was found on a CT of her abdomen, and whether or not this is concerning.  She is active, goes to the [employer] about 3 times a week, riding the recumbent bike and using the weight machines, also trying out different yoga classes.  She is medically compliant, and labs are followed by her primary care physician.  Home Medications    Current Outpatient Medications  Medication Sig Dispense Refill   acetaminophen (TYLENOL) 500 MG tablet Take 500-1,000 mg by mouth every 6 (six) hours as needed for moderate pain.     Candesartan Cilexetil-HCTZ 32-25 MG TABS Take 1 tablet by mouth once daily 90 tablet 0   cholecalciferol (VITAMIN D3) 25 MCG (1000 UNIT) tablet Take 2,000-4,000 Units by mouth.     cyclobenzaprine (FLEXERIL) 10 MG tablet TAKE 1/2 TO 1 (ONE-HALF TO ONE) TABLET BY MOUTH THREE TIMES DAILY AS NEEDED FOR MUSCLE SPASM 30 tablet 0   diclofenac sodium (VOLTAREN) 1 % GEL Apply 4 g topically 4 (four) times daily. 100 g 2   estradiol (ESTRACE) 0.1 MG/GM vaginal cream Place 1 Applicatorful vaginally 3 (three) times a week.     levothyroxine (SYNTHROID) 50 MCG tablet Take 1/2 (one-half) tablet by mouth once daily 45 tablet 0   Omega-3 Fatty Acids (FISH OIL) 1200  MG CPDR Take 2 capsules by mouth daily.     omeprazole (PRILOSEC) 20 MG capsule Take 1 capsule by mouth once daily 90 capsule 0   ondansetron (ZOFRAN-ODT) 4 MG disintegrating tablet Take 1 tablet (4 mg total) by mouth every 4 (four) hours as needed for nausea or vomiting. 20 tablet 0   rosuvastatin (CRESTOR) 5 MG tablet Take 1 tablet by mouth once daily 90 tablet 0   valACYclovir (VALTREX) 1000 MG tablet TAKE 1 TABLET BY MOUTH THREE TIMES DAILY AS DIRECTED 30 tablet 0   sulfamethoxazole-trimethoprim (BACTRIM DS) 800-160 MG tablet Take 1 tablet by mouth 2 (two) times daily. For possible uti 10 tablet  0   No current facility-administered medications for this visit.     Family History    Family History  Problem Relation Age of Onset   Breast cancer Sister    Alcohol abuse Mother    Stroke Mother    Hypertension Mother    Diabetes Mother    Hyperlipidemia Mother    Hyperlipidemia Father    Colon cancer Neg Hx    She indicated that her mother is deceased. She indicated that her father is alive. She indicated that her sister is deceased. She indicated that her maternal grandmother is deceased. She indicated that her maternal grandfather is deceased. She indicated that her paternal grandmother is deceased. She indicated that her paternal grandfather is deceased. She indicated that the status of her neg hx is unknown.  Social History    Social History   Socioeconomic History   Marital status: Married    Spouse name: Not on file   Number of children: 2   Years of education: Not on file   Highest education level: Master's degree (e.g., MA, MS, MEng, MEd, MSW, MBA)  Occupational History   Occupation: Retired  Tobacco Use   Smoking status: Former    Packs/day: 1.00    Years: 29.00    Total pack years: 29.00    Types: Cigarettes    Quit date: 05/09/1999    Years since quitting: 23.0   Smokeless tobacco: Never  Vaping Use   Vaping Use: Never used  Substance and Sexual Activity   Alcohol use: Yes    Alcohol/week: 0.0 standard drinks of alcohol    Comment: OCC- GLASS OF WINE   Drug use: No   Sexual activity: Not Currently  Other Topics Concern   Not on file  Social History Narrative   Married   [place] of 2 helps take care 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   Neg td neg ets   Social Determinants of Health   Financial Resource Strain: Low Risk  (12/13/2021)   Overall Financial Resource Strain (CARDIA)    Difficulty of Paying Living Expenses: Not hard  at all  Food Insecurity: No Food Insecurity (12/13/2021)   Hunger Vital Sign    Worried About Running Out of Food in the Last Year: Never true    Ran Out of Food in the Last Year: Never true  Transportation Needs: No Transportation Needs (12/13/2021)   PRAPARE - Hydrologist (Medical): No    Lack of Transportation (Non-Medical): No  Physical Activity: Insufficiently Active (12/13/2021)   Exercise Vital Sign    Days of Exercise per Week: 3 days    Minutes of Exercise per Session:  30 min  Stress: No Stress Concern Present (12/13/2021)   [place]    Feeling of Stress : Only a little  Social Connections: Socially Integrated (09/17/2021)   Social Connection and Isolation Panel [NHANES]    Frequency of Communication with Friends and Family: Twice a week    Frequency of Social Gatherings with Friends and Family: Twice a week    Attends Religious Services: More than 4 times per year    Active Member of [employer] or Organizations: Yes    Attends Music therapist: More than 4 times per year    Marital Status: Married  Human resources officer Violence: Not At Risk (12/07/2020)   Humiliation, Afraid, Rape, and Kick questionnaire    Fear of Current or Ex-Partner: No    Emotionally Abused: No    Physically Abused: No    Sexually Abused: No     Review of Systems    General:  No chills, fever, night sweats or weight changes.  Cardiovascular:  No chest pain, dyspnea on exertion, edema, orthopnea, palpitations, paroxysmal nocturnal dyspnea. Dermatological: No rash, lesions/masses Respiratory: No cough, dyspnea Urologic: No hematuria, dysuria Abdominal:   No nausea, vomiting, diarrhea, bright red blood per rectum, melena, or hematemesis.  Complains of brief squeezing epigastric pain when lying flat, relieved with position changes. Neurologic:  No visual changes, wkns, changes in mental status. All other  systems reviewed and are otherwise negative except as noted above.     Physical Exam    VS:  BP 132/64 (BP Location: Left Arm, Patient Position: Sitting, Cuff Size: Normal)   Pulse 76   Ht '5\' 3"'$  (1.6 m)   Wt 151 lb (68.5 kg)   SpO2 96%   BMI 26.75 kg/m  , BMI Body mass index is 26.75 kg/m.     GEN: Well nourished, well developed, in no acute distress. HEENT: normal. Neck: Supple, no JVD, carotid bruits, or masses. Cardiac: RRR, no murmurs, rubs, or gallops. No clubbing, cyanosis, edema.  Radials/DP/PT 2+ and equal bilaterally.  Respiratory:  Respirations regular and unlabored, clear to auscultation bilaterally. GI: Soft, nontender, nondistended, BS + x 4. MS: no deformity or atrophy. Skin: warm and dry, no rash. Neuro:  Strength and sensation are intact. Psych: Normal affect.  Accessory Clinical Findings    ECG personally reviewed by me today-normal sinus rhythm heart rate of 76 bpm- No acute changes  Lab Results  Component Value Date   WBC 7.8 08/30/2021   HGB 13.1 08/30/2021   HCT 39.5 08/30/2021   MCV 87.7 08/30/2021   PLT 339.0 08/30/2021   Lab Results  Component Value Date   CREATININE 0.88 09/21/2021   BUN 26 (H) 09/21/2021   NA 137 09/21/2021   K 4.3 09/21/2021   CL 100 09/21/2021   CO2 28 09/21/2021   Lab Results  Component Value Date   ALT 18 07/17/2021   AST 22 07/17/2021   ALKPHOS 62 07/17/2021   BILITOT 0.5 07/17/2021   Lab Results  Component Value Date   CHOL 161 09/21/2021   HDL 57.50 09/21/2021   LDLCALC 78 09/21/2021   LDLDIRECT 127.6 06/23/2013   TRIG 127.0 09/21/2021   CHOLHDL 3 09/21/2021    Lab Results  Component Value Date   HGBA1C 6.1 09/21/2021    Review of Prior Studies: Echocardiogram 03/10/2019 1. Left ventricular ejection fraction, by visual estimation, is 60 to 65%. The left ventricle has normal function. There is moderately increased left  ventricular hypertrophy.  2. Left ventricular diastolic parameters are  consistent with Grade I diastolic dysfunction (impaired relaxation).  3. Global right ventricle has normal systolic function.The right ventricular size is normal. No increase in right ventricular wall thickness.  4. Left atrial size was normal.  5. Right atrial size was normal.  6. The mitral valve is grossly normal. Trace mitral valve regurgitation.  7. No diagnostic mitral valve prolapse.  8. The tricuspid valve is grossly normal. Tricuspid valve regurgitation is trivial.  9. The aortic valve is tricuspid. Aortic valve regurgitation is not visualized. 10. The pulmonic valve was grossly normal. Pulmonic valve regurgitation is not visualized. 11. The inferior vena cava is normal in size with greater than 50% respiratory variability, suggesting right atrial pressure of 3 mmHg.   Study Highlights     The left ventricular ejection fraction is hyperdynamic (>65%). Nuclear stress EF: 79%. There was no ST segment deviation noted during stress. The study is normal. This is a low risk study.   Normal stress nuclear study with no ischemia or infarction.  Gated ejection fraction 79% with normal wall motion.     Assessment & Plan   1.  Epigastric pain: I reviewed her CT of her abdomen and pelvis with her that was completed in March 2023.  She did have a small incidental diverticulum in the ascending portion of duodenum, along with a small hiatal hernia.  I think this may be the culprit for her epigastric pain.  I have advised her if the symptoms worsen that she is to follow-up with her GI physician for further evaluation and treatment options.  She will continue GERD medications as directed.  EKG is non concerning.  If she has no symptoms we can always consider a cardiac CTA with risk factors of hyperlipidemia, age, and hypertension.  Most recent cardiac stress test in 2020 was a low risk study.  2.  Hypertension: Blood pressure is well-controlled.  I encouraged her to continue her exercise regimen  at the [place] as this will help with blood pressure control.  No changes in her antihypertensive medication, she is to continue candesartan HCTZ as directed.  3.  Hypercholesterolemia: Followed by PCP and continues on rosuvastatin 5 mg daily.  I reviewed her most recent labs completed in May 2023 and she is well-controlled with a total cholesterol of 161, triglycerides 127, HDL 57, LDL 78.  4.  Aortic atherosclerosis: Unconcerning at this time.  She is asymptomatic and there is no dilatation or aneurysm seen of her aorta.  Reassurance is given.  Continue cardiovascular risk factor modification by keeping blood pressure controlled, cholesterol control, remaining active and low-cholesterol diet.  Current medicines are reviewed at length with the patient today.  I have spent 25 min's  dedicated to the care of this patient on the date of this encounter to include pre-visit review of records, assessment, management and diagnostic testing,with shared decision making.  Signed, Phill Myron. West Pugh, ANP, AACC   05/26/2022 8:48 AM      Office [phone] Fax [phone]  Notice: This dictation was prepared with Dragon dictation along with smaller phrase technology. Any transcriptional errors that result from this process are unintentional and may not be corrected upon review.

## 2022-05-26 ENCOUNTER — Ambulatory Visit: Payer: PPO | Attending: Adult Health | Admitting: Adult Health

## 2022-05-26 ENCOUNTER — Encounter: Payer: Self-pay | Admitting: Adult Health

## 2022-05-26 VITALS — BP 132/64 | HR 76 | Ht 63.0 in | Wt 151.0 lb

## 2022-05-26 DIAGNOSIS — I10 Essential (primary) hypertension: Secondary | ICD-10-CM

## 2022-05-26 NOTE — Patient Instructions (Signed)
Medication Instructions:  No Changes *If you need a refill on your cardiac medications before your next appointment, please call your pharmacy*   Lab Work: No Labs If you have labs (blood work) drawn today and your tests are completely normal, you will receive your results only by: [facility] (if you have MyChart) OR A paper copy in the mail If you have any lab test that is abnormal or we need to change your treatment, we will call you to review the results.   Testing/Procedures: No Testing   Follow-Up: At [facility], you and your health needs are our priority.  As part of our continuing mission to provide you with exceptional heart care, we have created designated Provider Care Teams.  These Care Teams include your primary Cardiologist (physician) and Advanced Practice Providers (APPs -  Physician Assistants and Nurse Practitioners) who all work together to provide you with the care you need, when you need it.  We recommend signing up for the patient portal called "MyChart".  Sign up information is provided on this After Visit Summary.  MyChart is used to connect with patients for Virtual Visits (Telemedicine).  Patients are able to view lab/test results, encounter notes, upcoming appointments, etc.  Non-urgent messages can be sent to your provider as well.   To learn more about what you can do with MyChart, go to [email or web address].    Your next appointment:   1 year(s)  Provider:   Skeet Latch, MD

## 2022-05-29 ENCOUNTER — Other Ambulatory Visit: Payer: Self-pay | Admitting: Internal Medicine

## 2022-06-05 ENCOUNTER — Other Ambulatory Visit: Payer: Self-pay | Admitting: Internal Medicine

## 2022-06-07 DIAGNOSIS — R278 Other lack of coordination: Secondary | ICD-10-CM | POA: Diagnosis not present

## 2022-06-07 DIAGNOSIS — R351 Nocturia: Secondary | ICD-10-CM | POA: Diagnosis not present

## 2022-06-09 ENCOUNTER — Other Ambulatory Visit: Payer: Self-pay | Admitting: Internal Medicine

## 2022-06-22 DIAGNOSIS — L814 Other melanin hyperpigmentation: Secondary | ICD-10-CM | POA: Diagnosis not present

## 2022-06-22 DIAGNOSIS — L820 Inflamed seborrheic keratosis: Secondary | ICD-10-CM | POA: Diagnosis not present

## 2022-06-22 DIAGNOSIS — D225 Melanocytic nevi of trunk: Secondary | ICD-10-CM | POA: Diagnosis not present

## 2022-06-22 DIAGNOSIS — L918 Other hypertrophic disorders of the skin: Secondary | ICD-10-CM | POA: Diagnosis not present

## 2022-06-22 DIAGNOSIS — L821 Other seborrheic keratosis: Secondary | ICD-10-CM | POA: Diagnosis not present

## 2022-06-27 DIAGNOSIS — S138XXA Sprain of joints and ligaments of other parts of neck, initial encounter: Secondary | ICD-10-CM | POA: Diagnosis not present

## 2022-06-27 DIAGNOSIS — M9901 Segmental and somatic dysfunction of cervical region: Secondary | ICD-10-CM | POA: Diagnosis not present

## 2022-06-27 DIAGNOSIS — M9902 Segmental and somatic dysfunction of thoracic region: Secondary | ICD-10-CM | POA: Diagnosis not present

## 2022-06-27 DIAGNOSIS — M9903 Segmental and somatic dysfunction of lumbar region: Secondary | ICD-10-CM | POA: Diagnosis not present

## 2022-06-27 DIAGNOSIS — M5134 Other intervertebral disc degeneration, thoracic region: Secondary | ICD-10-CM | POA: Diagnosis not present

## 2022-06-27 DIAGNOSIS — M9905 Segmental and somatic dysfunction of pelvic region: Secondary | ICD-10-CM | POA: Diagnosis not present

## 2022-06-27 DIAGNOSIS — S39012A Strain of muscle, fascia and tendon of lower back, initial encounter: Secondary | ICD-10-CM | POA: Diagnosis not present

## 2022-06-27 DIAGNOSIS — S338XXA Sprain of other parts of lumbar spine and pelvis, initial encounter: Secondary | ICD-10-CM | POA: Diagnosis not present

## 2022-06-28 ENCOUNTER — Encounter: Payer: Self-pay | Admitting: Internal Medicine

## 2022-06-29 NOTE — Telephone Encounter (Signed)
Not sure may need visit    if concerning  . ( I am out of office next week returning for march 5th usually  .

## 2022-07-05 DIAGNOSIS — M1711 Unilateral primary osteoarthritis, right knee: Secondary | ICD-10-CM | POA: Diagnosis not present

## 2022-07-22 ENCOUNTER — Other Ambulatory Visit: Payer: Self-pay | Admitting: Internal Medicine

## 2022-07-25 DIAGNOSIS — M9901 Segmental and somatic dysfunction of cervical region: Secondary | ICD-10-CM | POA: Diagnosis not present

## 2022-07-25 DIAGNOSIS — M9903 Segmental and somatic dysfunction of lumbar region: Secondary | ICD-10-CM | POA: Diagnosis not present

## 2022-07-25 DIAGNOSIS — M9902 Segmental and somatic dysfunction of thoracic region: Secondary | ICD-10-CM | POA: Diagnosis not present

## 2022-07-25 DIAGNOSIS — M5134 Other intervertebral disc degeneration, thoracic region: Secondary | ICD-10-CM | POA: Diagnosis not present

## 2022-07-25 DIAGNOSIS — S39012A Strain of muscle, fascia and tendon of lower back, initial encounter: Secondary | ICD-10-CM | POA: Diagnosis not present

## 2022-07-25 DIAGNOSIS — S338XXA Sprain of other parts of lumbar spine and pelvis, initial encounter: Secondary | ICD-10-CM | POA: Diagnosis not present

## 2022-07-25 DIAGNOSIS — S138XXA Sprain of joints and ligaments of other parts of neck, initial encounter: Secondary | ICD-10-CM | POA: Diagnosis not present

## 2022-07-25 DIAGNOSIS — M9905 Segmental and somatic dysfunction of pelvic region: Secondary | ICD-10-CM | POA: Diagnosis not present

## 2022-07-26 ENCOUNTER — Other Ambulatory Visit: Payer: Self-pay | Admitting: Internal Medicine

## 2022-08-01 DIAGNOSIS — M9902 Segmental and somatic dysfunction of thoracic region: Secondary | ICD-10-CM | POA: Diagnosis not present

## 2022-08-01 DIAGNOSIS — M9903 Segmental and somatic dysfunction of lumbar region: Secondary | ICD-10-CM | POA: Diagnosis not present

## 2022-08-01 DIAGNOSIS — S39012A Strain of muscle, fascia and tendon of lower back, initial encounter: Secondary | ICD-10-CM | POA: Diagnosis not present

## 2022-08-01 DIAGNOSIS — M5134 Other intervertebral disc degeneration, thoracic region: Secondary | ICD-10-CM | POA: Diagnosis not present

## 2022-08-01 DIAGNOSIS — S138XXA Sprain of joints and ligaments of other parts of neck, initial encounter: Secondary | ICD-10-CM | POA: Diagnosis not present

## 2022-08-01 DIAGNOSIS — S338XXA Sprain of other parts of lumbar spine and pelvis, initial encounter: Secondary | ICD-10-CM | POA: Diagnosis not present

## 2022-08-01 DIAGNOSIS — M9901 Segmental and somatic dysfunction of cervical region: Secondary | ICD-10-CM | POA: Diagnosis not present

## 2022-08-01 DIAGNOSIS — M9905 Segmental and somatic dysfunction of pelvic region: Secondary | ICD-10-CM | POA: Diagnosis not present

## 2022-08-12 DIAGNOSIS — J189 Pneumonia, unspecified organism: Secondary | ICD-10-CM | POA: Diagnosis not present

## 2022-08-12 DIAGNOSIS — R051 Acute cough: Secondary | ICD-10-CM | POA: Diagnosis not present

## 2022-08-12 DIAGNOSIS — Z20822 Contact with and (suspected) exposure to covid-19: Secondary | ICD-10-CM | POA: Diagnosis not present

## 2022-08-14 DIAGNOSIS — Z1231 Encounter for screening mammogram for malignant neoplasm of breast: Secondary | ICD-10-CM | POA: Diagnosis not present

## 2022-08-14 LAB — HM MAMMOGRAPHY

## 2022-08-17 DIAGNOSIS — J189 Pneumonia, unspecified organism: Secondary | ICD-10-CM | POA: Diagnosis not present

## 2022-08-17 DIAGNOSIS — Z20822 Contact with and (suspected) exposure to covid-19: Secondary | ICD-10-CM | POA: Diagnosis not present

## 2022-08-21 NOTE — Progress Notes (Unsigned)
No chief complaint on file.   HPI: Jacqueline Myers 72 y.o. come in for Fu  [facility]  dx with capd 4 6 LLL given doxy tessalon and albuterol then 4 11 z pack  Ct 12 22 mild emphysematous changes and cor alcium cuildup ROS: See pertinent positives and negatives per HPI.  Past Medical History:  Diagnosis Date   Colon polyps    hyperplastic   Diverticulosis    Esophageal stricture    GERD (gastroesophageal reflux disease)    History of bronchitis    Hyperlipidemia    Hypertension    Mitral valve prolapse    Osteoarthritis    Osteoporosis    Spinal stenosis     Family History  Problem Relation Age of Onset   Breast cancer Sister    Alcohol abuse Mother    Stroke Mother    Hypertension Mother    Diabetes Mother    Hyperlipidemia Mother    Hyperlipidemia Father    Colon cancer Neg Hx     Social History   Socioeconomic History   Marital status: Married    Spouse name: Not on file   Number of children: 2   Years of education: Not on file   Highest education level: Master's degree (e.g., MA, MS, MEng, MEd, MSW, MBA)  Occupational History   Occupation: Retired  Tobacco Use   Smoking status: Former    Packs/day: 1.00    Years: 29.00    Additional pack years: 0.00    Total pack years: 29.00    Types: Cigarettes    Quit date: 05/09/1999    Years since quitting: 23.3   Smokeless tobacco: Never  Vaping Use   Vaping Use: Never used  Substance and Sexual Activity   Alcohol use: Yes    Alcohol/week: 0.0 standard drinks of alcohol    Comment: OCC- GLASS OF WINE   Drug use: No   Sexual activity: Not Currently  Other Topics Concern   Not on file  Social History Narrative   Married   HH of 2 helps take care 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   Neg td neg ets   Social Determinants of Health   Financial Resource  Strain: Low Risk  (12/13/2021)   Overall Financial Resource Strain (CARDIA)    Difficulty of Paying Living Expenses: Not hard at all  Food Insecurity: No Food Insecurity (12/13/2021)   Hunger Vital Sign    Worried About Running Out of Food in the Last Year: Never true    Ran Out of Food in the Last Year: Never true  Transportation Needs: No Transportation Needs (12/13/2021)   PRAPARE - Administrator, Civil Service (Medical): No    Lack of Transportation (Non-Medical): No  Physical Activity: Insufficiently Active (12/13/2021)   Exercise Vital Sign    Days of Exercise per Week: 3 days    Minutes of Exercise per Session: 30 min  Stress: No Stress Concern Present (12/13/2021)   [employer] of Occupational Health - Occupational Stress Questionnaire    Feeling of Stress : Only a little  Social Connections: Socially Integrated (09/17/2021)   Social Connection and Isolation Panel [NHANES]    Frequency of Communication with Friends and Family: Twice a week    Frequency of Social Gatherings with Friends and Family: Twice a week  Attends Religious Services: More than 4 times per year    Active Member of Clubs or Organizations: Yes    Attends Banker Meetings: More than 4 times per year    Marital Status: Married    Outpatient Medications Prior to Visit  Medication Sig Dispense Refill   acetaminophen (TYLENOL) 500 MG tablet Take 500-1,000 mg by mouth every 6 (six) hours as needed for moderate pain.     Candesartan Cilexetil-HCTZ 32-25 MG TABS Take 1 tablet by mouth once daily 90 tablet 0   cholecalciferol (VITAMIN D3) 25 MCG (1000 UNIT) tablet Take 2,000-4,000 Units by mouth.     cyclobenzaprine (FLEXERIL) 10 MG tablet TAKE 1/2 TO 1 (ONE-HALF TO ONE) TABLET BY MOUTH THREE TIMES DAILY AS NEEDED FOR MUSCLE SPASM 30 tablet 0   diclofenac sodium (VOLTAREN) 1 % GEL Apply 4 g topically 4 (four) times daily. 100 g 2   estradiol (ESTRACE) 0.1 MG/GM vaginal cream Place 1  Applicatorful vaginally 3 (three) times a week.     levothyroxine (SYNTHROID) 50 MCG tablet Take 1/2 (one-half) tablet by mouth once daily 45 tablet 0   Omega-3 Fatty Acids (FISH OIL) 1200 MG CPDR Take 2 capsules by mouth daily.     omeprazole (PRILOSEC) 20 MG capsule Take 1 capsule by mouth once daily 90 capsule 0   ondansetron (ZOFRAN-ODT) 4 MG disintegrating tablet Take 1 tablet (4 mg total) by mouth every 4 (four) hours as needed for nausea or vomiting. 20 tablet 0   rosuvastatin (CRESTOR) 5 MG tablet Take 1 tablet by mouth once daily 90 tablet 0   sulfamethoxazole-trimethoprim (BACTRIM DS) 800-160 MG tablet Take 1 tablet by mouth 2 (two) times daily. For possible uti 10 tablet 0   valACYclovir (VALTREX) 1000 MG tablet TAKE 1 TABLET BY MOUTH THREE TIMES DAILY AS DIRECTED 30 tablet 2   No facility-administered medications prior to visit.     EXAM:  There were no vitals taken for this visit.  There is no height or weight on file to calculate BMI.  GENERAL: vitals reviewed and listed above, alert, oriented, appears well hydrated and in no acute distress HEENT: atraumatic, conjunctiva  clear, no obvious abnormalities on inspection of external nose and ears OP : no lesion edema or exudate  NECK: no obvious masses on inspection palpation  LUNGS: clear to auscultation bilaterally, no wheezes, rales or rhonchi, good air movement CV: HRRR, no clubbing cyanosis or  peripheral edema nl cap refill  MS: moves all extremities without noticeable focal  abnormality PSYCH: pleasant and cooperative, no obvious depression or anxiety Lab Results  Component Value Date   WBC 7.8 08/30/2021   HGB 13.1 08/30/2021   HCT 39.5 08/30/2021   PLT 339.0 08/30/2021   GLUCOSE 92 09/21/2021   CHOL 161 09/21/2021   TRIG 127.0 09/21/2021   HDL 57.50 09/21/2021   LDLDIRECT 127.6 06/23/2013   LDLCALC 78 09/21/2021   ALT 18 07/17/2021   AST 22 07/17/2021   NA 137 09/21/2021   K 4.3 09/21/2021   CL 100  09/21/2021   CREATININE 0.88 09/21/2021   BUN 26 (H) 09/21/2021   CO2 28 09/21/2021   TSH 2.08 09/21/2021   INR 0.93 04/08/2015   HGBA1C 6.1 09/21/2021   BP Readings from Last 3 Encounters:  05/26/22 132/64  03/28/22 (!) 136/50  02/06/22 118/62    ASSESSMENT AND PLAN:  Discussed the following assessment and plan:  No diagnosis found.  -Patient advised to return or notify health  care team  if  new concerns arise.  There are no Patient Instructions on file for this visit.   Neta Mends. Taheem Fricke M.D.

## 2022-08-22 ENCOUNTER — Encounter: Payer: Self-pay | Admitting: Internal Medicine

## 2022-08-22 ENCOUNTER — Ambulatory Visit (INDEPENDENT_AMBULATORY_CARE_PROVIDER_SITE_OTHER): Payer: HMO | Admitting: Internal Medicine

## 2022-08-22 VITALS — BP 110/56 | HR 87 | Temp 97.9°F | Ht 63.0 in | Wt 150.6 lb

## 2022-08-22 DIAGNOSIS — H9313 Tinnitus, bilateral: Secondary | ICD-10-CM

## 2022-08-22 DIAGNOSIS — J189 Pneumonia, unspecified organism: Secondary | ICD-10-CM

## 2022-08-22 NOTE — Patient Instructions (Signed)
Glad you are doing better .    Plan fu chest x ray in  3-4 weeks to ensure   stability .  When better    suggest full audilogy .  Evaluation to be sure no other  interventions would help.

## 2022-08-30 ENCOUNTER — Other Ambulatory Visit: Payer: Self-pay | Admitting: Family

## 2022-09-03 ENCOUNTER — Other Ambulatory Visit: Payer: Self-pay | Admitting: Family

## 2022-09-06 ENCOUNTER — Other Ambulatory Visit: Payer: Self-pay | Admitting: *Deleted

## 2022-09-06 DIAGNOSIS — M1711 Unilateral primary osteoarthritis, right knee: Secondary | ICD-10-CM | POA: Diagnosis not present

## 2022-09-06 MED ORDER — LEVOTHYROXINE SODIUM 50 MCG PO TABS: ORAL_TABLET | ORAL | 0 refills | Status: DC

## 2022-09-12 ENCOUNTER — Other Ambulatory Visit: Payer: HMO

## 2022-09-12 ENCOUNTER — Other Ambulatory Visit: Payer: Self-pay

## 2022-09-12 ENCOUNTER — Ambulatory Visit (INDEPENDENT_AMBULATORY_CARE_PROVIDER_SITE_OTHER): Payer: HMO

## 2022-09-12 DIAGNOSIS — M9901 Segmental and somatic dysfunction of cervical region: Secondary | ICD-10-CM | POA: Diagnosis not present

## 2022-09-12 DIAGNOSIS — S338XXA Sprain of other parts of lumbar spine and pelvis, initial encounter: Secondary | ICD-10-CM | POA: Diagnosis not present

## 2022-09-12 DIAGNOSIS — M9903 Segmental and somatic dysfunction of lumbar region: Secondary | ICD-10-CM | POA: Diagnosis not present

## 2022-09-12 DIAGNOSIS — S138XXA Sprain of joints and ligaments of other parts of neck, initial encounter: Secondary | ICD-10-CM | POA: Diagnosis not present

## 2022-09-12 DIAGNOSIS — M9905 Segmental and somatic dysfunction of pelvic region: Secondary | ICD-10-CM | POA: Diagnosis not present

## 2022-09-12 DIAGNOSIS — J189 Pneumonia, unspecified organism: Secondary | ICD-10-CM | POA: Diagnosis not present

## 2022-09-12 DIAGNOSIS — M5134 Other intervertebral disc degeneration, thoracic region: Secondary | ICD-10-CM | POA: Diagnosis not present

## 2022-09-12 DIAGNOSIS — S39012A Strain of muscle, fascia and tendon of lower back, initial encounter: Secondary | ICD-10-CM | POA: Diagnosis not present

## 2022-09-12 DIAGNOSIS — M9902 Segmental and somatic dysfunction of thoracic region: Secondary | ICD-10-CM | POA: Diagnosis not present

## 2022-09-13 DIAGNOSIS — M1711 Unilateral primary osteoarthritis, right knee: Secondary | ICD-10-CM | POA: Diagnosis not present

## 2022-09-18 DIAGNOSIS — M9905 Segmental and somatic dysfunction of pelvic region: Secondary | ICD-10-CM | POA: Diagnosis not present

## 2022-09-18 DIAGNOSIS — M5134 Other intervertebral disc degeneration, thoracic region: Secondary | ICD-10-CM | POA: Diagnosis not present

## 2022-09-18 DIAGNOSIS — S39012A Strain of muscle, fascia and tendon of lower back, initial encounter: Secondary | ICD-10-CM | POA: Diagnosis not present

## 2022-09-18 DIAGNOSIS — S138XXA Sprain of joints and ligaments of other parts of neck, initial encounter: Secondary | ICD-10-CM | POA: Diagnosis not present

## 2022-09-18 DIAGNOSIS — S338XXA Sprain of other parts of lumbar spine and pelvis, initial encounter: Secondary | ICD-10-CM | POA: Diagnosis not present

## 2022-09-18 DIAGNOSIS — M9901 Segmental and somatic dysfunction of cervical region: Secondary | ICD-10-CM | POA: Diagnosis not present

## 2022-09-18 DIAGNOSIS — M9903 Segmental and somatic dysfunction of lumbar region: Secondary | ICD-10-CM | POA: Diagnosis not present

## 2022-09-18 DIAGNOSIS — M9902 Segmental and somatic dysfunction of thoracic region: Secondary | ICD-10-CM | POA: Diagnosis not present

## 2022-09-18 NOTE — Progress Notes (Unsigned)
No chief complaint on file.   HPI: Patient  Jacqueline Myers  72 y.o. comes in today for Preventive Health Care visit  And med evaluation  Health Maintenance  Topic Date Due   COVID-19 Vaccine (6 - 2023-24 season) 04/15/2022   INFLUENZA VACCINE  12/07/2022   Medicare Annual Wellness (AWV)  12/14/2022   COLONOSCOPY (Pts 45-66yrs Insurance coverage will need to be confirmed)  01/17/2023   MAMMOGRAM  08/14/2023   DTaP/Tdap/Td (3 - Td or Tdap) 07/03/2024   Pneumonia Vaccine 54+ Years old  Completed   DEXA SCAN  Completed   Hepatitis C Screening  Completed   Zoster Vaccines- Shingrix  Completed   HPV VACCINES  Aged Out   Health Maintenance Review LIFESTYLE:  Exercise:   Tobacco/ETS: Alcohol:  Sugar beverages: Sleep: Drug use: no HH of  Work:    ROS:  GEN/ HEENT: No fever, significant weight changes sweats headaches vision problems hearing changes, CV/ PULM; No chest pain shortness of breath cough, syncope,edema  change in exercise tolerance. GI /GU: No adominal pain, vomiting, change in bowel habits. No blood in the stool. No significant GU symptoms. SKIN/HEME: ,no acute skin rashes suspicious lesions or bleeding. No lymphadenopathy, nodules, masses.  NEURO/ PSYCH:  No neurologic signs such as weakness numbness. No depression anxiety. IMM/ Allergy: No unusual infections.  Allergy .   REST of 12 system review negative except as per HPI   Past Medical History:  Diagnosis Date   Colon polyps    hyperplastic   Diverticulosis    Esophageal stricture    GERD (gastroesophageal reflux disease)    History of bronchitis    Hyperlipidemia    Hypertension    Mitral valve prolapse    Osteoarthritis    Osteoporosis    Spinal stenosis     Past Surgical History:  Procedure Laterality Date   COLONOSCOPY     NM MYOVIEW LTD  08/2006   normal for abn ekg   TONSILLECTOMY     TOTAL KNEE ARTHROPLASTY Left 04/14/2015   Procedure: LEFT TOTAL KNEE ARTHROPLASTY;  Surgeon:  Ollen Gross, MD;  Location: [facility];  Service: Orthopedics;  Laterality: Left;    Family History  Problem Relation Age of Onset   Breast cancer Sister    Alcohol abuse Mother    Stroke Mother    Hypertension Mother    Diabetes Mother    Hyperlipidemia Mother    Hyperlipidemia Father    Colon cancer Neg Hx     Social History   Socioeconomic History   Marital status: Married    Spouse name: Not on file   Number of children: 2   Years of education: Not on file   Highest education level: Master's degree (e.g., MA, MS, MEng, MEd, MSW, MBA)  Occupational History   Occupation: Retired  Tobacco Use   Smoking status: Former    Packs/day: 1.00    Years: 29.00    Additional pack years: 0.00    Total pack years: 29.00    Types: Cigarettes    Quit date: 05/09/1999    Years since quitting: 23.3   Smokeless tobacco: Never  Vaping Use   Vaping Use: Never used  Substance and Sexual Activity   Alcohol use: Yes    Alcohol/week: 0.0 standard drinks of alcohol    Comment: OCC- GLASS OF WINE   Drug use: No   Sexual activity: Not Currently  Other Topics Concern   Not on file  Social History Narrative  Married   HH of 2 helps take care 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   Neg td neg ets   Social Determinants of Health   Financial Resource Strain: Low Risk  (12/13/2021)   Overall Financial Resource Strain (CARDIA)    Difficulty of Paying Living Expenses: Not hard at all  Food Insecurity: No Food Insecurity (12/13/2021)   Hunger Vital Sign    Worried About Running Out of Food in the Last Year: Never true    Ran Out of Food in the Last Year: Never true  Transportation Needs: No Transportation Needs (12/13/2021)   PRAPARE - Administrator, Civil Service (Medical): No    Lack of Transportation (Non-Medical): No  Physical Activity: Insufficiently  Active (12/13/2021)   Exercise Vital Sign    Days of Exercise per Week: 3 days    Minutes of Exercise per Session: 30 min  Stress: No Stress Concern Present (12/13/2021)   [employer] of Occupational Health - Occupational Stress Questionnaire    Feeling of Stress : Only a little  Social Connections: Socially Integrated (09/17/2021)   Social Connection and Isolation Panel [NHANES]    Frequency of Communication with Friends and Family: Twice a week    Frequency of Social Gatherings with Friends and Family: Twice a week    Attends Religious Services: More than 4 times per year    Active Member of [employer] or Organizations: Yes    Attends Engineer, structural: More than 4 times per year    Marital Status: Married    Outpatient Medications Prior to Visit  Medication Sig Dispense Refill   acetaminophen (TYLENOL) 500 MG tablet Take 500-1,000 mg by mouth every 6 (six) hours as needed for moderate pain.     Candesartan Cilexetil-HCTZ 32-25 MG TABS Take 1 tablet by mouth once daily 90 tablet 0   cholecalciferol (VITAMIN D3) 25 MCG (1000 UNIT) tablet Take 2,000-4,000 Units by mouth.     cyclobenzaprine (FLEXERIL) 10 MG tablet TAKE 1/2 TO 1 (ONE-HALF TO ONE) TABLET BY MOUTH THREE TIMES DAILY AS NEEDED FOR MUSCLE SPASM 30 tablet 0   diclofenac sodium (VOLTAREN) 1 % GEL Apply 4 g topically 4 (four) times daily. 100 g 2   levothyroxine (SYNTHROID) 50 MCG tablet Take 1/2 (one-half) tablet by mouth once daily 45 tablet 0   Omega-3 Fatty Acids (FISH OIL) 1200 MG CPDR Take 2 capsules by mouth daily.     omeprazole (PRILOSEC) 20 MG capsule Take 1 capsule by mouth once daily 90 capsule 0   ondansetron (ZOFRAN-ODT) 4 MG disintegrating tablet Take 1 tablet (4 mg total) by mouth every 4 (four) hours as needed for nausea or vomiting. 20 tablet 0   rosuvastatin (CRESTOR) 5 MG tablet Take 1 tablet by mouth once daily 90 tablet 0   valACYclovir (VALTREX) 1000 MG tablet TAKE 1 TABLET BY MOUTH THREE TIMES  DAILY AS DIRECTED (Patient taking differently: as needed.) 30 tablet 2   No facility-administered medications prior to visit.     EXAM:  There were no vitals taken for this visit.  There is no height or weight on file to calculate BMI. Wt Readings from Last 3 Encounters:  08/22/22 150 lb 9.6 oz (68.3 kg)  05/26/22 151 lb (68.5 kg)  03/28/22 150 lb (68 kg)    Physical Exam: Vital signs reviewed  ZOX:WRUE is a well-developed well-nourished alert cooperative    who appearsr stated age in no acute distress.  HEENT: normocephalic atraumatic , Eyes: PERRL EOM's full, conjunctiva clear, Nares: paten,t no deformity discharge or tenderness., Ears: no deformity EAC's clear TMs with normal landmarks. Mouth: clear OP, no lesions, edema.  Moist mucous membranes. Dentition in adequate repair. NECK: supple without masses, thyromegaly or bruits. CHEST/PULM:  Clear to auscultation and percussion breath sounds equal no wheeze , rales or rhonchi. No chest wall deformities or tenderness. Breast: normal by inspection . No dimpling, discharge, masses, tenderness or discharge . CV: PMI is nondisplaced, S1 S2 no gallops, murmurs, rubs. Peripheral pulses are full without delay.No JVD .  ABDOMEN: Bowel sounds normal nontender  No guard or rebound, no hepato splenomegal no CVA tenderness.  No hernia. Extremtities:  No clubbing cyanosis or edema, no acute joint swelling or redness no focal atrophy NEURO:  Oriented x3, cranial nerves 3-12 appear to be intact, no obvious focal weakness,gait within normal limits no abnormal reflexes or asymmetrical SKIN: No acute rashes normal turgor, color, no bruising or petechiae. PSYCH: Oriented, good eye contact, no obvious depression anxiety, cognition and judgment appear normal. LN: no cervical axillary inguinal adenopathy  Lab Results  Component Value Date   WBC 7.8 08/30/2021   HGB 13.1 08/30/2021   HCT 39.5 08/30/2021   PLT 339.0 08/30/2021   GLUCOSE 92 09/21/2021    CHOL 161 09/21/2021   TRIG 127.0 09/21/2021   HDL 57.50 09/21/2021   LDLDIRECT 127.6 06/23/2013   LDLCALC 78 09/21/2021   ALT 18 07/17/2021   AST 22 07/17/2021   NA 137 09/21/2021   K 4.3 09/21/2021   CL 100 09/21/2021   CREATININE 0.88 09/21/2021   BUN 26 (H) 09/21/2021   CO2 28 09/21/2021   TSH 2.08 09/21/2021   INR 0.93 04/08/2015   HGBA1C 6.1 09/21/2021    BP Readings from Last 3 Encounters:  08/22/22 (!) 110/56  05/26/22 132/64  03/28/22 (!) 136/50    Labplanreviewed with patient   ASSESSMENT AND PLAN:  Discussed the following assessment and plan:    ICD-10-CM   1. Visit for preventive health examination  Z00.00     2. Essential hypertension  I10     3. Mixed hyperlipidemia  E78.2     4. Primary osteoarthritis involving multiple joints  M15.9     5. Medication management  Z79.899      No follow-ups on file.  Patient Care Team: Monae Topping, Neta Mends, MD as PCP - General Chilton Si, MD as PCP - Cardiology (Cardiology) Hilarie Fredrickson, MD (Gastroenterology) Ollen Gross, MD as Consulting Physician (Orthopedic Surgery) Jene Every, MD as Consulting Physician (Orthopedic Surgery) There are no Patient Instructions on file for this visit.  Neta Mends. Jovanka Westgate M.D.

## 2022-09-18 NOTE — Progress Notes (Signed)
X ray shows resolution of pneumonia( air space disease)

## 2022-09-19 ENCOUNTER — Encounter: Payer: Self-pay | Admitting: Internal Medicine

## 2022-09-19 ENCOUNTER — Other Ambulatory Visit: Payer: HMO

## 2022-09-19 ENCOUNTER — Ambulatory Visit (INDEPENDENT_AMBULATORY_CARE_PROVIDER_SITE_OTHER): Payer: HMO | Admitting: Internal Medicine

## 2022-09-19 VITALS — BP 130/58 | HR 72 | Temp 98.0°F | Ht 63.0 in | Wt 148.4 lb

## 2022-09-19 DIAGNOSIS — Z0000 Encounter for general adult medical examination without abnormal findings: Secondary | ICD-10-CM | POA: Diagnosis not present

## 2022-09-19 DIAGNOSIS — E782 Mixed hyperlipidemia: Secondary | ICD-10-CM

## 2022-09-19 DIAGNOSIS — M159 Polyosteoarthritis, unspecified: Secondary | ICD-10-CM

## 2022-09-19 DIAGNOSIS — R739 Hyperglycemia, unspecified: Secondary | ICD-10-CM

## 2022-09-19 DIAGNOSIS — I10 Essential (primary) hypertension: Secondary | ICD-10-CM

## 2022-09-19 DIAGNOSIS — Z79899 Other long term (current) drug therapy: Secondary | ICD-10-CM

## 2022-09-19 LAB — CBC WITH DIFFERENTIAL/PLATELET
Basophils Absolute: 0 10*3/uL (ref 0.0–0.1)
Basophils Relative: 0.4 % (ref 0.0–3.0)
Eosinophils Absolute: 0.2 10*3/uL (ref 0.0–0.7)
Eosinophils Relative: 2.7 % (ref 0.0–5.0)
HCT: 39.6 % (ref 36.0–46.0)
Hemoglobin: 13.5 g/dL (ref 12.0–15.0)
Lymphocytes Relative: 31.5 % (ref 12.0–46.0)
Lymphs Abs: 2.5 10*3/uL (ref 0.7–4.0)
MCHC: 34.1 g/dL (ref 30.0–36.0)
MCV: 88.5 fl (ref 78.0–100.0)
Monocytes Absolute: 0.7 10*3/uL (ref 0.1–1.0)
Monocytes Relative: 8.4 % (ref 3.0–12.0)
Neutro Abs: 4.5 10*3/uL (ref 1.4–7.7)
Neutrophils Relative %: 57 % (ref 43.0–77.0)
Platelets: 286 10*3/uL (ref 150.0–400.0)
RBC: 4.48 Mil/uL (ref 3.87–5.11)
RDW: 14.1 % (ref 11.5–15.5)
WBC: 7.9 10*3/uL (ref 4.0–10.5)

## 2022-09-19 LAB — COMPREHENSIVE METABOLIC PANEL
ALT: 20 U/L (ref 0–35)
AST: 18 U/L (ref 0–37)
Albumin: 4.4 g/dL (ref 3.5–5.2)
Alkaline Phosphatase: 75 U/L (ref 39–117)
BUN: 27 mg/dL — ABNORMAL HIGH (ref 6–23)
CO2: 27 mEq/L (ref 19–32)
Calcium: 9.8 mg/dL (ref 8.4–10.5)
Chloride: 100 mEq/L (ref 96–112)
Creatinine, Ser: 0.9 mg/dL (ref 0.40–1.20)
GFR: 63.73 mL/min (ref >60.00)
Glucose, Bld: 89 mg/dL (ref 70–99)
Potassium: 3.7 mEq/L (ref 3.5–5.1)
Sodium: 139 mEq/L (ref 135–145)
Total Bilirubin: 0.4 mg/dL (ref 0.2–1.2)
Total Protein: 7.3 g/dL (ref 6.0–8.3)

## 2022-09-19 LAB — LIPID PANEL
Cholesterol: 184 mg/dL (ref 0–200)
HDL: 55.8 mg/dL (ref >39.00)
LDL Cholesterol: 98 mg/dL (ref 0–99)
NonHDL: 128.23
Total CHOL/HDL Ratio: 3
Triglycerides: 153 mg/dL — ABNORMAL HIGH (ref 0.0–149.0)
VLDL: 30.6 mg/dL (ref 0.0–40.0)

## 2022-09-19 LAB — HEMOGLOBIN A1C: Hgb A1c MFr Bld: 6.2 % (ref 4.6–6.5)

## 2022-09-19 LAB — TSH: TSH: 2.43 u[IU]/mL (ref 0.35–5.50)

## 2022-09-19 MED ORDER — LEVOTHYROXINE SODIUM 50 MCG PO TABS: ORAL_TABLET | ORAL | 3 refills | Status: DC

## 2022-09-19 MED ORDER — CANDESARTAN CILEXETIL-HCTZ 32-25 MG PO TABS: 1.0000 | ORAL_TABLET | Freq: Every day | ORAL | 3 refills | Status: DC

## 2022-09-19 MED ORDER — CYCLOBENZAPRINE HCL 10 MG PO TABS: ORAL_TABLET | ORAL | 0 refills | Status: DC

## 2022-09-19 MED ORDER — ROSUVASTATIN CALCIUM 5 MG PO TABS: 5.0000 mg | ORAL_TABLET | Freq: Every day | ORAL | 3 refills | Status: DC

## 2022-09-19 MED ORDER — OMEPRAZOLE 20 MG PO CPDR: 20.0000 mg | DELAYED_RELEASE_CAPSULE | Freq: Every day | ORAL | 0 refills | Status: DC

## 2022-09-19 NOTE — Patient Instructions (Signed)
Good to see you today . X ray ok  if cough worse or  persistent or progressive then ask for reassessment . Lab today and go from there . Will share with medical team.

## 2022-09-20 DIAGNOSIS — M1711 Unilateral primary osteoarthritis, right knee: Secondary | ICD-10-CM | POA: Diagnosis not present

## 2022-09-20 NOTE — Progress Notes (Signed)
Kidney function the same no diabetes  A1c is stable  thyroid in range and no anemia

## 2022-10-05 DIAGNOSIS — M5134 Other intervertebral disc degeneration, thoracic region: Secondary | ICD-10-CM | POA: Diagnosis not present

## 2022-10-05 DIAGNOSIS — S338XXA Sprain of other parts of lumbar spine and pelvis, initial encounter: Secondary | ICD-10-CM | POA: Diagnosis not present

## 2022-10-05 DIAGNOSIS — M9903 Segmental and somatic dysfunction of lumbar region: Secondary | ICD-10-CM | POA: Diagnosis not present

## 2022-10-05 DIAGNOSIS — S39012A Strain of muscle, fascia and tendon of lower back, initial encounter: Secondary | ICD-10-CM | POA: Diagnosis not present

## 2022-10-05 DIAGNOSIS — M9905 Segmental and somatic dysfunction of pelvic region: Secondary | ICD-10-CM | POA: Diagnosis not present

## 2022-10-05 DIAGNOSIS — M9901 Segmental and somatic dysfunction of cervical region: Secondary | ICD-10-CM | POA: Diagnosis not present

## 2022-10-05 DIAGNOSIS — M9902 Segmental and somatic dysfunction of thoracic region: Secondary | ICD-10-CM | POA: Diagnosis not present

## 2022-10-05 DIAGNOSIS — S138XXA Sprain of joints and ligaments of other parts of neck, initial encounter: Secondary | ICD-10-CM | POA: Diagnosis not present

## 2022-10-19 DIAGNOSIS — M9901 Segmental and somatic dysfunction of cervical region: Secondary | ICD-10-CM | POA: Diagnosis not present

## 2022-10-19 DIAGNOSIS — S338XXA Sprain of other parts of lumbar spine and pelvis, initial encounter: Secondary | ICD-10-CM | POA: Diagnosis not present

## 2022-10-19 DIAGNOSIS — M9903 Segmental and somatic dysfunction of lumbar region: Secondary | ICD-10-CM | POA: Diagnosis not present

## 2022-10-19 DIAGNOSIS — M9902 Segmental and somatic dysfunction of thoracic region: Secondary | ICD-10-CM | POA: Diagnosis not present

## 2022-10-19 DIAGNOSIS — M5134 Other intervertebral disc degeneration, thoracic region: Secondary | ICD-10-CM | POA: Diagnosis not present

## 2022-10-19 DIAGNOSIS — M9905 Segmental and somatic dysfunction of pelvic region: Secondary | ICD-10-CM | POA: Diagnosis not present

## 2022-10-19 DIAGNOSIS — S39012A Strain of muscle, fascia and tendon of lower back, initial encounter: Secondary | ICD-10-CM | POA: Diagnosis not present

## 2022-10-19 DIAGNOSIS — S138XXA Sprain of joints and ligaments of other parts of neck, initial encounter: Secondary | ICD-10-CM | POA: Diagnosis not present

## 2022-11-08 ENCOUNTER — Telehealth: Payer: Self-pay | Admitting: Cardiovascular Disease

## 2022-11-08 ENCOUNTER — Ambulatory Visit: Payer: HMO | Attending: General Practice | Admitting: General Practice

## 2022-11-08 ENCOUNTER — Encounter: Payer: Self-pay | Admitting: General Practice

## 2022-11-08 VITALS — BP 124/72 | HR 68 | Ht 63.0 in | Wt 151.4 lb

## 2022-11-08 DIAGNOSIS — I700 Atherosclerosis of aorta: Secondary | ICD-10-CM

## 2022-11-08 DIAGNOSIS — R079 Chest pain, unspecified: Secondary | ICD-10-CM

## 2022-11-08 DIAGNOSIS — R0789 Other chest pain: Secondary | ICD-10-CM

## 2022-11-08 DIAGNOSIS — Z0000 Encounter for general adult medical examination without abnormal findings: Secondary | ICD-10-CM

## 2022-11-08 DIAGNOSIS — I10 Essential (primary) hypertension: Secondary | ICD-10-CM

## 2022-11-08 DIAGNOSIS — E782 Mixed hyperlipidemia: Secondary | ICD-10-CM | POA: Diagnosis not present

## 2022-11-08 MED ORDER — ROSUVASTATIN CALCIUM 10 MG PO TABS
10.00 mg | ORAL_TABLET | Freq: Every day | ORAL | 3 refills | Status: DC
Start: 2022-11-08 — End: 2022-11-13

## 2022-11-08 NOTE — Patient Instructions (Addendum)
Medication Instructions:  INCREASE ROSUVASTATIN 10MG  DAILY *If you need a refill on your cardiac medications before your next appointment, please call your pharmacy*  Lab Work: FASTING LIPID AND LFT IN 6-8 WEEKS If you have labs (blood work) drawn today and your tests are completely normal, you will receive your results only by:  MyChart Message (if you have MyChart) OR  A paper copy in the mail If you have any lab test that is abnormal or we need to change your treatment, we will call you to review the results.   Other Instructions OK FOR PROCEDURE-CALL TO GET EGD ADDED ONTO YOUR COLONOSCOPY ELEVATE HEAD-OF-BED TO HELP  Follow-Up: At [facility], you and your health needs are our priority.  As part of our continuing mission to provide you with exceptional heart care, we have created designated Provider Care Teams.  These Care Teams include your primary Cardiologist (physician) and Advanced Practice Providers (APPs -  Physician Assistants and Nurse Practitioners) who all work together to provide you with the care you need, when you need it.  Your next appointment:   12 month(s)  Provider:   Chilton Si, MD

## 2022-11-08 NOTE — Telephone Encounter (Signed)
Stat call transferred from call center,   Patient states she has had some severe chest pain over the past 1.5 months. About 5 episodes. Having them during the day and now during the night. Each episode gets worse. The one this morning lasted about 10-15 minutes. No other symptoms when these occur. Patient does not check blood pressure.   Edd Fabian, NP had opening today at 915am, patient scheduled.

## 2022-11-08 NOTE — Progress Notes (Signed)
Cardiology Clinic Note   Patient Name: Jacqueline Myers Date of Encounter: 11/08/2022  Primary Care Provider:  Madelin Headings, MD Primary Cardiologist:  Chilton Si, MD  Patient Profile    Jacqueline Myers 72 year old female presents to the clinic today for an evaluation of her chest discomfort.  She contacted nurse triage line today and was added to my schedule.  Past Medical History    Past Medical History:  Diagnosis Date   Colon polyps    hyperplastic   Diverticulosis    Esophageal stricture    GERD (gastroesophageal reflux disease)    History of bronchitis    Hyperlipidemia    Hypertension    Mitral valve prolapse    Osteoarthritis    Osteoporosis    Spinal stenosis    Past Surgical History:  Procedure Laterality Date   COLONOSCOPY     NM MYOVIEW LTD  08/2006   normal for abn ekg   TONSILLECTOMY     TOTAL KNEE ARTHROPLASTY Left 04/14/2015   Procedure: LEFT TOTAL KNEE ARTHROPLASTY;  Surgeon: Ollen Gross, MD;  Location: [facility];  Service: Orthopedics;  Laterality: Left;    Allergies  Allergies  Allergen Reactions   Other Other (See Comments)    Joint pain. Patient denies being allergic to anything else.simvastatin    Lisinopril Cough    Tolerates losartan   Oxycodone Nausea Only and Palpitations    Change in mental status, camps, anxiety, chills   Simvastatin Other (See Comments)    Myalgias    History of Present Illness    Jacqueline Myers has a PMH of hyperlipidemia, mitral valve regurgitation, and hypertension.  Her stress testing 03/06/2019 showed normal values with no evidence of ischemia.  Her echocardiogram 03/10/2019 showed normal LV EF with 60-65%, mild LVH and G1 DD.  She was not noted to have any valvular abnormalities.  She was seen in follow-up by Bailey Mech, DNP on 05/26/2022.  She reported mid epigastric fullness and squeezing that had occurred about 3 times over the previous few months.  She notices this usually  with laying down.  It was relieved with changing positions.  She reported that it would last for approximately 1 minute and not radiate.  She did not have any associated symptoms.  She was concerned about the atherosclerosis of her aorta that was found on CT of her abdomen.  She remained active going to the [facility] 3 times per week and riding her recumbent bike.  She was also doing yoga classes.  She was compliant with her medications.  Her lab work was followed by her PCP.  She contacted nurse triage line today and reported increased episodes of chest discomfort over the last 1 and half months.  She reported the episodes would last for minutes and dissipate.  She presents to the clinic today for evaluation and states she has had about 5 episodes of chest discomfort over the last 1 and half months.  She feels that they are getting more severe.  She notices improvement when she stands up.  She notices them both through the day and at night.  She endorses hiatal hernia.  She has an upcoming colonoscopy this year.  Her discomfort appears to be GI in nature.  I asked that she reach out to her gastroenterologist for possible EGD at the time of her colonoscopy.  I will increase her rosuvastatin to 10 mg daily and repeat fasting lipids and LFTs in 6 to 8 weeks.  She was reassured that her discomfort is not related to cardiac issues..  Today she denies  shortness of breath, lower extremity edema, fatigue, palpitations, melena, hematuria, hemoptysis, diaphoresis, weakness, presyncope, syncope, orthopnea, and PND.    Home Medications    Prior to Admission medications   Medication Sig Start Date End Date Taking? Authorizing Provider  acetaminophen (TYLENOL) 500 MG tablet Take 500-1,000 mg by mouth every 6 (six) hours as needed for moderate pain.    [provider]  Candesartan Cilexetil-HCTZ 32-25 MG TABS Take 1 tablet by mouth daily. 09/19/22   Panosh, Neta Mends, MD  cholecalciferol (VITAMIN D3) 25 MCG  (1000 UNIT) tablet Take 2,000-4,000 Units by mouth.    [provider]  cyclobenzaprine (FLEXERIL) 10 MG tablet TAKE 1/2 TO 1 (ONE-HALF TO ONE) TABLET BY MOUTH THREE TIMES DAILY AS NEEDED FOR MUSCLE SPASM 09/19/22   Panosh, Neta Mends, MD  diclofenac sodium (VOLTAREN) 1 % GEL Apply 4 g topically 4 (four) times daily. 09/13/16   Panosh, Neta Mends, MD  levothyroxine (SYNTHROID) 50 MCG tablet Take 1/2 (one-half) tablet by mouth once daily 09/19/22   Panosh, Neta Mends, MD  Omega-3 Fatty Acids (FISH OIL) 1200 MG CPDR Take 2 capsules by mouth daily.    [provider]  omeprazole (PRILOSEC) 20 MG capsule Take 1 capsule (20 mg total) by mouth daily. 09/19/22   Panosh, Neta Mends, MD  ondansetron (ZOFRAN-ODT) 4 MG disintegrating tablet Take 1 tablet (4 mg total) by mouth every 4 (four) hours as needed for nausea or vomiting. 07/17/21   Arby Barrette, MD  rosuvastatin (CRESTOR) 5 MG tablet Take 1 tablet (5 mg total) by mouth daily. 09/19/22   Panosh, Neta Mends, MD  valACYclovir (VALTREX) 1000 MG tablet TAKE 1 TABLET BY MOUTH THREE TIMES DAILY AS DIRECTED Patient taking differently: as needed. 05/30/22   Panosh, Neta Mends, MD    Family History    Family History  Problem Relation Age of Onset   Breast cancer Sister    Alcohol abuse Mother    Stroke Mother    Hypertension Mother    Diabetes Mother    Hyperlipidemia Mother    Hyperlipidemia Father    Colon cancer Neg Hx    She indicated that her mother is deceased. She indicated that her father is alive. She indicated that her sister is deceased. She indicated that her maternal grandmother is deceased. She indicated that her maternal grandfather is deceased. She indicated that her paternal grandmother is deceased. She indicated that her paternal grandfather is deceased. She indicated that the status of her neg hx is unknown.  Social History    Social History   Socioeconomic History   Marital status: Married    Spouse name: Not on file   Number of  children: 2   Years of education: Not on file   Highest education level: Master's degree (e.g., MA, MS, MEng, MEd, MSW, MBA)  Occupational History   Occupation: Retired  Tobacco Use   Smoking status: Former    Packs/day: 1.00    Years: 29.00    Additional pack years: 0.00    Total pack years: 29.00    Types: Cigarettes    Quit date: 05/09/1999    Years since quitting: 23.5   Smokeless tobacco: Never  Vaping Use   Vaping Use: Never used  Substance and Sexual Activity   Alcohol use: Yes    Alcohol/week: 0.0 standard drinks of alcohol    Comment: OCC- GLASS OF WINE  Drug use: No   Sexual activity: Not Currently  Other Topics Concern   Not on file  Social History Narrative   Married   HH of 2 helps take care 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   Neg td neg ets   Social Determinants of Health   Financial Resource Strain: Low Risk  (12/13/2021)   Overall Financial Resource Strain (CARDIA)    Difficulty of Paying Living Expenses: Not hard at all  Food Insecurity: No Food Insecurity (12/13/2021)   Hunger Vital Sign    Worried About Running Out of Food in the Last Year: Never true    Ran Out of Food in the Last Year: Never true  Transportation Needs: No Transportation Needs (12/13/2021)   PRAPARE - Administrator, Civil Service (Medical): No    Lack of Transportation (Non-Medical): No  Physical Activity: Sufficiently Active (09/19/2022)   Exercise Vital Sign    Days of Exercise per Week: 4 days    Minutes of Exercise per Session: 150+ min  Stress: No Stress Concern Present (09/19/2022)   [employer] of Occupational Health - Occupational Stress Questionnaire    Feeling of Stress : Not at all  Social Connections: Socially Integrated (09/17/2021)   Social Connection and Isolation Panel [NHANES]    Frequency of Communication with Friends and  Family: Twice a week    Frequency of Social Gatherings with Friends and Family: Twice a week    Attends Religious Services: More than 4 times per year    Active Member of [employer] or Organizations: Yes    Attends Engineer, structural: More than 4 times per year    Marital Status: Married  Catering manager Violence: Not At Risk (12/07/2020)   Humiliation, Afraid, Rape, and Kick questionnaire    Fear of Current or Ex-Partner: No    Emotionally Abused: No    Physically Abused: No    Sexually Abused: No     Review of Systems    General:  No chills, fever, night sweats or weight changes.  Cardiovascular:  No chest pain, dyspnea on exertion, edema, orthopnea, palpitations, paroxysmal nocturnal dyspnea. Dermatological: No rash, lesions/masses Respiratory: No cough, dyspnea Urologic: No hematuria, dysuria Abdominal:   No nausea, vomiting, diarrhea, bright red blood per rectum, melena, or hematemesis Neurologic:  No visual changes, wkns, changes in mental status. All other systems reviewed and are otherwise negative except as noted above.  Physical Exam    VS:  BP 124/72   Pulse 68   Ht 5\' 3"  (1.6 m)   Wt 151 lb 6.4 oz (68.7 kg)   SpO2 94%   BMI 26.82 kg/m  , BMI Body mass index is 26.82 kg/m. GEN: Well nourished, well developed, in no acute distress. HEENT: normal. Neck: Supple, no JVD, carotid bruits, or masses. Cardiac: RRR, no murmurs, rubs, or gallops. No clubbing, cyanosis, edema.  Radials/DP/PT 2+ and equal bilaterally.  Respiratory:  Respirations regular and unlabored, clear to auscultation bilaterally. GI: Soft, nontender, nondistended, BS + x 4. MS: no deformity or atrophy. Skin: warm and dry, no rash. Neuro:  Strength and sensation are intact. Psych: Normal affect.  Accessory Clinical Findings    Recent Labs: 09/19/2022: ALT 20; BUN 27; Creatinine, Ser 0.90; Hemoglobin 13.5; Platelets 286.0; Potassium 3.7; Sodium 139; TSH 2.43   Recent Lipid Panel  Component Value Date/Time   CHOL 184 09/19/2022 1254   TRIG 153.0 (H) 09/19/2022 1254   TRIG 145 04/06/2006 0808   HDL 55.80 09/19/2022 1254   CHOLHDL 3 09/19/2022 1254   VLDL 30.6 09/19/2022 1254   LDLCALC 98 09/19/2022 1254   LDLDIRECT 127.6 06/23/2013 0807         ECG personally reviewed by me today-normal sinus rhythm no ectopy 87 bpm- No acute changes  Echocardiogram 03/10/2019 1. Left ventricular ejection fraction, by visual estimation, is 60 to 65%. The left ventricle has normal function. There is moderately increased left ventricular hypertrophy.  2. Left ventricular diastolic parameters are consistent with Grade I diastolic dysfunction (impaired relaxation).  3. Global right ventricle has normal systolic function.The right ventricular size is normal. No increase in right ventricular wall thickness.  4. Left atrial size was normal.  5. Right atrial size was normal.  6. The mitral valve is grossly normal. Trace mitral valve regurgitation.  7. No diagnostic mitral valve prolapse.  8. The tricuspid valve is grossly normal. Tricuspid valve regurgitation is trivial.  9. The aortic valve is tricuspid. Aortic valve regurgitation is not visualized. 10. The pulmonic valve was grossly normal. Pulmonic valve regurgitation is not visualized. 11. The inferior vena cava is normal in size with greater than 50% respiratory variability, suggesting right atrial pressure of 3 mmHg.   Study Highlights     The left ventricular ejection fraction is hyperdynamic (>65%). Nuclear stress EF: 79%. There was no ST segment deviation noted during stress. The study is normal. This is a low risk study.   Normal stress nuclear study with no ischemia or infarction.  Gated ejection fraction 79% with normal wall motion.      Assessment & Plan   1.  Chest pain-reports episode this morning that lasted for about 10 minutes.  She has had 5 episodes over the last month and a half.  They appear to be  getting more frequent and severe they are nonradiating.  EKG assuring.  Cardiac stress test 2020 showed low risk and no ischemia.  Symptoms appear to be GI in nature. Continue omeprazole Follow-up with GI-May benefit from EGD at the time of her colonoscopy GERD diet instructions   Aortic atherosclerosis-incidental finding on abdominal CT. Continue current medical therapy Reassurance provided Increase physical activity as tolerated  Essential hypertension-BP today 124/72. Maintain blood pressure log  Hyperlipidemia-LDL 98 on 09/19/22 High-fiber diet Increase rosuvastatin to 10 mg daily Repeat fasting lipids and LFTs in 6 to 8 weeks    Disposition: Follow-up with Dr. Duke Salvia or APP in 9-12 months.   Thomasene Ripple. Angeleah Labrake NP-C     11/08/2022, 9:47 AM [facility] [address] Office [phone] Fax [phone]    I spent 14 minutes examining this patient, reviewing medications, and using patient centered shared decision making involving her cardiac care.  Prior to her visit I spent greater than 20 minutes reviewing her past medical history,  medications, and prior cardiac tests.

## 2022-11-08 NOTE — Telephone Encounter (Signed)
Pt c/o of Chest Pain: STAT if CP now or developed within 24 hours  1. Are you having CP right now? No; this morning at 5:00 am  2. Are you experiencing any other symptoms (ex. SOB, nausea, vomiting, sweating)? No   3. How long have you been experiencing CP? Month and a half  4. Is your CP continuous or coming and going? Continuous   5. Have you taken Nitroglycerin? No  ?

## 2022-11-10 NOTE — Telephone Encounter (Signed)
Seen 7/3

## 2022-11-13 DIAGNOSIS — Z0000 Encounter for general adult medical examination without abnormal findings: Secondary | ICD-10-CM

## 2022-11-13 MED ORDER — ROSUVASTATIN CALCIUM 10 MG PO TABS
5.0000 mg | ORAL_TABLET | Freq: Every day | ORAL | 3 refills | Status: DC
Start: 2022-11-13 — End: 2023-12-21

## 2022-11-13 MED ORDER — EZETIMIBE 10 MG PO TABS: 10.0000 mg | ORAL_TABLET | Freq: Every day | ORAL | 3 refills | Status: DC

## 2022-11-20 DIAGNOSIS — Z961 Presence of intraocular lens: Secondary | ICD-10-CM | POA: Diagnosis not present

## 2022-11-20 DIAGNOSIS — I10 Essential (primary) hypertension: Secondary | ICD-10-CM | POA: Diagnosis not present

## 2022-11-20 DIAGNOSIS — D2310 Other benign neoplasm of skin of unspecified eyelid, including canthus: Secondary | ICD-10-CM | POA: Diagnosis not present

## 2022-11-20 DIAGNOSIS — H5203 Hypermetropia, bilateral: Secondary | ICD-10-CM | POA: Diagnosis not present

## 2022-12-25 ENCOUNTER — Ambulatory Visit (HOSPITAL_BASED_OUTPATIENT_CLINIC_OR_DEPARTMENT_OTHER): Payer: HMO | Admitting: Family

## 2022-12-25 DIAGNOSIS — S138XXA Sprain of joints and ligaments of other parts of neck, initial encounter: Secondary | ICD-10-CM | POA: Diagnosis not present

## 2022-12-25 DIAGNOSIS — M9905 Segmental and somatic dysfunction of pelvic region: Secondary | ICD-10-CM | POA: Diagnosis not present

## 2022-12-25 DIAGNOSIS — M9901 Segmental and somatic dysfunction of cervical region: Secondary | ICD-10-CM | POA: Diagnosis not present

## 2022-12-25 DIAGNOSIS — M5134 Other intervertebral disc degeneration, thoracic region: Secondary | ICD-10-CM | POA: Diagnosis not present

## 2022-12-25 DIAGNOSIS — S39012A Strain of muscle, fascia and tendon of lower back, initial encounter: Secondary | ICD-10-CM | POA: Diagnosis not present

## 2022-12-25 DIAGNOSIS — S338XXA Sprain of other parts of lumbar spine and pelvis, initial encounter: Secondary | ICD-10-CM | POA: Diagnosis not present

## 2022-12-25 DIAGNOSIS — M9902 Segmental and somatic dysfunction of thoracic region: Secondary | ICD-10-CM | POA: Diagnosis not present

## 2022-12-25 DIAGNOSIS — M9903 Segmental and somatic dysfunction of lumbar region: Secondary | ICD-10-CM | POA: Diagnosis not present

## 2023-01-01 DIAGNOSIS — M5134 Other intervertebral disc degeneration, thoracic region: Secondary | ICD-10-CM | POA: Diagnosis not present

## 2023-01-01 DIAGNOSIS — M9901 Segmental and somatic dysfunction of cervical region: Secondary | ICD-10-CM | POA: Diagnosis not present

## 2023-01-01 DIAGNOSIS — S39012A Strain of muscle, fascia and tendon of lower back, initial encounter: Secondary | ICD-10-CM | POA: Diagnosis not present

## 2023-01-01 DIAGNOSIS — M9905 Segmental and somatic dysfunction of pelvic region: Secondary | ICD-10-CM | POA: Diagnosis not present

## 2023-01-01 DIAGNOSIS — M9903 Segmental and somatic dysfunction of lumbar region: Secondary | ICD-10-CM | POA: Diagnosis not present

## 2023-01-01 DIAGNOSIS — S138XXA Sprain of joints and ligaments of other parts of neck, initial encounter: Secondary | ICD-10-CM | POA: Diagnosis not present

## 2023-01-01 DIAGNOSIS — M9902 Segmental and somatic dysfunction of thoracic region: Secondary | ICD-10-CM | POA: Diagnosis not present

## 2023-01-01 DIAGNOSIS — S338XXA Sprain of other parts of lumbar spine and pelvis, initial encounter: Secondary | ICD-10-CM | POA: Diagnosis not present

## 2023-01-09 DIAGNOSIS — R0789 Other chest pain: Secondary | ICD-10-CM | POA: Diagnosis not present

## 2023-01-09 DIAGNOSIS — E782 Mixed hyperlipidemia: Secondary | ICD-10-CM | POA: Diagnosis not present

## 2023-01-09 DIAGNOSIS — M9903 Segmental and somatic dysfunction of lumbar region: Secondary | ICD-10-CM | POA: Diagnosis not present

## 2023-01-09 DIAGNOSIS — R079 Chest pain, unspecified: Secondary | ICD-10-CM | POA: Diagnosis not present

## 2023-01-09 DIAGNOSIS — S338XXA Sprain of other parts of lumbar spine and pelvis, initial encounter: Secondary | ICD-10-CM | POA: Diagnosis not present

## 2023-01-09 DIAGNOSIS — M5134 Other intervertebral disc degeneration, thoracic region: Secondary | ICD-10-CM | POA: Diagnosis not present

## 2023-01-09 DIAGNOSIS — I10 Essential (primary) hypertension: Secondary | ICD-10-CM | POA: Diagnosis not present

## 2023-01-09 DIAGNOSIS — M9902 Segmental and somatic dysfunction of thoracic region: Secondary | ICD-10-CM | POA: Diagnosis not present

## 2023-01-09 DIAGNOSIS — M9905 Segmental and somatic dysfunction of pelvic region: Secondary | ICD-10-CM | POA: Diagnosis not present

## 2023-01-09 DIAGNOSIS — M9901 Segmental and somatic dysfunction of cervical region: Secondary | ICD-10-CM | POA: Diagnosis not present

## 2023-01-09 DIAGNOSIS — Z0000 Encounter for general adult medical examination without abnormal findings: Secondary | ICD-10-CM | POA: Diagnosis not present

## 2023-01-09 DIAGNOSIS — I700 Atherosclerosis of aorta: Secondary | ICD-10-CM | POA: Diagnosis not present

## 2023-01-09 DIAGNOSIS — S138XXA Sprain of joints and ligaments of other parts of neck, initial encounter: Secondary | ICD-10-CM | POA: Diagnosis not present

## 2023-01-09 DIAGNOSIS — S39012A Strain of muscle, fascia and tendon of lower back, initial encounter: Secondary | ICD-10-CM | POA: Diagnosis not present

## 2023-01-10 ENCOUNTER — Other Ambulatory Visit: Payer: Self-pay | Admitting: Internal Medicine

## 2023-01-10 DIAGNOSIS — Z0000 Encounter for general adult medical examination without abnormal findings: Secondary | ICD-10-CM

## 2023-01-13 ENCOUNTER — Encounter: Payer: Self-pay | Admitting: Internal Medicine

## 2023-01-16 DIAGNOSIS — M9903 Segmental and somatic dysfunction of lumbar region: Secondary | ICD-10-CM | POA: Diagnosis not present

## 2023-01-16 DIAGNOSIS — M9901 Segmental and somatic dysfunction of cervical region: Secondary | ICD-10-CM | POA: Diagnosis not present

## 2023-01-16 DIAGNOSIS — S138XXA Sprain of joints and ligaments of other parts of neck, initial encounter: Secondary | ICD-10-CM | POA: Diagnosis not present

## 2023-01-16 DIAGNOSIS — S338XXA Sprain of other parts of lumbar spine and pelvis, initial encounter: Secondary | ICD-10-CM | POA: Diagnosis not present

## 2023-01-16 DIAGNOSIS — S39012A Strain of muscle, fascia and tendon of lower back, initial encounter: Secondary | ICD-10-CM | POA: Diagnosis not present

## 2023-01-16 DIAGNOSIS — M5134 Other intervertebral disc degeneration, thoracic region: Secondary | ICD-10-CM | POA: Diagnosis not present

## 2023-01-16 DIAGNOSIS — M9905 Segmental and somatic dysfunction of pelvic region: Secondary | ICD-10-CM | POA: Diagnosis not present

## 2023-01-16 DIAGNOSIS — M9902 Segmental and somatic dysfunction of thoracic region: Secondary | ICD-10-CM | POA: Diagnosis not present

## 2023-01-17 ENCOUNTER — Ambulatory Visit (INDEPENDENT_AMBULATORY_CARE_PROVIDER_SITE_OTHER): Payer: HMO | Admitting: Gastroenterology

## 2023-01-17 ENCOUNTER — Encounter: Payer: Self-pay | Admitting: Gastroenterology

## 2023-01-17 VITALS — BP 110/60 | HR 79

## 2023-01-17 DIAGNOSIS — R0789 Other chest pain: Secondary | ICD-10-CM | POA: Diagnosis not present

## 2023-01-17 DIAGNOSIS — Z1211 Encounter for screening for malignant neoplasm of colon: Secondary | ICD-10-CM | POA: Diagnosis not present

## 2023-01-17 DIAGNOSIS — K219 Gastro-esophageal reflux disease without esophagitis: Secondary | ICD-10-CM | POA: Diagnosis not present

## 2023-01-17 MED ORDER — NA SULFATE-K SULFATE-MG SULF 17.5-3.13-1.6 GM/177ML PO SOLN: 1.0000 | ORAL | 0 refills | Status: DC

## 2023-01-17 NOTE — Patient Instructions (Addendum)
_______________________________________________________  If your blood pressure at your visit was 140/90 or greater, please contact your primary care physician to follow up on this. _______________________________________________________  If you are age 73 or older, your body mass index should be between 23-30. Your There is no height or weight on file to calculate BMI. If this is out of the aforementioned range listed, please consider follow up with your Primary Care Provider. ________________________________________________________  The [facility] providers would like to encourage you to use [facility] to communicate with providers for non-urgent requests or questions.  Due to long hold times on the telephone, sending your provider a message by [facility] may be a faster and more efficient way to get a response.  Please allow 48 business hours for a response.  Please remember that this is for non-urgent requests.  _______________________________________________________  Bonita Quin have been scheduled for a colonoscopy. Please follow written instructions given to you at your visit today.   Please pick up your prep supplies at the pharmacy within the next 1-3 days.  If you use inhalers (even only as needed), please bring them with you on the day of your procedure.  DO NOT TAKE 7 DAYS PRIOR TO TEST- Trulicity (dulaglutide) Ozempic, Wegovy (semaglutide) Mounjaro (tirzepatide) Bydureon Bcise (exanatide extended release)  DO NOT TAKE 1 DAY PRIOR TO YOUR TEST Rybelsus (semaglutide) Adlyxin (lixisenatide) Victoza (liraglutide) Byetta (exanatide) ___________________________________________________________________________  Due to recent changes in healthcare laws, you may see the results of your imaging and laboratory studies on MyChart before your provider has had a chance to review them.  We understand that in some cases there may be results that are confusing or concerning to you. Not all laboratory  results come back in the same time frame and the provider may be waiting for multiple results in order to interpret others.  Please give [place] 48 hours in order for your provider to thoroughly review all the results before contacting the office for clarification of your results.   Thank you for entrusting me with your care and choosing [facility].  [place], PA-C

## 2023-01-17 NOTE — Progress Notes (Signed)
Chief Complaint: Atypical chest pain Primary GI MD: Dr. Marina Goodell  HPI: 72 year old female with medical history as listed below presents for evaluation of atypical chest pain  Last seen in 2018 by Jacqueline Myers for BRBPR and history of hemorrhoids.  Patient was treated with Preparation H suppositories.  Recent lab work 09/2022 including CBC, CMP, TSH was unrevealing CT abdomen pelvis with contrast 07/2021 done for abdominal pain showed diverticulosis without diverticulitis.  Otherwise unrevealing.  Recently seen 11/08/2022 by cardiology for atypical chest pain.  Reported mid epigastric fullness and squeezing occurring 3 times over the last few months.  Also noted 5 episodes of chest discomfort that improved when she stood up.  Cardiology felt chest pain was GI in nature and referred her here.  Patient states she has not had any further episodes of atypical chest pain since her visit with cardiology.  She feels it is not an issue at this time.  She takes omeprazole 20 Mg once daily. No breakthrough symptoms. She feels EGD is not necessary.  Due for her screening colonoscopy.  Denies change in bowel habits.  Denies rectal bleeding.  Denies nausea/vomiting.   PREVIOUS GI WORKUP   colonoscopy was September 2014 done for history of polyps and she was found to have 2 diminutive polyps in the sigmoid colon both of which were hyperplastic, moderate diverticulosis and internal hemorrhoids. She was indicated for 10 year interval follow-up.  history of GERD with stricture and underwent EGD with dilation in November 2016    Past Medical History:  Diagnosis Date   Colon polyps    hyperplastic   Diverticulosis    Esophageal stricture    GERD (gastroesophageal reflux disease)    History of bronchitis    Hyperlipidemia    Hypertension    Mitral valve prolapse    Osteoarthritis    Osteoporosis    Spinal stenosis     Past Surgical History:  Procedure Laterality Date   COLONOSCOPY     NM MYOVIEW  LTD  08/2006   normal for abn ekg   TONSILLECTOMY     TOTAL KNEE ARTHROPLASTY Left 04/14/2015   Procedure: LEFT TOTAL KNEE ARTHROPLASTY;  Surgeon: Ollen Gross, MD;  Location: [facility];  Service: Orthopedics;  Laterality: Left;    Current Outpatient Medications  Medication Sig Dispense Refill   acetaminophen (TYLENOL) 500 MG tablet Take 500-1,000 mg by mouth every 6 (six) hours as needed for moderate pain.     Ascorbic Acid (VITAMIN C) 1000 MG tablet Take 1,000 mg by mouth daily.     Candesartan Cilexetil-HCTZ 32-25 MG TABS Take 1 tablet by mouth daily. 90 tablet 3   cholecalciferol (VITAMIN D3) 25 MCG (1000 UNIT) tablet Take 2,000-4,000 Units by mouth.     cyclobenzaprine (FLEXERIL) 10 MG tablet TAKE 1/2 TO 1 (ONE-HALF TO ONE) TABLET BY MOUTH THREE TIMES DAILY AS NEEDED FOR MUSCLE SPASM 30 tablet 0   diclofenac sodium (VOLTAREN) 1 % GEL Apply 4 g topically 4 (four) times daily. 100 g 2   ezetimibe (ZETIA) 10 MG tablet Take 1 tablet (10 mg total) by mouth daily. 90 tablet 3   levothyroxine (SYNTHROID) 50 MCG tablet Take 1/2 (one-half) tablet by mouth once daily 45 tablet 3   Omega-3 Fatty Acids (FISH OIL) 1200 MG CPDR Take 2 capsules by mouth daily.     omeprazole (PRILOSEC) 20 MG capsule Take 1 capsule by mouth once daily 90 capsule 0   ondansetron (ZOFRAN-ODT) 4 MG disintegrating tablet Take 1 tablet (  4 mg total) by mouth every 4 (four) hours as needed for nausea or vomiting. 20 tablet 0   rosuvastatin (CRESTOR) 10 MG tablet Take 0.5 tablets (5 mg total) by mouth daily. 30 tablet 3   valACYclovir (VALTREX) 1000 MG tablet TAKE 1 TABLET BY MOUTH THREE TIMES DAILY AS DIRECTED (Patient not taking: Reported on 11/08/2022) 30 tablet 2   No current facility-administered medications for this visit.    Allergies as of 01/17/2023 - Review Complete 11/08/2022  Allergen Reaction Noted   Other Other (See Comments) 08/19/2015   Lisinopril Cough 09/02/2014   Oxycodone Nausea Only and Palpitations  04/24/2015   Simvastatin Other (See Comments) 05/25/2008    Family History  Problem Relation Age of Onset   Breast cancer Sister    Alcohol abuse Mother    Stroke Mother    Hypertension Mother    Diabetes Mother    Hyperlipidemia Mother    Hyperlipidemia Father    Colon cancer Neg Hx     Social History   Socioeconomic History   Marital status: Married    Spouse name: Not on file   Number of children: 2   Years of education: Not on file   Highest education level: Master's degree (e.g., MA, MS, MEng, MEd, MSW, MBA)  Occupational History   Occupation: Retired  Tobacco Use   Smoking status: Former    Current packs/day: 0.00    Average packs/day: 1 pack/day for 29.0 years (29.0 ttl pk-yrs)    Types: Cigarettes    Start date: 05/08/1970    Quit date: 05/09/1999    Years since quitting: 23.7   Smokeless tobacco: Never  Vaping Use   Vaping status: Never Used  Substance and Sexual Activity   Alcohol use: Yes    Alcohol/week: 0.0 standard drinks of alcohol    Comment: OCC- [place] W[place]   Drug use: No   Sexual activity: Not Currently  Other Topics Concern   Not on file  Social History Narrative   Married   HH of 2 helps take care 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   Neg td neg ets   Social Determinants of Health   Financial Resource Strain: Low Risk  (12/13/2021)   Overall Financial Resource Strain (CARDIA)    Difficulty of Paying Living Expenses: Not hard at all  Food Insecurity: No Food Insecurity (12/13/2021)   Hunger Vital Sign    Worried About Running Out of Food in the Last Year: Never true    Ran Out of Food in the Last Year: Never true  Transportation Needs: No Transportation Needs (12/13/2021)   PRAPARE - Administrator, Civil Service (Medical): No    Lack of Transportation (Non-Medical): No  Physical Activity: Sufficiently  Active (09/19/2022)   Exercise Vital Sign    Days of Exercise per Week: 4 days    Minutes of Exercise per Session: 150+ min  Stress: No Stress Concern Present (09/19/2022)   [employer] of Occupational Health - Occupational Stress Questionnaire    Feeling of Stress : Not at all  Social Connections: Socially Integrated (09/17/2021)   Social Connection and Isolation Panel [NHANES]    Frequency of Communication with Friends and Family: Twice a week    Frequency of Social Gatherings with Friends and Family: Twice a week    Attends Religious Services: More  than 4 times per year    Active Member of Clubs or Organizations: Yes    Attends Banker Meetings: More than 4 times per year    Marital Status: Married  Catering manager Violence: Not At Risk (12/07/2020)   Humiliation, Afraid, Rape, and Kick questionnaire    Fear of Current or Ex-Partner: No    Emotionally Abused: No    Physically Abused: No    Sexually Abused: No    Review of Systems:    Constitutional: No weight loss, fever, chills, weakness or fatigue HEENT: Eyes: No change in vision               Ears, Nose, Throat:  No change in hearing or congestion Skin: No rash or itching Cardiovascular: No chest pain, chest pressure or palpitations   Respiratory: No SOB or cough Gastrointestinal: See HPI and otherwise negative Genitourinary: No dysuria or change in urinary frequency Neurological: No headache, dizziness or syncope Musculoskeletal: No new muscle or joint pain Hematologic: No bleeding or bruising Psychiatric: No history of depression or anxiety    Physical Exam:  Vital signs: There were no vitals taken for this visit.  Constitutional: NAD, Well developed, Well nourished, alert and cooperative Head:  Normocephalic and atraumatic. Eyes:   PEERL, EOMI. No icterus. Conjunctiva pink. Respiratory: Respirations even and unlabored. Lungs clear to auscultation bilaterally.   No wheezes, crackles, or rhonchi.   Cardiovascular:  Regular rate and rhythm. No peripheral edema, cyanosis or pallor.  Gastrointestinal:  Soft, nondistended, nontender. No rebound or guarding. Normal bowel sounds. No appreciable masses or hepatomegaly. Rectal:  Not performed.  Msk:  Symmetrical without gross deformities. Without edema, no deformity or joint abnormality.  Neurologic:  Alert and  oriented x4;  grossly normal neurologically.  Skin:   Dry and intact without significant lesions or rashes. Psychiatric: Oriented to person, place and time. Demonstrates good judgement and reason without abnormal affect or behaviors.   RELEVANT LABS AND IMAGING: CBC    Component Value Date/Time   WBC 7.9 09/19/2022 1254   RBC 4.48 09/19/2022 1254   HGB 13.5 09/19/2022 1254   HCT 39.6 09/19/2022 1254   PLT 286.0 09/19/2022 1254   MCV 88.5 09/19/2022 1254   MCH 28.7 07/17/2021 1607   MCHC 34.1 09/19/2022 1254   RDW 14.1 09/19/2022 1254   LYMPHSABS 2.5 09/19/2022 1254   MONOABS 0.7 09/19/2022 1254   EOSABS 0.2 09/19/2022 1254   BASOSABS 0.0 09/19/2022 1254    CMP     Component Value Date/Time   NA 139 09/19/2022 1254   K 3.7 09/19/2022 1254   CL 100 09/19/2022 1254   CO2 27 09/19/2022 1254   GLUCOSE 89 09/19/2022 1254   BUN 27 (H) 09/19/2022 1254   CREATININE 0.90 09/19/2022 1254   CREATININE 0.79 03/27/2016 1021   CALCIUM 9.8 09/19/2022 1254   PROT 6.9 01/09/2023 0826   ALBUMIN 4.5 01/09/2023 0826   AST 25 01/09/2023 0826   ALT 24 01/09/2023 0826   ALKPHOS 74 01/09/2023 0826   BILITOT 0.4 01/09/2023 0826   GFRNONAA >60 07/17/2021 1607   GFRAA >60 04/24/2015 1635     Assessment/Plan:   Screening for colon cancer Due for repeat colonoscopy Last one in 2014 with hyperplastic polyps, hemorrhoids, diverticulosis --- schedule colonoscopy --- I thoroughly discussed the procedure with the patient (at bedside) to include nature of the procedure, alternatives, benefits, and risks (including but not limited to  bleeding, infection, perforation, anesthesia/cardiac pulmonary complications).  Patient  verbalized understanding and gave verbal consent to proceed with procedure.   Gastroesophageal reflux disease, unspecified whether esophagitis present Well controlled on omeprazole 20mg  every day without breakthrough symptoms.  -- educated patient on lifestyle modifications -- provided patient education handout  Atypical chest pain 5 episodes over the last few months with last episode being over 2 months ago. Negative cardiac workup. Likely breakthrough GERD. Patient declined EGD and I agree that is a reasonable decision in the absence of symptoms --- If symptoms recur, let us know and we can consider EGD at that time - Continue omeprazole 20 Mg daily  Cletis Muma Jolee Ewing [facility] 01/17/2023, 8:36 AM  Cc: Madelin Headings, MD

## 2023-01-17 NOTE — Progress Notes (Signed)
Agree with assessment and plans as outlined 

## 2023-01-18 ENCOUNTER — Encounter: Payer: Self-pay | Admitting: Internal Medicine

## 2023-01-22 ENCOUNTER — Encounter: Payer: Self-pay | Admitting: Family Medicine

## 2023-01-22 ENCOUNTER — Ambulatory Visit (INDEPENDENT_AMBULATORY_CARE_PROVIDER_SITE_OTHER): Payer: HMO | Admitting: Family Medicine

## 2023-01-22 VITALS — BP 134/68 | HR 94 | Temp 98.8°F | Wt 148.0 lb

## 2023-01-22 DIAGNOSIS — N390 Urinary tract infection, site not specified: Secondary | ICD-10-CM

## 2023-01-22 DIAGNOSIS — R300 Dysuria: Secondary | ICD-10-CM

## 2023-01-22 LAB — POC URINALSYSI DIPSTICK (AUTOMATED)
Bilirubin, UA: NEGATIVE
Glucose, UA: NEGATIVE
Ketones, UA: NEGATIVE
Nitrite, UA: NEGATIVE
Protein, UA: NEGATIVE
Spec Grav, UA: <=1.005 — AB (ref 1.010–1.025)
Urobilinogen, UA: 0.2 U/dL
pH, UA: 6 (ref 5.0–8.0)

## 2023-01-22 MED ORDER — CIPROFLOXACIN HCL 500 MG PO TABS: 500.0000 mg | ORAL_TABLET | Freq: Two times a day (BID) | ORAL | 0 refills | Status: DC

## 2023-01-22 NOTE — Progress Notes (Signed)
Subjective:    Patient ID: Jacqueline Myers, female    DOB: 25-Jan-1951, 72 y.o.   MRN: [id number]  HPI Here for 2 days of urgency to urinate and right flank pain. No fever or nausea. She drinks plenty water.  BM's are regular.    Review of Systems  Constitutional: Negative.   Respiratory: Negative.    Cardiovascular: Negative.   Gastrointestinal: Negative.   Genitourinary:  Positive for flank pain, frequency and urgency. Negative for dysuria and hematuria.       Objective:   Physical Exam Constitutional:      Appearance: Normal appearance. She is not ill-appearing.  Cardiovascular:     Rate and Rhythm: Normal rate and regular rhythm.     Pulses: Normal pulses.     Heart sounds: Normal heart sounds.  Pulmonary:     Effort: Pulmonary effort is normal.     Breath sounds: Normal breath sounds.  Abdominal:     General: Abdomen is flat. Bowel sounds are normal.     Palpations: Abdomen is soft. There is no mass.     Tenderness: There is no right CVA tenderness, left CVA tenderness, guarding or rebound.     Hernia: No hernia is present.     Comments: Right flank is tender   Neurological:     Mental Status: She is alert.           Assessment & Plan:  UTI, treat with 7 days of Cipro. Culture the sample.  Gershon Crane, MD

## 2023-01-23 DIAGNOSIS — M9905 Segmental and somatic dysfunction of pelvic region: Secondary | ICD-10-CM | POA: Diagnosis not present

## 2023-01-23 DIAGNOSIS — M9903 Segmental and somatic dysfunction of lumbar region: Secondary | ICD-10-CM | POA: Diagnosis not present

## 2023-01-23 DIAGNOSIS — M5134 Other intervertebral disc degeneration, thoracic region: Secondary | ICD-10-CM | POA: Diagnosis not present

## 2023-01-23 DIAGNOSIS — S138XXA Sprain of joints and ligaments of other parts of neck, initial encounter: Secondary | ICD-10-CM | POA: Diagnosis not present

## 2023-01-23 DIAGNOSIS — S338XXA Sprain of other parts of lumbar spine and pelvis, initial encounter: Secondary | ICD-10-CM | POA: Diagnosis not present

## 2023-01-23 DIAGNOSIS — M9901 Segmental and somatic dysfunction of cervical region: Secondary | ICD-10-CM | POA: Diagnosis not present

## 2023-01-23 DIAGNOSIS — S39012A Strain of muscle, fascia and tendon of lower back, initial encounter: Secondary | ICD-10-CM | POA: Diagnosis not present

## 2023-01-23 DIAGNOSIS — M9902 Segmental and somatic dysfunction of thoracic region: Secondary | ICD-10-CM | POA: Diagnosis not present

## 2023-02-01 ENCOUNTER — Encounter: Payer: Self-pay | Admitting: Internal Medicine

## 2023-02-01 ENCOUNTER — Ambulatory Visit: Payer: HMO | Admitting: Internal Medicine

## 2023-02-01 VITALS — BP 86/32 | HR 74 | Temp 98.3°F | Resp 11 | Ht 61.0 in | Wt 142.8 lb

## 2023-02-01 DIAGNOSIS — Z1211 Encounter for screening for malignant neoplasm of colon: Secondary | ICD-10-CM | POA: Diagnosis not present

## 2023-02-01 DIAGNOSIS — E669 Obesity, unspecified: Secondary | ICD-10-CM | POA: Diagnosis not present

## 2023-02-01 DIAGNOSIS — D122 Benign neoplasm of ascending colon: Secondary | ICD-10-CM | POA: Diagnosis not present

## 2023-02-01 DIAGNOSIS — I10 Essential (primary) hypertension: Secondary | ICD-10-CM | POA: Diagnosis not present

## 2023-02-01 MED ORDER — SODIUM CHLORIDE 0.9 % IV SOLN: 500.0000 mL | Freq: Once | INTRAVENOUS | Status: AC

## 2023-02-01 NOTE — Progress Notes (Signed)
Report to PACU, RN, vss, BBS= Clear.  

## 2023-02-01 NOTE — Progress Notes (Signed)
Expand All Collapse All    Chief Complaint: Atypical chest pain Primary GI MD: Dr. Marina Goodell   HPI: 72 year old female with medical history as listed below presents for evaluation of atypical chest pain   Last seen in 2018 by Mike Gip for BRBPR and history of hemorrhoids.  Patient was treated with Preparation H suppositories.   Recent lab work 09/2022 including CBC, CMP, TSH was unrevealing CT abdomen pelvis with contrast 07/2021 done for abdominal pain showed diverticulosis without diverticulitis.  Otherwise unrevealing.   Recently seen 11/08/2022 by cardiology for atypical chest pain.  Reported mid epigastric fullness and squeezing occurring 3 times over the last few months.  Also noted 5 episodes of chest discomfort that improved when she stood up.  Cardiology felt chest pain was GI in nature and referred her here.   Patient states she has not had any further episodes of atypical chest pain since her visit with cardiology.  She feels it is not an issue at this time.  She takes omeprazole 20 Mg once daily. No breakthrough symptoms. She feels EGD is not necessary.   Due for her screening colonoscopy.  Denies change in bowel habits.  Denies rectal bleeding.  Denies nausea/vomiting.     PREVIOUS GI WORKUP    colonoscopy was September 2014 done for history of polyps and she was found to have 2 diminutive polyps in the sigmoid colon both of which were hyperplastic, moderate diverticulosis and internal hemorrhoids. She was indicated for 10 year interval follow-up.   history of GERD with stricture and underwent EGD with dilation in November 2016         Past Medical History:  Diagnosis Date   Colon polyps      hyperplastic   Diverticulosis     Esophageal stricture     GERD (gastroesophageal reflux disease)     History of bronchitis     Hyperlipidemia     Hypertension     Mitral valve prolapse     Osteoarthritis     Osteoporosis     Spinal stenosis                 Past Surgical  History:  Procedure Laterality Date   COLONOSCOPY       NM MYOVIEW LTD   08/2006    normal for abn ekg   TONSILLECTOMY       TOTAL KNEE ARTHROPLASTY Left 04/14/2015    Procedure: LEFT TOTAL KNEE ARTHROPLASTY;  Surgeon: Ollen Gross, MD;  Location: [facility];  Service: Orthopedics;  Laterality: Left;                Current Outpatient Medications  Medication Sig Dispense Refill   acetaminophen (TYLENOL) 500 MG tablet Take 500-1,000 mg by mouth every 6 (six) hours as needed for moderate pain.       Ascorbic Acid (VITAMIN C) 1000 MG tablet Take 1,000 mg by mouth daily.       Candesartan Cilexetil-HCTZ 32-25 MG TABS Take 1 tablet by mouth daily. 90 tablet 3   cholecalciferol (VITAMIN D3) 25 MCG (1000 UNIT) tablet Take 2,000-4,000 Units by mouth.       cyclobenzaprine (FLEXERIL) 10 MG tablet TAKE 1/2 TO 1 (ONE-HALF TO ONE) TABLET BY MOUTH THREE TIMES DAILY AS NEEDED FOR MUSCLE SPASM 30 tablet 0   diclofenac sodium (VOLTAREN) 1 % GEL Apply 4 g topically 4 (four) times daily. 100 g 2   ezetimibe (ZETIA) 10 MG tablet Take 1 tablet (10 mg total) by  mouth daily. 90 tablet 3   levothyroxine (SYNTHROID) 50 MCG tablet Take 1/2 (one-half) tablet by mouth once daily 45 tablet 3   Omega-3 Fatty Acids (FISH OIL) 1200 MG CPDR Take 2 capsules by mouth daily.       omeprazole (PRILOSEC) 20 MG capsule Take 1 capsule by mouth once daily 90 capsule 0   ondansetron (ZOFRAN-ODT) 4 MG disintegrating tablet Take 1 tablet (4 mg total) by mouth every 4 (four) hours as needed for nausea or vomiting. 20 tablet 0   rosuvastatin (CRESTOR) 10 MG tablet Take 0.5 tablets (5 mg total) by mouth daily. 30 tablet 3   valACYclovir (VALTREX) 1000 MG tablet TAKE 1 TABLET BY MOUTH THREE TIMES DAILY AS DIRECTED (Patient not taking: Reported on 11/08/2022) 30 tablet 2      No current facility-administered medications for this visit.             Allergies as of 01/17/2023 - Review Complete 11/08/2022  Allergen Reaction Noted    Other Other (See Comments) 08/19/2015   Lisinopril Cough 09/02/2014   Oxycodone Nausea Only and Palpitations 04/24/2015   Simvastatin Other (See Comments) 05/25/2008           Family History  Problem Relation Age of Onset   Breast cancer Sister     Alcohol abuse Mother     Stroke Mother     Hypertension Mother     Diabetes Mother     Hyperlipidemia Mother     Hyperlipidemia Father     Colon cancer Neg Hx            Social History         Socioeconomic History   Marital status: Married      Spouse name: Not on file   Number of children: 2   Years of education: Not on file   Highest education level: Master's degree (e.g., MA, MS, MEng, MEd, MSW, MBA)  Occupational History   Occupation: Retired  Tobacco Use   Smoking status: Former      Current packs/day: 0.00      Average packs/day: 1 pack/day for 29.0 years (29.0 ttl pk-yrs)      Types: Cigarettes      Start date: 05/08/1970      Quit date: 05/09/1999      Years since quitting: 23.7   Smokeless tobacco: Never  Vaping Use   Vaping status: Never Used  Substance and Sexual Activity   Alcohol use: Yes      Alcohol/week: 0.0 standard drinks of alcohol      Comment: OCC- GL[place] OF WINE   Drug use: No   Sexual activity: Not Currently  Other Topics Concern   Not on file  Social History Narrative    Married    HH of 2 helps take care 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    Neg td neg ets    Social Determinants of Health        Financial Resource Strain: Low Risk  (12/13/2021)    Overall Financial Resource Strain (CARDIA)     Difficulty of Paying Living Expenses: Not hard at all  Food Insecurity: No Food Insecurity (12/13/2021)    Hunger Vital Sign     Worried About Running Out of Food in the Last Year: Never true     Ran Out  of Food in the Last Year: Never true  Transportation Needs: No  Transportation Needs (12/13/2021)    PRAPARE - Therapist, art (Medical): No     Lack of Transportation (Non-Medical): No  Physical Activity: Sufficiently Active (09/19/2022)    Exercise Vital Sign     Days of Exercise per Week: 4 days     Minutes of Exercise per Session: 150+ min  Stress: No Stress Concern Present (09/19/2022)    [employer] of Occupational Health - Occupational Stress Questionnaire     Feeling of Stress : Not at all  Social Connections: Socially Integrated (09/17/2021)    Social Connection and Isolation Panel [NHANES]     Frequency of Communication with Friends and Family: Twice a week     Frequency of Social Gatherings with Friends and Family: Twice a week     Attends Religious Services: More than 4 times per year     Active Member of [employer] or Organizations: Yes     Attends Engineer, structural: More than 4 times per year     Marital Status: Married  Catering manager Violence: Not At Risk (12/07/2020)    Humiliation, Afraid, Rape, and Kick questionnaire     Fear of Current or Ex-Partner: No     Emotionally Abused: No     Physically Abused: No     Sexually Abused: No      Review of Systems:    Constitutional: No weight loss, fever, chills, weakness or fatigue HEENT: Eyes: No change in vision               Ears, Nose, Throat:  No change in hearing or congestion Skin: No rash or itching Cardiovascular: No chest pain, chest pressure or palpitations   Respiratory: No SOB or cough Gastrointestinal: See HPI and otherwise negative Genitourinary: No dysuria or change in urinary frequency Neurological: No headache, dizziness or syncope Musculoskeletal: No new muscle or joint pain Hematologic: No bleeding or bruising Psychiatric: No history of depression or anxiety      Physical Exam:  Vital signs: There were no vitals taken for this visit.   Constitutional: NAD, Well developed, Well nourished, alert and cooperative Head:   Normocephalic and atraumatic. Eyes:   PEERL, EOMI. No icterus. Conjunctiva pink. Respiratory: Respirations even and unlabored. Lungs clear to auscultation bilaterally.   No wheezes, crackles, or rhonchi.  Cardiovascular:  Regular rate and rhythm. No peripheral edema, cyanosis or pallor.  Gastrointestinal:  Soft, nondistended, nontender. No rebound or guarding. Normal bowel sounds. No appreciable masses or hepatomegaly. Rectal:  Not performed.  Msk:  Symmetrical without gross deformities. Without edema, no deformity or joint abnormality.  Neurologic:  Alert and  oriented x4;  grossly normal neurologically.  Skin:   Dry and intact without significant lesions or rashes. Psychiatric: Oriented to person, place and time. Demonstrates good judgement and reason without abnormal affect or behaviors.     RELEVANT LABS AND IMAGING: CBC Labs (Brief)          Component Value Date/Time    WBC 7.9 09/19/2022 1254    RBC 4.48 09/19/2022 1254    HGB 13.5 09/19/2022 1254    HCT 39.6 09/19/2022 1254    PLT 286.0 09/19/2022 1254    MCV 88.5 09/19/2022 1254    MCH 28.7 07/17/2021 1607    MCHC 34.1 09/19/2022 1254    RDW 14.1 09/19/2022 1254    LYMPHSABS 2.5 09/19/2022 1254    MONOABS 0.7  09/19/2022 1254    EOSABS 0.2 09/19/2022 1254    BASOSABS 0.0 09/19/2022 1254        CMP     Labs (Brief)          Component Value Date/Time    NA 139 09/19/2022 1254    K 3.7 09/19/2022 1254    CL 100 09/19/2022 1254    CO2 27 09/19/2022 1254    GLUCOSE 89 09/19/2022 1254    BUN 27 (H) 09/19/2022 1254    CREATININE 0.90 09/19/2022 1254    CREATININE 0.79 03/27/2016 1021    CALCIUM 9.8 09/19/2022 1254    PROT 6.9 01/09/2023 0826    ALBUMIN 4.5 01/09/2023 0826    AST 25 01/09/2023 0826    ALT 24 01/09/2023 0826    ALKPHOS 74 01/09/2023 0826    BILITOT 0.4 01/09/2023 0826    GFRNONAA >60 07/17/2021 1607    GFRAA >60 04/24/2015 1635          Assessment/Plan:    Screening for colon cancer Due  for repeat colonoscopy Last one in 2014 with hyperplastic polyps, hemorrhoids, diverticulosis --- schedule colonoscopy --- I thoroughly discussed the procedure with the patient (at bedside) to include nature of the procedure, alternatives, benefits, and risks (including but not limited to bleeding, infection, perforation, anesthesia/cardiac pulmonary complications).  Patient verbalized understanding and gave verbal consent to proceed with procedure.    Gastroesophageal reflux disease, unspecified whether esophagitis present Well controlled on omeprazole 20mg  every day without breakthrough symptoms.  -- educated patient on lifestyle modifications -- provided patient education handout   Atypical chest pain 5 episodes over the last few months with last episode being over 2 months ago. Negative cardiac workup. Likely breakthrough GERD. Patient declined EGD and I agree that is a reasonable decision in the absence of symptoms --- If symptoms recur, let us know and we can consider EGD at that time - Continue omeprazole 20 Mg daily   Bayley Jolee Ewing [facility] 01/17/2023, 8:36 AM

## 2023-02-01 NOTE — Progress Notes (Signed)
Called to room to assist during endoscopic procedure.  Patient ID and intended procedure confirmed with present staff. Received instructions for my participation in the procedure from the performing physician.  

## 2023-02-01 NOTE — Op Note (Signed)
[facility] Patient Name: Jacqueline Myers Procedure Date: 02/01/2023 2:23 PM MRN: [id number] Endoscopist: Wilhemina Bonito. Marina Goodell , MD, [id number] Age: 72 Referring MD:  Date of Birth: 02-06-1951 Gender: Female Account #: [id number] Procedure:                Colonoscopy w/ cold snare x 2 Indications:              Screening for colorectal malignant neoplasm. prior                            2006, 2014 were negative Medicines:                Monitored Anesthesia Care Procedure:                Pre-Anesthesia Assessment:                           - Prior to the procedure, a History and Physical                            was performed, and patient medications and                            allergies were reviewed. The patient's tolerance of                            previous anesthesia was also reviewed. The risks                            and benefits of the procedure and the sedation                            options and risks were discussed with the patient.                            All questions were answered, and informed consent                            was obtained. Prior Anticoagulants: The patient has                            taken no anticoagulant or antiplatelet agents. ASA                            Grade Assessment: II - A patient with mild systemic                            disease. After reviewing the risks and benefits,                            the patient was deemed in satisfactory condition to                            undergo the procedure.  After obtaining informed consent, the colonoscope                            was passed under direct vision. Throughout the                            procedure, the patient's blood pressure, pulse, and                            oxygen saturations were monitored continuously. The                            CF [id number] #[id number] was introduced through the anus                            and  advanced to the the cecum, identified by                            appendiceal orifice and ileocecal valve. The                            ileocecal valve, appendiceal orifice, and rectum                            were photographed. The quality of the bowel                            preparation was excellent. The colonoscopy was                            performed without difficulty. The patient tolerated                            the procedure well. The bowel preparation used was                            SUPREP via split dose instruction. Scope In: 2:37:06 PM Scope Out: 2:53:34 PM Scope Withdrawal Time: 0 hours 9 minutes 19 seconds  Total Procedure Duration: 0 hours 16 minutes 28 seconds  Findings:                 Two polyps were found in the ascending colon. The                            polyps were 2 to 3 mm in size. These polyps were                            removed with a cold snare. Resection and retrieval                            were complete.                           Many diverticula were found in the left  colon and                            right colon. Rectosigmoid stenosis                           Internal hemorrhoids were found during retroflexion.                           The exam was otherwise without abnormality on                            direct and retroflexion views. Complications:            No immediate complications. Estimated blood loss:                            None. Estimated Blood Loss:     Estimated blood loss: none. Impression:               - Two 2 to 3 mm polyps in the ascending colon,                            removed with a cold snare. Resected and retrieved.                           - Diverticulosis in the left colon and in the right                            colon.                           - Internal hemorrhoids.                           - The examination was otherwise normal on direct                            and retroflexion  views. Recommendation:           - Repeat colonoscopy is not recommended for                            surveillance.                           - Patient has a contact number available for                            emergencies. The signs and symptoms of potential                            delayed complications were discussed with the                            patient. Return to normal activities tomorrow.  Written discharge instructions were provided to the                            patient.                           - Resume previous diet.                           - Continue present medications.                           - Await pathology results. Wilhemina Bonito. Marina Goodell, MD 02/01/2023 3:05:20 PM This report has been signed electronically.

## 2023-02-01 NOTE — Patient Instructions (Addendum)
Resume previous diet. Continue present medications. Await pathology results.   YOU HAD AN ENDOSCOPIC PROCEDURE TODAY AT THE [facility]:   Refer to the procedure report that was given to you for any specific questions about what was found during the examination.  If the procedure report does not answer your questions, please call your gastroenterologist to clarify.  If you requested that your care partner not be given the details of your procedure findings, then the procedure report has been included in a sealed envelope for you to review at your convenience later.  YOU SHOULD EXPECT: Some feelings of bloating in the abdomen. Passage of more gas than usual.  Walking can help get rid of the air that was put into your GI tract during the procedure and reduce the bloating. If you had a lower endoscopy (such as a colonoscopy or flexible sigmoidoscopy) you may notice spotting of blood in your stool or on the toilet paper. If you underwent a bowel prep for your procedure, you may not have a normal bowel movement for a few days.  Please Note:  You might notice some irritation and congestion in your nose or some drainage.  This is from the oxygen used during your procedure.  There is no need for concern and it should clear up in a day or so.  SYMPTOMS TO REPORT IMMEDIATELY:  Following lower endoscopy (colonoscopy or flexible sigmoidoscopy):  Excessive amounts of blood in the stool  Significant tenderness or worsening of abdominal pains  Swelling of the abdomen that is new, acute  Fever of 100F or higher   For urgent or emergent issues, a gastroenterologist can be reached at any hour by calling [phone]. Do not use MyChart messaging for urgent concerns.    DIET:  We do recommend a small meal at first, but then you may proceed to your regular diet.  Drink plenty of fluids but you should avoid alcoholic beverages for 24 hours.  ACTIVITY:  You should plan to take it easy for the  rest of today and you should NOT DRIVE or use heavy machinery until tomorrow (because of the sedation medicines used during the test).    FOLLOW UP: Our staff will call the number listed on your records the next business day following your procedure.  We will call around 7:15- 8:00 am to check on you and address any questions or concerns that you may have regarding the information given to you following your procedure. If we do not reach you, we will leave a message.     If any biopsies were taken you will be contacted by phone or by letter within the next 1-3 weeks.  Please call us at [phone] if you have not heard about the biopsies in 3 weeks.    SIGNATURES/CONFIDENTIALITY: You and/or your care partner have signed paperwork which will be entered into your electronic medical record.  These signatures attest to the fact that that the information above on your After Visit Summary has been reviewed and is understood.  Full responsibility of the confidentiality of this discharge information lies with you and/or your care-partner.

## 2023-02-02 ENCOUNTER — Telehealth: Payer: Self-pay

## 2023-02-02 NOTE — Telephone Encounter (Signed)
  Follow up Call-     02/01/2023    1:54 PM  Call back number  Post procedure Call Back phone  # [phone]  Permission to leave phone message Yes     Patient questions:  Do you have a fever, pain , or abdominal swelling? No. Pain Score  0 *  Have you tolerated food without any problems? Yes.    Have you been able to return to your normal activities? Yes.    Do you have any questions about your discharge instructions: Diet   No. Medications  No. Follow up visit  No.  Do you have questions or concerns about your Care? No.  Actions: * If pain score is 4 or above: No action needed, pain <4.

## 2023-02-06 ENCOUNTER — Encounter: Payer: Self-pay | Admitting: Internal Medicine

## 2023-02-06 DIAGNOSIS — M9901 Segmental and somatic dysfunction of cervical region: Secondary | ICD-10-CM | POA: Diagnosis not present

## 2023-02-06 DIAGNOSIS — S39012A Strain of muscle, fascia and tendon of lower back, initial encounter: Secondary | ICD-10-CM | POA: Diagnosis not present

## 2023-02-06 DIAGNOSIS — M9905 Segmental and somatic dysfunction of pelvic region: Secondary | ICD-10-CM | POA: Diagnosis not present

## 2023-02-06 DIAGNOSIS — M5134 Other intervertebral disc degeneration, thoracic region: Secondary | ICD-10-CM | POA: Diagnosis not present

## 2023-02-06 DIAGNOSIS — M9903 Segmental and somatic dysfunction of lumbar region: Secondary | ICD-10-CM | POA: Diagnosis not present

## 2023-02-06 DIAGNOSIS — S138XXA Sprain of joints and ligaments of other parts of neck, initial encounter: Secondary | ICD-10-CM | POA: Diagnosis not present

## 2023-02-06 DIAGNOSIS — S338XXA Sprain of other parts of lumbar spine and pelvis, initial encounter: Secondary | ICD-10-CM | POA: Diagnosis not present

## 2023-02-06 DIAGNOSIS — M9902 Segmental and somatic dysfunction of thoracic region: Secondary | ICD-10-CM | POA: Diagnosis not present

## 2023-02-06 LAB — SURGICAL PATHOLOGY

## 2023-02-20 ENCOUNTER — Other Ambulatory Visit: Payer: Self-pay | Admitting: Internal Medicine

## 2023-02-20 DIAGNOSIS — Z0000 Encounter for general adult medical examination without abnormal findings: Secondary | ICD-10-CM

## 2023-02-27 DIAGNOSIS — S338XXA Sprain of other parts of lumbar spine and pelvis, initial encounter: Secondary | ICD-10-CM | POA: Diagnosis not present

## 2023-02-27 DIAGNOSIS — M5134 Other intervertebral disc degeneration, thoracic region: Secondary | ICD-10-CM | POA: Diagnosis not present

## 2023-02-27 DIAGNOSIS — S39012A Strain of muscle, fascia and tendon of lower back, initial encounter: Secondary | ICD-10-CM | POA: Diagnosis not present

## 2023-02-27 DIAGNOSIS — M9903 Segmental and somatic dysfunction of lumbar region: Secondary | ICD-10-CM | POA: Diagnosis not present

## 2023-02-27 DIAGNOSIS — M9905 Segmental and somatic dysfunction of pelvic region: Secondary | ICD-10-CM | POA: Diagnosis not present

## 2023-02-27 DIAGNOSIS — M9901 Segmental and somatic dysfunction of cervical region: Secondary | ICD-10-CM | POA: Diagnosis not present

## 2023-02-27 DIAGNOSIS — M9902 Segmental and somatic dysfunction of thoracic region: Secondary | ICD-10-CM | POA: Diagnosis not present

## 2023-02-27 DIAGNOSIS — S138XXA Sprain of joints and ligaments of other parts of neck, initial encounter: Secondary | ICD-10-CM | POA: Diagnosis not present

## 2023-03-01 DIAGNOSIS — G5601 Carpal tunnel syndrome, right upper limb: Secondary | ICD-10-CM | POA: Diagnosis not present

## 2023-03-01 DIAGNOSIS — M13841 Other specified arthritis, right hand: Secondary | ICD-10-CM | POA: Diagnosis not present

## 2023-03-01 DIAGNOSIS — M65341 Trigger finger, right ring finger: Secondary | ICD-10-CM | POA: Diagnosis not present

## 2023-03-05 ENCOUNTER — Telehealth: Payer: Self-pay

## 2023-03-05 ENCOUNTER — Other Ambulatory Visit: Payer: Self-pay

## 2023-03-05 DIAGNOSIS — Z0000 Encounter for general adult medical examination without abnormal findings: Secondary | ICD-10-CM

## 2023-03-05 MED ORDER — LEVOTHYROXINE SODIUM 50 MCG PO TABS: ORAL_TABLET | ORAL | 3 refills | Status: DC

## 2023-03-05 NOTE — Telephone Encounter (Signed)
Ok to change manufacturer of thyroid replacement . Check  tsh level 3-4 months after changing  manufacturers

## 2023-03-05 NOTE — Telephone Encounter (Signed)
Received a fax from [facility] regards to Levothyroxine .   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 Would it be okay to change manufacturer?"   Please advise.

## 2023-03-06 NOTE — Telephone Encounter (Signed)
Update to pharmacy of provider's message.Spoke to Jacqueline Myers.  Verbalized understanding   Update pt about the message. Verbalized understanding.

## 2023-04-11 ENCOUNTER — Other Ambulatory Visit: Payer: Self-pay | Admitting: Internal Medicine

## 2023-04-11 DIAGNOSIS — Z0000 Encounter for general adult medical examination without abnormal findings: Secondary | ICD-10-CM

## 2023-05-21 DIAGNOSIS — H5203 Hypermetropia, bilateral: Secondary | ICD-10-CM | POA: Diagnosis not present

## 2023-05-21 DIAGNOSIS — H52523 Paresis of accommodation, bilateral: Secondary | ICD-10-CM | POA: Diagnosis not present

## 2023-05-21 DIAGNOSIS — D2310 Other benign neoplasm of skin of unspecified eyelid, including canthus: Secondary | ICD-10-CM | POA: Diagnosis not present

## 2023-05-22 ENCOUNTER — Ambulatory Visit (INDEPENDENT_AMBULATORY_CARE_PROVIDER_SITE_OTHER): Payer: HMO | Admitting: Family Medicine

## 2023-05-22 DIAGNOSIS — Z0000 Encounter for general adult medical examination without abnormal findings: Secondary | ICD-10-CM

## 2023-05-22 NOTE — Patient Instructions (Addendum)
 I really enjoyed getting to talk with you today! I am available on Tuesdays and Thursdays for virtual visits if you have any questions or concerns, or if I can be of any further assistance.   CHECKLIST FROM ANNUAL WELLNESS VISIT:  -Follow up (please call to schedule if not scheduled after visit):   -call today to schedule in office visit with Dr. Charlett regarding the incontinence   -yearly for annual wellness visit with primary care office  Here is a list of your preventive care/health maintenance measures and the plan for each if any are due:  PLAN For any measures below that may be due:  -let us  know when you want to do the bone density test  Health Maintenance  Topic Date Due   COVID-19 Vaccine (7 - 2024-25 season) 03/10/2023   MAMMOGRAM  08/14/2023   Medicare Annual Wellness (AWV)  05/21/2024   DTaP/Tdap/Td (3 - Td or Tdap) 07/03/2024   Colonoscopy  01/31/2033   Pneumonia Vaccine 17+ Years old  Completed   INFLUENZA VACCINE  Completed   DEXA SCAN  Completed   Hepatitis C Screening  Completed   Zoster Vaccines- Shingrix  Completed   HPV VACCINES  Aged Out    -See a dentist at least yearly  -Get your eyes checked and then per your eye specialist's recommendations  -Other issues addressed today:   -I have included below further information regarding a healthy whole foods based diet, physical activity guidelines for adults, stress management and opportunities for social connections. I hope you find this information useful.   -----------------------------------------------------------------------------------------------------------------------------------------------------------------------------------------------------------------------------------------------------------  NUTRITION: -eat real food: lots of colorful vegetables (half the plate) and fruits -5-7 servings of vegetables and fruits per day (fresh or steamed is best), exp. 2 servings of vegetables with lunch and  dinner and 2 servings of fruit per day. Berries and greens such as kale and collards are great choices.  -consume on a regular basis: whole grains (make sure first ingredient on label contains the word whole), fresh fruits, fish, nuts, seeds, healthy oils (such as olive oil, avocado oil, grape seed oil) -may eat small amounts of dairy and lean meat on occasion, but avoid processed meats such as ham, bacon, lunch meat, etc. -drink water -try to avoid fast food and pre-packaged foods, processed meat -most experts advise limiting sodium to < 2300mg  per day, should limit further is any chronic conditions such as high blood pressure, heart disease, diabetes, etc. The American Heart Association advised that < 1500mg  is is ideal -try to avoid foods that contain any ingredients with names you do not recognize  -try to avoid sugar/sweets (except for the natural sugar that occurs in fresh fruit) -try to avoid sweet drinks -try to avoid white rice, white bread, pasta (unless whole grain), white or yellow potatoes  EXERCISE GUIDELINES FOR ADULTS: -if you wish to increase your physical activity, do so gradually and with the approval of your doctor -STOP and seek medical care immediately if you have any chest pain, chest discomfort or trouble breathing when starting or increasing exercise  -move and stretch your body, legs, feet and arms when sitting for long periods -Physical activity guidelines for optimal health in adults: -least 150 minutes per week of aerobic exercise (can talk, but not sing) once approved by your doctor, 20-30 minutes of sustained activity or two 10 minute episodes of sustained activity every day.  -resistance training at least 2 days per week if approved by your doctor -balance exercises 3+ days per  week:   Stand somewhere where you have something sturdy to hold onto if you lose balance.    1) lift up on toes, start with 5x per day and work up to 20x   2) stand and lift on leg  straight out to the side so that foot is a few inches of the floor, start with 5x each side and work up to 20x each side   3) stand on one foot, start with 5 seconds each side and work up to 20 seconds on each side  If you need ideas or help with getting more active:  -Silver sneakers https://tools.silversneakers.com  -Walk with a Doc: Http://www.duncan-williams.com/  -try to include resistance (weight lifting/strength building) and balance exercises twice per week: or the following link for ideas: http://castillo-powell.com/  buyducts.dk  STRESS MANAGEMENT: -can try meditating, or just sitting quietly with deep breathing while intentionally relaxing all parts of your body for 5 minutes daily -if you need further help with stress, anxiety or depression please follow up with your primary doctor or contact the wonderful folks at [facility]: [phone]  SOCIAL CONNECTIONS: -options in [place] if you wish to engage in more social and exercise related activities:  -Silver sneakers https://tools.silversneakers.com  -Walk with a Doc: Http://www.duncan-williams.com/  -Check out the [facility] Active Adults 50+ section on the [place] [employer] (hiking clubs, book clubs, cards and games, chess, exercise classes, aquatic classes and much more) - see the website for details: https://www.[place].gov/departments/parks-recreation/active-adults50  -YouTube has lots of exercise videos for different ages and abilities as well  -[facility] (a variety of indoor and outdoor inperson activities for adults). [phone]. [address]  -Virtual Online Classes (a variety of topics): see seniorplanet.org or call [phone]  -consider volunteering at a school, hospice center, church, senior center or elsewhere

## 2023-05-22 NOTE — Progress Notes (Signed)
 PATIENT CHECK-IN and HEALTH RISK ASSESSMENT QUESTIONNAIRE:  -completed by phone/video for upcoming Medicare Preventive Visit    Pre-Visit Check-in: 1)Vitals (height, wt, BP, etc) - record in vitals section for visit on day of visit Request home vitals (wt, BP, etc.) and enter into vitals, THEN update Vital Signs SmartPhrase below at the top of the HPI. See below.  2)Review and Update Medications, Allergies PMH, Surgeries, Social history in Epic 3)Hospitalizations in the last year with date/reason? no  4)Review and Update Care Team (patient's specialists) in Epic 5) Complete PHQ9 in Epic  6) Complete Fall Screening in Epic 7)Review all Health Maintenance Due and order under PCP if not done.  Medicare Wellness Patient Questionnaire:  Answer theses question about your habits: How often do you have a drink containing alcohol? rarely How many drinks containing alcohol do you have on a typical day when you are drinking? 1 glass How often do you have six or more drinks on one occasion? never Have you ever smoked? yes How many packs a day do/did you smoke?  Smoked over 25 years ago On average, how many days per week do you engage in moderate to strenuous exercise (like a brisk walk)? Does have membership to the [employer], works in the yard a lot.  On average, how many minutes do you engage in exercise at this level? Diet: eggs and toast for breakfast sometimes, bran cereal, lots salads, sandwiches, soup, dinner is usually meat and veggies, some sweets but tries not to sometimes. Eats mostly out of the garden.   Beverages:  water, some milk, some fruit juice and diet pepsi  Answer theses question about your everyday activities: Can you perform most household chores?yes Are you deaf or have significant trouble hearing? No - had hearing check [facility] Do you feel that you have a problem with memory? no Do you feel safe at home?yes Last dentist visit? October - goes twice per year 8. Do you have any  difficulty performing your everyday activities? no Are you having any difficulty walking, taking medications on your own, and or difficulty managing daily home needs?no Do you have difficulty walking or climbing stairs? no Do you have difficulty dressing or bathing?no Do you have difficulty doing errands alone such as visiting a doctor's office or shopping?no Do you currently have any difficulty preparing food and eating?no Do you currently have any difficulty using the toilet?no Do you have any difficulty managing your finances?no Do you have any difficulties with housekeeping of managing your housekeeping?no   Do you have Advanced Directives in place (Living Will, Healthcare Power or Attorney)? yes   Last eye Exam and location? Had eye exam yesterday, Dr. Newt   Do you currently use prescribed or non-prescribed narcotic or opioid pain medications? no  Do you have a history or close family history of breast, ovarian, tubal or peritoneal cancer or a family member with BRCA (breast cancer susceptibility 1 and 2) gene mutations? no     ----------------------------------------------------------------------------------------------------------------------------------------------------------------------------------------------------------------------  Because this visit was a virtual/telehealth visit, some criteria may be missing or patient reported. Any vitals not documented were not able to be obtained and vitals that have been documented are patient reported.    MEDICARE ANNUAL PREVENTIVE VISIT WITH PROVIDER: (Welcome to Medicare, initial annual wellness or annual wellness exam)  Virtual Visit via Video Note  I connected with VARSHA KNOCK on 05/22/23 by a video enabled telemedicine application and verified that I am speaking with the correct person using two identifiers.  Location patient: home Location provider:work or home office Persons participating in the virtual  visit: patient, provider  Concerns and/or follow up today: stable, reports nothing new. She has had some urinary and bowel incontinence on occ for over a year. Reports has seen urology in the past for this.    See HM section in Epic for other details of completed HM.    ROS: negative for report of fevers, unintentional weight loss, vision changes, vision loss, hearing loss or change, chest pain, sob, hemoptysis, melena, hematochezia, hematuria, falls, bleeding or bruising, thoughts of suicide or self harm, memory loss  Patient-completed extensive health risk assessment - reviewed and discussed with the patient: See Health Risk Assessment completed with patient prior to the visit either above or in recent phone note. This was reviewed in detailed with the patient today and appropriate recommendations, orders and referrals were placed as needed per Summary below and patient instructions.   Review of Medical History: -PMH, PSH, Family History and current specialty and care providers reviewed and updated and listed below   Patient Care Team: Panosh, Apolinar POUR, MD as PCP - General Raford Riggs, MD as PCP - Cardiology (Cardiology) Abran Norleen SAILOR, MD (Gastroenterology) Melodi Lerner, MD as Consulting Physician (Orthopedic Surgery) Duwayne Purchase, MD as Consulting Physician (Orthopedic Surgery)   Past Medical History:  Diagnosis Date   Cataract    Colon polyps    hyperplastic   Diverticulosis    Emphysema of lung (HCC)    minor scaring on top of lungs   Esophageal stricture    GERD (gastroesophageal reflux disease)    History of bronchitis    Hyperlipidemia    Hypertension    Mitral valve prolapse    Osteoarthritis    Osteoporosis    Spinal stenosis     Past Surgical History:  Procedure Laterality Date   COLONOSCOPY     NM MYOVIEW  LTD  08/2006   normal for abn ekg   TONSILLECTOMY     TOTAL KNEE ARTHROPLASTY Left 04/14/2015   Procedure: LEFT TOTAL KNEE ARTHROPLASTY;   Surgeon: Lerner Melodi, MD;  Location: [facility];  Service: Orthopedics;  Laterality: Left;    Social History   Socioeconomic History   Marital status: Married    Spouse name: Not on file   Number of children: 2   Years of education: Not on file   Highest education level: Master's degree (e.g., MA, MS, MEng, MEd, MSW, MBA)  Occupational History   Occupation: Retired  Tobacco Use   Smoking status: Former    Current packs/day: 0.00    Average packs/day: 1 pack/day for 29.0 years (29.0 ttl pk-yrs)    Types: Cigarettes    Start date: 05/08/1970    Quit date: 05/09/1999    Years since quitting: 24.0   Smokeless tobacco: Never  Vaping Use   Vaping status: Never Used  Substance and Sexual Activity   Alcohol use: Yes    Alcohol/week: 0.0 standard drinks of alcohol    Comment: OCC- GLASS OF WINE   Drug use: No   Sexual activity: Not Currently  Other Topics Concern   Not on file  Social History Narrative   Married   HH of 2 helps take care 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   Neg td neg ets   Social Drivers of Corporate Investment Banker  Strain: Low Risk  (12/13/2021)   Overall Financial Resource Strain (CARDIA)    Difficulty of Paying Living Expenses: Not hard at all  Food Insecurity: No Food Insecurity (12/13/2021)   Hunger Vital Sign    Worried About Running Out of Food in the Last Year: Never true    Ran Out of Food in the Last Year: Never true  Transportation Needs: No Transportation Needs (12/13/2021)   PRAPARE - Administrator, Civil Service (Medical): No    Lack of Transportation (Non-Medical): No  Physical Activity: Sufficiently Active (09/19/2022)   Exercise Vital Sign    Days of Exercise per Week: 4 days    Minutes of Exercise per Session: 150+ min  Stress: No Stress Concern Present (09/19/2022)   [employer] of Occupational Health  - Occupational Stress Questionnaire    Feeling of Stress : Not at all  Social Connections: Socially Integrated (09/17/2021)   Social Connection and Isolation Panel [NHANES]    Frequency of Communication with Friends and Family: Twice a week    Frequency of Social Gatherings with Friends and Family: Twice a week    Attends Religious Services: More than 4 times per year    Active Member of [employer] or Organizations: Yes    Attends Engineer, Structural: More than 4 times per year    Marital Status: Married  Catering Manager Violence: Not At Risk (12/07/2020)   Humiliation, Afraid, Rape, and Kick questionnaire    Fear of Current or Ex-Partner: No    Emotionally Abused: No    Physically Abused: No    Sexually Abused: No    Family History  Problem Relation Age of Onset   Alcohol abuse Mother    Stroke Mother    Hypertension Mother    Diabetes Mother    Hyperlipidemia Mother    Hyperlipidemia Father    Breast cancer Sister    Colon cancer Neg Hx    Colon polyps Neg Hx    Esophageal cancer Neg Hx    Rectal cancer Neg Hx    Stomach cancer Neg Hx     Current Outpatient Medications on File Prior to Visit  Medication Sig Dispense Refill   acetaminophen  (TYLENOL ) 500 MG tablet Take 500-1,000 mg by mouth every 6 (six) hours as needed for moderate pain.     Ascorbic Acid (VITAMIN C) 1000 MG tablet Take 1,000 mg by mouth daily.     Candesartan  Cilexetil-HCTZ 32-25 MG TABS Take 1 tablet by mouth daily. 90 tablet 3   cholecalciferol (VITAMIN D3) 25 MCG (1000 UNIT) tablet Take 2,000-4,000 Units by mouth.     ciprofloxacin  (CIPRO ) 500 MG tablet Take 1 tablet (500 mg total) by mouth 2 (two) times daily. 14 tablet 0   cyclobenzaprine  (FLEXERIL ) 10 MG tablet TAKE 1/2 TO 1 (ONE-HALF TO ONE) TABLET BY MOUTH THREE TIMES DAILY AS NEEDED FOR MUSCLE SPASM 30 tablet 0   diclofenac  sodium (VOLTAREN ) 1 % GEL Apply 4 g topically 4 (four) times daily. 100 g 2   ezetimibe  (ZETIA ) 10 MG tablet Take 1  tablet (10 mg total) by mouth daily. 90 tablet 3   levothyroxine  (SYNTHROID ) 50 MCG tablet Take 1/2 (one-half) tablet by mouth once daily 45 tablet 3   Omega-3 Fatty Acids (FISH OIL) 1200 MG CPDR Take 2 capsules by mouth daily.     omeprazole  (PRILOSEC) 20 MG capsule Take 1 capsule by mouth once daily 90 capsule 0   ondansetron  (ZOFRAN -ODT) 4 MG disintegrating tablet  Take 1 tablet (4 mg total) by mouth every 4 (four) hours as needed for nausea or vomiting. 20 tablet 0   rosuvastatin  (CRESTOR ) 10 MG tablet Take 0.5 tablets (5 mg total) by mouth daily. 30 tablet 3   valACYclovir  (VALTREX ) 1000 MG tablet TAKE 1 TABLET BY MOUTH THREE TIMES DAILY AS DIRECTED 30 tablet 2   Current Facility-Administered Medications on File Prior to Visit  Medication Dose Route Frequency Provider Last Rate Last Admin   0.9 %  sodium chloride  infusion  500 mL Intravenous Once Abran Norleen SAILOR, MD        Allergies  Allergen Reactions   Other Other (See Comments)    Joint pain. Patient denies being allergic to anything else.simvastatin    Lisinopril  Cough    Tolerates losartan    Oxycodone  Nausea Only and Palpitations    Change in mental status, camps, anxiety, chills   Simvastatin Other (See Comments)    Myalgias       Physical Exam Vitals requested from patient and listed below if patient had equipment and was able to obtain at home for this virtual visit: There were no vitals filed for this visit. Estimated body mass index is 26.98 kg/m as calculated from the following:   Height as of 02/01/23: 5' 1 (1.549 m).   Weight as of 02/01/23: 142 lb 12.8 oz (64.8 kg).  EKG (optional): deferred due to virtual visit  GENERAL: alert, oriented, no acute distress detected, full vision exam deferred due to pandemic and/or virtual encounter  HEENT: atraumatic, conjunttiva clear, no obvious abnormalities on inspection of external nose and ears  NECK: normal movements of the head and neck  LUNGS: on inspection no signs  of respiratory distress, breathing rate appears normal, no obvious gross SOB, gasping or wheezing  CV: no obvious cyanosis  MS: moves all visible extremities without noticeable abnormality  PSYCH/NEURO: pleasant and cooperative, no obvious depression or anxiety, speech and thought processing grossly intact, Cognitive function grossly intact  Flowsheet Row Office Visit from [facility]/22/2023 in [facility] [place]  PHQ-9 Total Score 4           05/22/2023   12:23 PM 01/22/2023    3:20 PM 08/22/2022   11:04 AM 03/28/2022    4:20 PM 02/06/2022   11:34 AM  Depression screen PHQ 2/9  Decreased Interest 0 0 0 0 0  Down, Depressed, Hopeless 0 0 0 0 0  PHQ - 2 Score 0 0 0 0 0  Altered sleeping  3 3 3    Tired, decreased energy  1 1 2    Change in appetite  0 0 0   Feeling bad or failure about yourself   0 0 0   Trouble concentrating  0 0 0   Moving slowly or fidgety/restless  0 0 0   Suicidal thoughts  0 0 0   PHQ-9 Score  4 4 5    Difficult doing work/chores  Not difficult at all Not difficult at all Not difficult at all        03/28/2022    4:21 PM 08/22/2022   11:04 AM 01/22/2023    3:20 PM 05/18/2023    3:12 PM 05/22/2023   12:23 PM  Fall Risk  Falls in the past year? 0 0 0 0 0  Was there an injury with Fall? 0 0 0  0  Fall Risk Category Calculator 0 0 0    Fall Risk Category (Retired) Low      (  RETIRED) Patient Fall Risk Level Low fall risk      Patient at Risk for Falls Due to No Fall Risks No Fall Risks No Fall Risks    Fall risk Follow up Falls evaluation completed Falls evaluation completed Falls evaluation completed  Falls evaluation completed     SUMMARY AND PLAN:  Encounter for Medicare annual wellness exam    Discussed applicable health maintenance/preventive health measures and advised and referred or ordered per patient preferences: -discussed current guidelines on updated covid vaccines  -discussed last bone density and offered to order  repeat, she prefers to hold off for now, advised her to call if/when she wants to do it. Discussed Ca, vit D and bone building exercise.  Health Maintenance  Topic Date Due   COVID-19 Vaccine (7 - 2024-25 season) 03/10/2023   MAMMOGRAM  08/14/2023   Medicare Annual Wellness (AWV)  05/21/2024   DTaP/Tdap/Td (3 - Td or Tdap) 07/03/2024   Colonoscopy  01/31/2033   Pneumonia Vaccine 7+ Years old  Completed   INFLUENZA VACCINE  Completed   DEXA SCAN  Completed   Hepatitis C Screening  Completed   Zoster Vaccines- Shingrix  Completed   HPV VACCINES  Aged [employer] and counseling on the following was provided based on the above review of health and a plan/checklist for the patient, along with additional information discussed, was provided for the patient in the patient instructions :   -Provided safe balance exercises that can be done at home to improve balance and discussed exercise guidelines for adults with include balance exercises at least 3 days per week.  -Advised and counseled on a healthy lifestyle  -Reviewed patient's current diet. Advised and counseled on a whole foods based healthy diet. A summary of a healthy diet was provided in the Patient Instructions.  -reviewed patient's current physical activity level and discussed exercise guidelines for adults. Discussed community resources and ideas for safe exercise at home to assist in meeting exercise guideline recommendations in a safe and healthy way.  -Advise yearly dental visits at minimum and regular eye exams -advised to schedule appt with Dr. Charlett to discuss the incontinence issues. Sent message to schedulers as well to assist in scheduling.    Follow up: see patient instructions     Patient Instructions  I really enjoyed getting to talk with you today! I am available on Tuesdays and Thursdays for virtual visits if you have any questions or concerns, or if I can be of any further assistance.   CHECKLIST FROM  ANNUAL WELLNESS VISIT:  -Follow up (please call to schedule if not scheduled after visit):   -call today to schedule in office visit with Dr. Charlett regarding the incontinence   -yearly for annual wellness visit with primary care office  Here is a list of your preventive care/health maintenance measures and the plan for each if any are due:  PLAN For any measures below that may be due:  -let us  know when you want to do the bone density test  Health Maintenance  Topic Date Due   COVID-19 Vaccine (7 - 2024-25 season) 03/10/2023   MAMMOGRAM  08/14/2023   Medicare Annual Wellness (AWV)  05/21/2024   DTaP/Tdap/Td (3 - Td or Tdap) 07/03/2024   Colonoscopy  01/31/2033   Pneumonia Vaccine 70+ Years old  Completed   INFLUENZA VACCINE  Completed   DEXA SCAN  Completed   Hepatitis C Screening  Completed   Zoster Vaccines- Shingrix  Completed   HPV VACCINES  Aged Out    -See a dentist at least yearly  -Get your eyes checked and then per your eye specialist's recommendations  -Other issues addressed today:   -I have included below further information regarding a healthy whole foods based diet, physical activity guidelines for adults, stress management and opportunities for social connections. I hope you find this information useful.   -----------------------------------------------------------------------------------------------------------------------------------------------------------------------------------------------------------------------------------------------------------  NUTRITION: -eat real food: lots of colorful vegetables (half the plate) and fruits -5-7 servings of vegetables and fruits per day (fresh or steamed is best), exp. 2 servings of vegetables with lunch and dinner and 2 servings of fruit per day. Berries and greens such as kale and collards are great choices.  -consume on a regular basis: whole grains (make sure first ingredient on label contains the word  whole), fresh fruits, fish, nuts, seeds, healthy oils (such as olive oil, avocado oil, grape seed oil) -may eat small amounts of dairy and lean meat on occasion, but avoid processed meats such as ham, bacon, lunch meat, etc. -drink water -try to avoid fast food and pre-packaged foods, processed meat -most experts advise limiting sodium to < 2300mg  per day, should limit further is any chronic conditions such as high blood pressure, heart disease, diabetes, etc. The American Heart Association advised that < 1500mg  is is ideal -try to avoid foods that contain any ingredients with names you do not recognize  -try to avoid sugar/sweets (except for the natural sugar that occurs in fresh fruit) -try to avoid sweet drinks -try to avoid white rice, white bread, pasta (unless whole grain), white or yellow potatoes  EXERCISE GUIDELINES FOR ADULTS: -if you wish to increase your physical activity, do so gradually and with the approval of your doctor -STOP and seek medical care immediately if you have any chest pain, chest discomfort or trouble breathing when starting or increasing exercise  -move and stretch your body, legs, feet and arms when sitting for long periods -Physical activity guidelines for optimal health in adults: -least 150 minutes per week of aerobic exercise (can talk, but not sing) once approved by your doctor, 20-30 minutes of sustained activity or two 10 minute episodes of sustained activity every day.  -resistance training at least 2 days per week if approved by your doctor -balance exercises 3+ days per week:   Stand somewhere where you have something sturdy to hold onto if you lose balance.    1) lift up on toes, start with 5x per day and work up to 20x   2) stand and lift on leg straight out to the side so that foot is a few inches of the floor, start with 5x each side and work up to 20x each side   3) stand on one foot, start with 5 seconds each side and work up to 20 seconds on  each side  If you need ideas or help with getting more active:  -Silver sneakers https://tools.silversneakers.com  -Walk with a Doc: Http://www.duncan-williams.com/  -try to include resistance (weight lifting/strength building) and balance exercises twice per week: or the following link for ideas: http://castillo-powell.com/  buyducts.dk  STRESS MANAGEMENT: -can try meditating, or just sitting quietly with deep breathing while intentionally relaxing all parts of your body for 5 minutes daily -if you need further help with stress, anxiety or depression please follow up with your primary doctor or contact the wonderful folks at [facility]: [phone]  SOCIAL CONNECTIONS: -options in [place] if you wish to engage  in more social and exercise related activities:  -Silver sneakers https://tools.silversneakers.com  -Walk with a Doc: Http://www.duncan-williams.com/  -Check out the [facility] Active Adults 50+ section on the [place] [employer] companies (hiking clubs, book clubs, cards and games, chess, exercise classes, aquatic classes and much more) - see the website for details: https://www.[place].gov/departments/parks-recreation/active-adults50  -YouTube has lots of exercise videos for different ages and abilities as well  -[facility] (a variety of indoor and outdoor inperson activities for adults). [phone]. [address].  -Virtual Online Classes (a variety of topics): see seniorplanet.org or call 5876240285  -consider volunteering at a school, hospice center, church, senior center or elsewhere           Chiquita JONELLE Cramp, DO

## 2023-06-07 DIAGNOSIS — H6693 Otitis media, unspecified, bilateral: Secondary | ICD-10-CM | POA: Diagnosis not present

## 2023-06-07 DIAGNOSIS — J019 Acute sinusitis, unspecified: Secondary | ICD-10-CM | POA: Diagnosis not present

## 2023-06-14 DIAGNOSIS — J019 Acute sinusitis, unspecified: Secondary | ICD-10-CM | POA: Diagnosis not present

## 2023-06-19 ENCOUNTER — Encounter: Payer: Self-pay | Admitting: Internal Medicine

## 2023-06-19 ENCOUNTER — Other Ambulatory Visit: Payer: Self-pay | Admitting: Internal Medicine

## 2023-06-19 ENCOUNTER — Ambulatory Visit (INDEPENDENT_AMBULATORY_CARE_PROVIDER_SITE_OTHER): Payer: HMO | Admitting: Internal Medicine

## 2023-06-19 VITALS — BP 110/58 | HR 88 | Temp 97.6°F | Ht 61.0 in | Wt 145.4 lb

## 2023-06-19 DIAGNOSIS — Z79899 Other long term (current) drug therapy: Secondary | ICD-10-CM

## 2023-06-19 DIAGNOSIS — R3915 Urgency of urination: Secondary | ICD-10-CM | POA: Diagnosis not present

## 2023-06-19 DIAGNOSIS — R739 Hyperglycemia, unspecified: Secondary | ICD-10-CM

## 2023-06-19 DIAGNOSIS — E039 Hypothyroidism, unspecified: Secondary | ICD-10-CM

## 2023-06-19 DIAGNOSIS — N319 Neuromuscular dysfunction of bladder, unspecified: Secondary | ICD-10-CM

## 2023-06-19 DIAGNOSIS — I10 Essential (primary) hypertension: Secondary | ICD-10-CM

## 2023-06-19 DIAGNOSIS — E782 Mixed hyperlipidemia: Secondary | ICD-10-CM

## 2023-06-19 NOTE — Patient Instructions (Signed)
Good to see you today Pelvic exercises and food adaptation  and Continue lifestyle intervention healthy eating and exercise .   Get appt or fasting lab before cpe appt in May 2025. Fu in interim if needed

## 2023-06-19 NOTE — Progress Notes (Signed)
Chief Complaint  Patient presents with   Urinary Incontinence    Pt reports she had a virtual medical wellness and the nurse was concern her having urinary/bowel incontinence. Was advise to follow up with PCP. Pt states sast episode was a month ago. Pt states it doesn't happen often and just had colonoscopy last Fall.     HPI: Jacqueline Myers 73 y.o. come in for urinary  these hav been on goin evaluated and had pt but with ocass  fecal urgency and incintinence not changing  but wellness nurse advise she  consult with PCP .  This is not concering to her as  not new and has had  colon screening recently and no blood   Sen uc for sinuses jan 30 and feb 6  just now getting better after  doxycycline  Uti in sept 24  Last pv 5 24  Little incontinence not  worred about this.  S coming  from  Bowel seems onset if has also if lots of seeds   "\once in a blue moon".   ? 10 years off and on.  Had ok Colon in fall.   ROS: See pertinent positives and negatives per HPI.  Past Medical History:  Diagnosis Date   Cataract    Colon polyps    hyperplastic   Diverticulosis    Emphysema of lung (HCC)    minor scaring on top of lungs   Esophageal stricture    GERD (gastroesophageal reflux disease)    History of bronchitis    Hyperlipidemia    Hypertension    Mitral valve prolapse    Osteoarthritis    Osteoporosis    Spinal stenosis     Family History  Problem Relation Age of Onset   Alcohol abuse Mother    Stroke Mother    Hypertension Mother    Diabetes Mother    Hyperlipidemia Mother    Hyperlipidemia Father    Breast cancer Sister    Colon cancer Neg Hx    Colon polyps Neg Hx    Esophageal cancer Neg Hx    Rectal cancer Neg Hx    Stomach cancer Neg Hx     Social History   Socioeconomic History   Marital status: Married    Spouse name: Not on file   Number of children: 2   Years of education: Not on file   Highest education level: Master's degree (e.g., MA, MS, MEng,  MEd, MSW, MBA)  Occupational History   Occupation: Retired  Tobacco Use   Smoking status: Former    Current packs/day: 0.00    Average packs/day: 1 pack/day for 29.0 years (29.0 ttl pk-yrs)    Types: Cigarettes    Start date: 05/08/1970    Quit date: 05/09/1999    Years since quitting: 24.1   Smokeless tobacco: Never  Vaping Use   Vaping status: Never Used  Substance and Sexual Activity   Alcohol use: Yes    Alcohol/week: 0.0 standard drinks of alcohol    Comment: OCC- GLASS OF WINE   Drug use: No   Sexual activity: Not Currently  Other Topics Concern   Not on file  Social History Narrative   Married   HH of 2 helps take care 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   Neg td neg ets   Social Drivers of  Health   Financial Resource Strain: Low Risk  (06/15/2023)   Overall Financial Resource Strain (CARDIA)    Difficulty of Paying Living Expenses: Not very hard  Food Insecurity: No Food Insecurity (06/15/2023)   Hunger Vital Sign    Worried About Running Out of Food in the Last Year: Never true    Ran Out of Food in the Last Year: Never true  Transportation Needs: No Transportation Needs (06/15/2023)   PRAPARE - Administrator, Civil Service (Medical): No    Lack of Transportation (Non-Medical): No  Physical Activity: Sufficiently Active (06/15/2023)   Exercise Vital Sign    Days of Exercise per Week: 5 days    Minutes of Exercise per Session: 30 min  Stress: No Stress Concern Present (06/15/2023)   [employer] of Occupational Health - Occupational Stress Questionnaire    Feeling of Stress : Not at all  Social Connections: Socially Integrated (06/15/2023)   Social Connection and Isolation Panel [NHANES]    Frequency of Communication with Friends and Family: Three times a week    Frequency of Social Gatherings with Friends and Family: Patient  declined    Attends Religious Services: More than 4 times per year    Active Member of Clubs or Organizations: Yes    Attends Engineer, structural: More than 4 times per year    Marital Status: Married    Outpatient Medications Prior to Visit  Medication Sig Dispense Refill   acetaminophen (TYLENOL) 500 MG tablet Take 500-1,000 mg by mouth every 6 (six) hours as needed for moderate pain.     Candesartan Cilexetil-HCTZ 32-25 MG TABS Take 1 tablet by mouth daily. 90 tablet 3   cholecalciferol (VITAMIN D3) 25 MCG (1000 UNIT) tablet Take 2,000-4,000 Units by mouth.     cyclobenzaprine (FLEXERIL) 10 MG tablet TAKE 1/2 TO 1 (ONE-HALF TO ONE) TABLET BY MOUTH THREE TIMES DAILY AS NEEDED FOR MUSCLE SPASM 30 tablet 0   diclofenac sodium (VOLTAREN) 1 % GEL Apply 4 g topically 4 (four) times daily. 100 g 2   levothyroxine (SYNTHROID) 50 MCG tablet Take 1/2 (one-half) tablet by mouth once daily 45 tablet 3   Magnesium 250 MG CAPS daily.     Omega-3 Fatty Acids (FISH OIL) 1200 MG CPDR Take 2 capsules by mouth daily.     omeprazole (PRILOSEC) 20 MG capsule Take 1 capsule by mouth once daily 90 capsule 0   rosuvastatin (CRESTOR) 10 MG tablet Take 0.5 tablets (5 mg total) by mouth daily. 30 tablet 3   valACYclovir (VALTREX) 1000 MG tablet TAKE 1 TABLET BY MOUTH THREE TIMES DAILY AS DIRECTED 30 tablet 2   ezetimibe (ZETIA) 10 MG tablet Take 1 tablet (10 mg total) by mouth daily. 90 tablet 3   ondansetron (ZOFRAN-ODT) 4 MG disintegrating tablet Take 1 tablet (4 mg total) by mouth every 4 (four) hours as needed for nausea or vomiting. (Patient not taking: Reported on 06/19/2023) 20 tablet 0   Ascorbic Acid (VITAMIN C) 1000 MG tablet Take 1,000 mg by mouth daily.     ciprofloxacin (CIPRO) 500 MG tablet Take 1 tablet (500 mg total) by mouth 2 (two) times daily. 14 tablet 0   Facility-Administered Medications Prior to Visit  Medication Dose Route Frequency Provider Last Rate Last Admin   0.9 %  sodium  chloride infusion  500 mL Intravenous Once Hilarie Fredrickson, MD         EXAM:  BP (!) 110/58 (BP Location: Left  Arm, Patient Position: Sitting, Cuff Size: Large)   Pulse 88   Temp 97.6 F (36.4 C) (Oral)   Ht 5\' 1"  (1.549 m)   Wt 145 lb 6.4 oz (66 kg)   SpO2 98%   BMI 27.47 kg/m   Body mass index is 27.47 kg/m.  GENERAL: vitals reviewed and listed above, alert, oriented, appears well hydrated and in no acute distress HEENT: atraumatic, conjunctiva  clear, no obvious abnormalities on inspection of external nose and ears masked  NECK: no obvious masses on inspection palpation  LUNGS: clear to auscultation bilaterally, no wheezes, rales or rhonchi, good air movement CV: HRRR, no clubbing cyanosis or  peripheral edema nl cap refill  MS: moves all extremities without noticeable focal  abnormality PSYCH: pleasant and cooperative, no obvious depression or anxiety Lab Results  Component Value Date   WBC 7.9 09/19/2022   HGB 13.5 09/19/2022   HCT 39.6 09/19/2022   PLT 286.0 09/19/2022   GLUCOSE 89 09/19/2022   CHOL 111 01/09/2023   TRIG 129 01/09/2023   HDL 44 01/09/2023   LDLDIRECT 127.6 06/23/2013   LDLCALC 44 01/09/2023   ALT 24 01/09/2023   AST 25 01/09/2023   NA 139 09/19/2022   K 3.7 09/19/2022   CL 100 09/19/2022   CREATININE 0.90 09/19/2022   BUN 27 (H) 09/19/2022   CO2 27 09/19/2022   TSH 2.43 09/19/2022   INR 0.93 04/08/2015   HGBA1C 6.2 09/19/2022   BP Readings from Last 3 Encounters:  06/19/23 (!) 110/58  02/01/23 (!) 86/32  01/22/23 134/68  Last vitamin D Lab Results  Component Value Date   VD25OH 44.61 09/21/2021    Manufactor change  ASSESSMENT AND PLAN:  Discussed the following assessment and plan:  URINARY URGENCY - with ocass fecal ugency inc see text  Medication management Doesn't not think she has uti today just here to discuss concerns of wellness provider  However  update future labs and cpe in [place]  thyroid med manufacturer has changed   Can fu any sx at next visit cpe  in May -Patient advised to return or notify health care team  if  new concerns arise.  Patient Instructions  Good to see you today Pelvic exercises and food adaptation  and Continue lifestyle intervention healthy eating and exercise .   Get appt or fasting lab before cpe appt in May 2025. Fu in interim if needed    Neta Mends. Chaundra Abreu M.D.

## 2023-07-10 ENCOUNTER — Other Ambulatory Visit: Payer: Self-pay | Admitting: Internal Medicine

## 2023-07-10 DIAGNOSIS — Z0000 Encounter for general adult medical examination without abnormal findings: Secondary | ICD-10-CM

## 2023-08-20 DIAGNOSIS — Z1231 Encounter for screening mammogram for malignant neoplasm of breast: Secondary | ICD-10-CM | POA: Diagnosis not present

## 2023-08-20 LAB — HM MAMMOGRAPHY

## 2023-08-25 DIAGNOSIS — H6503 Acute serous otitis media, bilateral: Secondary | ICD-10-CM | POA: Diagnosis not present

## 2023-08-31 ENCOUNTER — Other Ambulatory Visit: Payer: Self-pay | Admitting: Internal Medicine

## 2023-08-31 DIAGNOSIS — Z0000 Encounter for general adult medical examination without abnormal findings: Secondary | ICD-10-CM

## 2023-09-06 ENCOUNTER — Other Ambulatory Visit: Payer: Self-pay | Admitting: Internal Medicine

## 2023-09-12 ENCOUNTER — Other Ambulatory Visit (INDEPENDENT_AMBULATORY_CARE_PROVIDER_SITE_OTHER): Payer: HMO

## 2023-09-12 DIAGNOSIS — Z79899 Other long term (current) drug therapy: Secondary | ICD-10-CM | POA: Diagnosis not present

## 2023-09-12 DIAGNOSIS — N319 Neuromuscular dysfunction of bladder, unspecified: Secondary | ICD-10-CM

## 2023-09-12 DIAGNOSIS — I10 Essential (primary) hypertension: Secondary | ICD-10-CM | POA: Diagnosis not present

## 2023-09-12 DIAGNOSIS — E039 Hypothyroidism, unspecified: Secondary | ICD-10-CM

## 2023-09-12 DIAGNOSIS — R739 Hyperglycemia, unspecified: Secondary | ICD-10-CM

## 2023-09-12 DIAGNOSIS — E782 Mixed hyperlipidemia: Secondary | ICD-10-CM

## 2023-09-12 LAB — BASIC METABOLIC PANEL WITH GFR
BUN: 25 mg/dL — ABNORMAL HIGH (ref 6–23)
CO2: 28 meq/L (ref 19–32)
Calcium: 9.2 mg/dL (ref 8.4–10.5)
Chloride: 101 meq/L (ref 96–112)
Creatinine, Ser: 0.84 mg/dL (ref 0.40–1.20)
GFR: 68.76 mL/min (ref >60.00)
Glucose, Bld: 104 mg/dL — ABNORMAL HIGH (ref 70–99)
Potassium: 4.5 meq/L (ref 3.5–5.1)
Sodium: 137 meq/L (ref 135–145)

## 2023-09-12 LAB — CBC WITH DIFFERENTIAL/PLATELET
Basophils Absolute: 0 10*3/uL (ref 0.0–0.1)
Basophils Relative: 0.4 % (ref 0.0–3.0)
Eosinophils Absolute: 0.2 10*3/uL (ref 0.0–0.7)
Eosinophils Relative: 3.2 % (ref 0.0–5.0)
HCT: 41.6 % (ref 36.0–46.0)
Hemoglobin: 13.7 g/dL (ref 12.0–15.0)
Lymphocytes Relative: 28.3 % (ref 12.0–46.0)
Lymphs Abs: 2 10*3/uL (ref 0.7–4.0)
MCHC: 33 g/dL (ref 30.0–36.0)
MCV: 90.8 fl (ref 78.0–100.0)
Monocytes Absolute: 0.6 10*3/uL (ref 0.1–1.0)
Monocytes Relative: 7.7 % (ref 3.0–12.0)
Neutro Abs: 4.4 10*3/uL (ref 1.4–7.7)
Neutrophils Relative %: 60.4 % (ref 43.0–77.0)
Platelets: 289 10*3/uL (ref 150.0–400.0)
RBC: 4.59 Mil/uL (ref 3.87–5.11)
RDW: 14 % (ref 11.5–15.5)
WBC: 7.2 10*3/uL (ref 4.0–10.5)

## 2023-09-12 LAB — HEPATIC FUNCTION PANEL
ALT: 20 U/L (ref 0–35)
AST: 18 U/L (ref 0–37)
Albumin: 4.4 g/dL (ref 3.5–5.2)
Alkaline Phosphatase: 74 U/L (ref 39–117)
Bilirubin, Direct: 0 mg/dL (ref 0.0–0.3)
Total Bilirubin: 0.3 mg/dL (ref 0.2–1.2)
Total Protein: 6.9 g/dL (ref 6.0–8.3)

## 2023-09-12 LAB — LIPID PANEL
Cholesterol: 133 mg/dL (ref 0–200)
HDL: 50.3 mg/dL (ref >39.00)
LDL Cholesterol: 54 mg/dL (ref 0–99)
NonHDL: 82.75
Total CHOL/HDL Ratio: 3
Triglycerides: 143 mg/dL (ref 0.0–149.0)
VLDL: 28.6 mg/dL (ref 0.0–40.0)

## 2023-09-12 LAB — MICROALBUMIN / CREATININE URINE RATIO
Creatinine,U: 89.2 mg/dL
Microalb Creat Ratio: 11.9 mg/g (ref 0.0–30.0)
Microalb, Ur: 1.1 mg/dL (ref 0.0–1.9)

## 2023-09-12 LAB — HEMOGLOBIN A1C: Hgb A1c MFr Bld: 6.1 % (ref 4.6–6.5)

## 2023-09-12 LAB — TSH: TSH: 3.72 u[IU]/mL (ref 0.35–5.50)

## 2023-09-13 ENCOUNTER — Encounter (HOSPITAL_COMMUNITY): Payer: Self-pay

## 2023-09-13 ENCOUNTER — Encounter: Payer: Self-pay | Admitting: Internal Medicine

## 2023-09-13 NOTE — Progress Notes (Signed)
 Results in range  or  stable  will review at upcoming visit

## 2023-09-19 ENCOUNTER — Encounter: Payer: Self-pay | Admitting: Internal Medicine

## 2023-09-19 NOTE — Progress Notes (Signed)
 Chief Complaint  Patient presents with   Annual Exam    HPI: Patient  Jacqueline Myers  73 y.o. comes in today for Preventive Health Care visit   HT controlled  denies se of meds HLD: zetia  now and low dose crestor   Sleep much better  stopped otcs and using Ouva ring  Knees activity initing but very active  Working on diet cut out sugars . To review  labs  Health Maintenance  Topic Date Due   COVID-19 Vaccine (7 - Pfizer risk 2024-25 season) 07/13/2023   INFLUENZA VACCINE  12/07/2023   Medicare Annual Wellness (AWV)  05/21/2024   DTaP/Tdap/Td (3 - Td or Tdap) 07/03/2024   MAMMOGRAM  08/19/2024   Colonoscopy  01/31/2033   Pneumonia Vaccine 63+ Years old  Completed   DEXA SCAN  Completed   Hepatitis C Screening  Completed   Zoster Vaccines- Shingrix  Completed   HPV VACCINES  Aged Out   Meningococcal B Vaccine  Aged Out   Health Maintenance Review LIFESTYLE:  Exercise:   a lot" chair yoga   outside work  Tobacco/ETS:n Alcohol: rare Sugar beverages: n Sleep: doing great 7-8 hours  using aura  Drug use: no HH of  2     ROS:  GEN/ HEENT: No fever, significant weight changes sweats headaches vision problems hearing changes, CV/ PULM; No chest pain shortness of breath cough, syncope,edema  change in exercise tolerance. GI /GU: No adominal pain, vomiting, change in bowel habits. No blood in the stool. No significant GU symptoms. SKIN/HEME: ,no acute skin rashes suspicious lesions or bleeding. No lymphadenopathy, nodules, masses.  NEURO/ PSYCH:  No neurologic signs such as weakness numbness. No depression anxiety. IMM/ Allergy: No unusual infections.  Allergy .   REST of 12 system review negative except as per HPI   Past Medical History:  Diagnosis Date   Cataract    Colon polyps    hyperplastic   Diverticulosis    Emphysema of lung (HCC)    minor scaring on top of lungs   Esophageal stricture    GERD (gastroesophageal reflux disease)    History of  bronchitis    Hyperlipidemia    Hypertension    Mitral valve prolapse    Osteoarthritis    Osteoporosis    Spinal stenosis    Stress due to illness of family member 07/05/2014   mom is doidng better but stress of total caretaking cooking etc       Past Surgical History:  Procedure Laterality Date   COLONOSCOPY     NM MYOVIEW  LTD  08/2006   normal for abn ekg   TONSILLECTOMY     TOTAL KNEE ARTHROPLASTY Left 04/14/2015   Procedure: LEFT TOTAL KNEE ARTHROPLASTY;  Surgeon: Liliane Rei, MD;  Location: [facility];  Service: Orthopedics;  Laterality: Left;    Family History  Problem Relation Age of Onset   Alcohol abuse Mother    Stroke Mother    Hypertension Mother    Diabetes Mother    Hyperlipidemia Mother    Hyperlipidemia Father    Breast cancer Sister    Colon cancer Neg Hx    Colon polyps Neg Hx    Esophageal cancer Neg Hx    Rectal cancer Neg Hx    Stomach cancer Neg Hx     Social History   Socioeconomic History   Marital status: Married    Spouse name: Not on file   Number of children: 2   Years  of education: Not on file   Highest education level: Master's degree (e.g., MA, MS, MEng, MEd, MSW, MBA)  Occupational History   Occupation: Retired  Tobacco Use   Smoking status: Former    Current packs/day: 0.00    Average packs/day: 1 pack/day for 29.0 years (29.0 ttl pk-yrs)    Types: Cigarettes    Start date: 05/08/1970    Quit date: 05/09/1999    Years since quitting: 24.3   Smokeless tobacco: Never  Vaping Use   Vaping status: Never Used  Substance and Sexual Activity   Alcohol use: Yes    Alcohol/week: 0.0 standard drinks of alcohol    Comment: OCC- GLASS OF WINE   Drug use: No   Sexual activity: Not Currently  Other Topics Concern   Not on file  Social History Narrative   Married   HH of 2 helps take care 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   Neg td neg ets   Social Drivers of Corporate investment banker Strain: Low Risk  (06/15/2023)   Overall Financial Resource Strain (CARDIA)    Difficulty of Paying Living Expenses: Not very hard  Food Insecurity: No Food Insecurity (06/15/2023)   Hunger Vital Sign    Worried About Running Out of Food in the Last Year: Never true    Ran Out of Food in the Last Year: Never true  Transportation Needs: No Transportation Needs (06/15/2023)   PRAPARE - Administrator, Civil Service (Medical): No    Lack of Transportation (Non-Medical): No  Physical Activity: Sufficiently Active (06/15/2023)   Exercise Vital Sign    Days of Exercise per Week: 5 days    Minutes of Exercise per Session: 30 min  Stress: No Stress Concern Present (06/15/2023)   [employer] of Occupational Health - Occupational Stress Questionnaire    Feeling of Stress : Not at all  Social Connections: Socially Integrated (06/15/2023)   Social Connection and Isolation Panel [NHANES]    Frequency of Communication with Friends and Family: Three times a week    Frequency of Social Gatherings with Friends and Family: Patient declined    Attends Religious Services: More than 4 times per year    Active Member of Clubs or Organizations: Yes    Attends Engineer, structural: More than 4 times per year    Marital Status: Married    Outpatient Medications Prior to Visit  Medication Sig Dispense Refill   acetaminophen  (TYLENOL ) 500 MG tablet Take 500-1,000 mg by mouth every 6 (six) hours as needed for moderate pain.     Candesartan  Cilexetil-HCTZ 32-25 MG TABS Take 1 tablet by mouth once daily 90 tablet 0   cholecalciferol (VITAMIN D3) 25 MCG (1000 UNIT) tablet Take 2,000-4,000 Units by mouth.     cyclobenzaprine  (FLEXERIL ) 10 MG tablet TAKE 1/2 TO 1 (ONE-HALF TO ONE) TABLET BY MOUTH THREE TIMES DAILY AS NEEDED FOR MUSCLE SPASM 30 tablet 0   diclofenac  sodium (VOLTAREN ) 1 % GEL Apply 4 g topically 4 (four)  times daily. 100 g 2   levothyroxine  (SYNTHROID ) 50 MCG tablet Take 1/2 (one-half) tablet by mouth once daily 45 tablet 3   Magnesium 250 MG CAPS daily.     Omega-3 Fatty Acids (FISH OIL) 1200 MG CPDR Take 2 capsules by mouth daily.     omeprazole  (PRILOSEC)  20 MG capsule Take 1 capsule by mouth once daily 90 capsule 0   ondansetron  (ZOFRAN -ODT) 4 MG disintegrating tablet Take 1 tablet (4 mg total) by mouth every 4 (four) hours as needed for nausea or vomiting. 20 tablet 0   rosuvastatin  (CRESTOR ) 10 MG tablet Take 0.5 tablets (5 mg total) by mouth daily. 30 tablet 3   rosuvastatin  (CRESTOR ) 5 MG tablet Take 1 tablet by mouth once daily 90 tablet 3   valACYclovir  (VALTREX ) 1000 MG tablet TAKE 1 TABLET BY MOUTH THREE TIMES DAILY AS DIRECTED 30 tablet 0   ezetimibe  (ZETIA ) 10 MG tablet Take 1 tablet (10 mg total) by mouth daily. 90 tablet 3   Facility-Administered Medications Prior to Visit  Medication Dose Route Frequency Provider Last Rate Last Admin   0.9 %  sodium chloride  infusion  500 mL Intravenous Once Tobin Forts, MD         EXAM:  BP 126/60 (BP Location: Left Arm, Patient Position: Sitting, Cuff Size: Normal)   Pulse 72   Temp 98.3 F (36.8 C) (Oral)   Ht 5\' 3"  (1.6 m)   Wt 144 lb 6.4 oz (65.5 kg)   SpO2 95%   BMI 25.58 kg/m   Body mass index is 25.58 kg/m. Wt Readings from Last 3 Encounters:  09/20/23 144 lb 6.4 oz (65.5 kg)  06/19/23 145 lb 6.4 oz (66 kg)  02/01/23 142 lb 12.8 oz (64.8 kg)    Physical Exam: Vital signs reviewed WUJ:WJXB is a well-developed well-nourished alert cooperative    who appearsr stated age in no acute distress.  HEENT: normocephalic atraumatic , Eyes: PERRL EOM's full, conjunctiva clear, Nares: paten,t no deformity discharge or tenderness., Ears: no deformity EAC's clear TMs with normal landmarks. Mouth: clear OP, no lesions, edema.  Moist mucous membranes. Dentition in adequate repair. NECK: supple without masses, thyromegaly or  bruits. CHEST/PULM:  Clear to auscultation and percussion breath sounds equal no wheeze , rales or rhonchi. No chest wall deformities or tenderness. Breast: normal by inspection . No dimpling, discharge, masses, tenderness or discharge . CV: PMI is nondisplaced, S1 S2 no gallops, murmurs, rubs. Peripheral pulses present without delay.No JVD .  ABDOMEN: Bowel sounds normal nontender  No guard or rebound, no hepato splenomegal no CVA tenderness.   Extremtities:  No clubbing cyanosis or edema, no acute joint swelling or redness no focal atrophy NEURO:  Oriented x3, cranial nerves 3-12 appear to be intact, no obvious focal weakness,gait within normal limits no abnormal reflexes or asymmetrical SKIN: No acute rashes normal turgor, color, no bruising or petechiae. PSYCH: Oriented, good eye contact, no obvious depression anxiety, cognition and judgment appear normal. LN: no cervical axillary adenopathy  Lab Results  Component Value Date   WBC 7.2 09/12/2023   HGB 13.7 09/12/2023   HCT 41.6 09/12/2023   PLT 289.0 09/12/2023   GLUCOSE 104 (H) 09/12/2023   CHOL 133 09/12/2023   TRIG 143.0 09/12/2023   HDL 50.30 09/12/2023   LDLDIRECT 127.6 06/23/2013   LDLCALC 54 09/12/2023   ALT 20 09/12/2023   AST 18 09/12/2023   NA 137 09/12/2023   K 4.5 09/12/2023   CL 101 09/12/2023   CREATININE 0.84 09/12/2023   BUN 25 (H) 09/12/2023   CO2 28 09/12/2023   TSH 3.72 09/12/2023   INR 0.93 04/08/2015   HGBA1C 6.1 09/12/2023   MICROALBUR 1.1 09/12/2023    BP Readings from Last 3 Encounters:  09/20/23 126/60  06/19/23 (!) 110/58  02/01/23 Aaron Aas)  86/32    Lab results reviewed with patient   ASSESSMENT AND PLAN:  Discussed the following assessment and plan:    ICD-10-CM   1. Visit for preventive health examination  Z00.00     2. Medication management  Z79.899     3. Essential hypertension  I10    controlled    4. Elevated BUN  R79.9    w nl calc gfr cr will fu after discussion    5. Mixed  hyperlipidemia  E78.2    ldl at goal    6. Hyperglycemia  R73.9    fam hx dm and  elevated tg  follow lsi for now disc  on low dose rosuva    Disc interventions  and diet  continue efforts  Knee limiting exercise but doing well Sleeping better off meds and other.   Follow bg  Do not think the bun is sig  for now will follow and disc  her ?s about ckd  Continue low dose crestor  and zetia   Return in about 6 months (around 03/22/2024) for and lab appt from todays visit .  Patient Care Team: Michiel Sivley, Joaquim Muir, MD as PCP - General Maudine Sos, MD as PCP - Cardiology (Cardiology) Tobin Forts, MD (Gastroenterology) Liliane Rei, MD as Consulting Physician (Orthopedic Surgery) Orvan Blanch, MD as Consulting Physician (Orthopedic Surgery) Patient Instructions  Good to see  you today . To be sure   we can do fu labs in another month  hydrated.   Get a lab appt  in about a month  to fu and be sure  be hydrated  ( cystatin c an gfr)   If ok then we can recheck in 6 mos with A1c test in office   Edu On K. Shakirra Buehler M.D.

## 2023-09-20 ENCOUNTER — Ambulatory Visit: Payer: HMO | Admitting: Internal Medicine

## 2023-09-20 ENCOUNTER — Other Ambulatory Visit: Payer: Self-pay | Admitting: Internal Medicine

## 2023-09-20 ENCOUNTER — Encounter: Payer: Self-pay | Admitting: Internal Medicine

## 2023-09-20 VITALS — BP 126/60 | HR 72 | Temp 98.3°F | Ht 63.0 in | Wt 144.4 lb

## 2023-09-20 DIAGNOSIS — I10 Essential (primary) hypertension: Secondary | ICD-10-CM

## 2023-09-20 DIAGNOSIS — Z79899 Other long term (current) drug therapy: Secondary | ICD-10-CM

## 2023-09-20 DIAGNOSIS — E782 Mixed hyperlipidemia: Secondary | ICD-10-CM | POA: Diagnosis not present

## 2023-09-20 DIAGNOSIS — R799 Abnormal finding of blood chemistry, unspecified: Secondary | ICD-10-CM

## 2023-09-20 DIAGNOSIS — R739 Hyperglycemia, unspecified: Secondary | ICD-10-CM

## 2023-09-20 DIAGNOSIS — Z0000 Encounter for general adult medical examination without abnormal findings: Secondary | ICD-10-CM

## 2023-09-20 NOTE — Patient Instructions (Addendum)
 Good to see  you today . To be sure   we can do fu labs in another month  hydrated.   Get a lab appt  in about a month  to fu and be sure  be hydrated  ( cystatin c an gfr)   If ok then we can recheck in 6 mos with A1c test in office

## 2023-09-20 NOTE — Progress Notes (Signed)
 Futuer labs for gfr bun eval  from 5 25

## 2023-09-24 ENCOUNTER — Ambulatory Visit: Payer: Self-pay | Admitting: Internal Medicine

## 2023-09-24 ENCOUNTER — Encounter: Payer: Self-pay | Admitting: Family Medicine

## 2023-09-24 ENCOUNTER — Ambulatory Visit: Admitting: Family Medicine

## 2023-09-24 VITALS — BP 120/60 | HR 100 | Resp 16 | Ht 63.0 in | Wt 143.0 lb

## 2023-09-24 DIAGNOSIS — N390 Urinary tract infection, site not specified: Secondary | ICD-10-CM | POA: Diagnosis not present

## 2023-09-24 DIAGNOSIS — R300 Dysuria: Secondary | ICD-10-CM | POA: Diagnosis not present

## 2023-09-24 DIAGNOSIS — R1030 Lower abdominal pain, unspecified: Secondary | ICD-10-CM | POA: Diagnosis not present

## 2023-09-24 LAB — POC URINALSYSI DIPSTICK (AUTOMATED)
Bilirubin, UA: POSITIVE
Blood, UA: NEGATIVE
Glucose, UA: NEGATIVE
Ketones, UA: POSITIVE
Nitrite, UA: POSITIVE
Protein, UA: POSITIVE — AB
Spec Grav, UA: 1.02 (ref 1.010–1.025)
Urobilinogen, UA: 1 U/dL
pH, UA: 5.5 (ref 5.0–8.0)

## 2023-09-24 MED ORDER — SULFAMETHOXAZOLE-TRIMETHOPRIM 800-160 MG PO TABS: 1.0000 | ORAL_TABLET | Freq: Two times a day (BID) | ORAL | 0 refills | Status: AC

## 2023-09-24 NOTE — Telephone Encounter (Signed)
 Chief Complaint: Burning with urination x2 days  Symptoms: Lower back pain, urgency to urinate, nauseous, flushing sensation all over body Pertinent Negatives: Patient denies fever, blood in urine  Disposition: [x] Appointment (In office)  Additional Notes: Patient scheduled for an appointment today in office. This RN educated pt on new-worsening symptoms and when to call back/seek emergent care. Pt verbalized understanding and agrees to plan.     Copied from CRM [id number]. Topic: Clinical - Red Word Triage >> Sep 24, 2023 12:27 PM Alysia Jumbo S wrote: [facility] that prompted transfer to Nurse Triage: burning with urination Reason for Disposition  Side (flank) or lower back pain present  Answer Assessment - Initial Assessment Questions SEVERITY: "How bad is the pain?"  (e.g., Scale 1-10; mild, moderate, or severe)   - MILD (1-3): complains slightly about urination hurting   - MODERATE (4-7): interferes with normal activities     - SEVERE (8-10): excruciating, unwilling or unable to urinate because of the pain  PATTERN: "Is pain present every time you urinate or just sometimes?"      Every time ONSET: "When did the painful urination start?"      Two days ago FEVER: "Do you have a fever?" If Yes, ask: "What is your temperature, how was it measured, and when did it start?"     No  Protocols used: Urination Pain - [facility]

## 2023-09-24 NOTE — Patient Instructions (Addendum)
 A few things to remember from today's visit:  Dysuria - Plan: POCT Urinalysis Dipstick (Automated)  Lower abdominal pain  Urinary tract infection without hematuria, site unspecified - Plan: sulfamethoxazole -trimethoprim  (BACTRIM  DS) 800-160 MG tablet  Adequate fluid intake, avoid holding urine for long hours, and over the counter Vit C OR cranberry capsules might help.  Today we will treat empirically with antibiotic, which we might need to change when urine culture comes back depending of bacteria susceptibility. Antibiotic could aggravate diarrhea, so recommend starting a daily probiotic. Align 1 caps daily. Monitor for new symptoms.  Seek immediate medical attention if severe abdominal pain, vomiting, fever/chills, or worsening symptoms. F/U if symptomatic are not any better after 2-3 days of antibiotic treatment.  Do not use My Chart to request refills or for acute issues that need immediate attention. If you send a my chart message, it may take a few days to be addressed, specially if I am not in the office.  Please be sure medication list is accurate. If a new problem present, please set up appointment sooner than planned today.

## 2023-09-24 NOTE — Progress Notes (Signed)
 ACUTE VISIT Chief Complaint  Patient presents with   Urinary Tract Infection    Started yesterday; dysuria, pain in lower back.    HPI: Jacqueline Myers is a 73 y.o. female with PMHx significant for voiding dysfunction/urinary urgency, spinal stenosis, hypertension, and hyperlipidemia here today complaining of urinary urgency, frequency, lower abdominal pain, dysuria, lower back pain, and a "flushing" sensation while voiding starting yesterday. Lower abdominal pain and lower back pain are worsening.   Urinary Tract Infection  This is a new problem. The current episode started yesterday. The problem has been gradually worsening. The quality of the pain is described as burning. The pain is moderate. There has been no fever. She is Not sexually active. There is No history of pyelonephritis. Pertinent negatives include no chills, discharge, flank pain, hematuria, hesitancy, nausea or vomiting. Her past medical history is significant for recurrent UTIs. There is no history of kidney stones.   She notes she has a hx of chronic back pain, but states it is not similar to current pain.  Has taken AZO to manage her urinary urgency and frequency. . Treated for UTI last in 01/2023.  Component Ref Range & Units (hover) 8 mo ago  MICRO NUMBER: [id number]  SPECIMEN QUALITY: Adequate  Sample Source URINE  STATUS: FINAL  ISOLATE 1: Escherichia coli Abnormal   Comment: 10,000-49,000 CFU/mL of Escherichia coli        Susceptibility   Escherichia coli    URINE CULTURE, REFLEX    AMOX/CLAVULANIC 16 Intermediate    AMPICILLIN >=32 Resistant    AMPICILLIN/SULBACTAM >=32 Resistant    CEFAZOLIN  <=4 Not Reportable 1    CEFEPIME <=1 Sensitive    CEFTAZIDIME <=1 Sensitive    CEFTRIAXONE <=1 Sensitive    CIPROFLOXACIN  <=0.25 Sensitive    GENTAMICIN <=1 Sensitive    IMIPENEM <=0.25 Sensitive    LEVOFLOXACIN <=0.12 Sensitive    NITROFURANTOIN  <=16 Sensitive    PIP/TAZO <=4 Sensitive     TOBRAMYCIN <=1 Sensitive    TRIMETH /SULFA  <=20 Sensitive 2             Denies changes in bowel movements since symptoms started, she mentions that she has had diarrhea for 6 weeks and following with PCP. Last bowel movement was yesterday.  Abdominal/pelvic CT in 07/2021 for abdominal pain: 1. No acute CT findings of the abdomen or pelvis to explain abdominal pain. 2. Descending and sigmoid diverticulosis without evidence of acute diverticulitis. 3. Small hiatal hernia.   Review of Systems  Constitutional:  Negative for appetite change, chills and fever.  Respiratory:  Negative for shortness of breath.   Cardiovascular:  Negative for leg swelling.  Gastrointestinal:  Positive for abdominal pain. Negative for nausea and vomiting.  Genitourinary:  Negative for flank pain, hematuria, hesitancy, vaginal bleeding and vaginal discharge.  Musculoskeletal:  Positive for back pain. Negative for gait problem.  Skin:  Negative for rash.  Neurological:  Negative for syncope and weakness.  See other pertinent positives and negatives in HPI.  Current Outpatient Medications on File Prior to Visit  Medication Sig Dispense Refill   acetaminophen  (TYLENOL ) 500 MG tablet Take 500-1,000 mg by mouth every 6 (six) hours as needed for moderate pain.     Candesartan  Cilexetil-HCTZ 32-25 MG TABS Take 1 tablet by mouth once daily 90 tablet 0   cholecalciferol (VITAMIN D3) 25 MCG (1000 UNIT) tablet Take 2,000-4,000 Units by mouth.     cyclobenzaprine  (FLEXERIL ) 10 MG tablet TAKE 1/2 TO 1 (ONE-HALF TO ONE)  TABLET BY MOUTH THREE TIMES DAILY AS NEEDED FOR MUSCLE SPASM 30 tablet 0   diclofenac  sodium (VOLTAREN ) 1 % GEL Apply 4 g topically 4 (four) times daily. 100 g 2   ezetimibe  (ZETIA ) 10 MG tablet Take 1 tablet (10 mg total) by mouth daily. 90 tablet 3   levothyroxine  (SYNTHROID ) 50 MCG tablet Take 1/2 (one-half) tablet by mouth once daily 45 tablet 3   Magnesium 250 MG CAPS daily.     Omega-3 Fatty Acids (FISH  OIL) 1200 MG CPDR Take 2 capsules by mouth daily.     omeprazole  (PRILOSEC) 20 MG capsule Take 1 capsule by mouth once daily 90 capsule 0   ondansetron  (ZOFRAN -ODT) 4 MG disintegrating tablet Take 1 tablet (4 mg total) by mouth every 4 (four) hours as needed for nausea or vomiting. 20 tablet 0   rosuvastatin  (CRESTOR ) 10 MG tablet Take 0.5 tablets (5 mg total) by mouth daily. 30 tablet 3   rosuvastatin  (CRESTOR ) 5 MG tablet Take 1 tablet by mouth once daily 90 tablet 3   valACYclovir  (VALTREX ) 1000 MG tablet TAKE 1 TABLET BY MOUTH THREE TIMES DAILY AS DIRECTED 30 tablet 0   Current Facility-Administered Medications on File Prior to Visit  Medication Dose Route Frequency Provider Last Rate Last Admin   0.9 %  sodium chloride  infusion  500 mL Intravenous Once Tobin Forts, MD        Past Medical History:  Diagnosis Date   Cataract    Colon polyps    hyperplastic   Diverticulosis    Emphysema of lung (HCC)    minor scaring on top of lungs   Esophageal stricture    GERD (gastroesophageal reflux disease)    History of bronchitis    Hyperlipidemia    Hypertension    Mitral valve prolapse    Osteoarthritis    Osteoporosis    Spinal stenosis    Stress due to illness of family member 07/05/2014   mom is doidng better but stress of total caretaking cooking etc      Allergies  Allergen Reactions   Other Other (See Comments)    Joint pain. Patient denies being allergic to anything else.simvastatin    Lisinopril  Cough    Tolerates losartan    Oxycodone  Nausea Only and Palpitations    Change in mental status, camps, anxiety, chills   Simvastatin Other (See Comments)    Myalgias    Social History   Socioeconomic History   Marital status: Married    Spouse name: Not on file   Number of children: 2   Years of education: Not on file   Highest education level: Master's degree (e.g., MA, MS, MEng, MEd, MSW, MBA)  Occupational History   Occupation: Retired  Tobacco Use   Smoking  status: Former    Current packs/day: 0.00    Average packs/day: 1 pack/day for 29.0 years (29.0 ttl pk-yrs)    Types: Cigarettes    Start date: 05/08/1970    Quit date: 05/09/1999    Years since quitting: 24.3   Smokeless tobacco: Never  Vaping Use   Vaping status: Never Used  Substance and Sexual Activity   Alcohol use: Yes    Alcohol/week: 0.0 standard drinks of alcohol    Comment: OCC- [place] W[place]   Drug use: No   Sexual activity: Not Currently  Other Topics Concern   Not on file  Social History Narrative   Married   HH of 2 helps take care of elderly  mother living nearby.   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   Neg td neg ets   Social Drivers of Corporate investment banker Strain: Low Risk  (06/15/2023)   Overall Financial Resource Strain (CARDIA)    Difficulty of Paying Living Expenses: Not very hard  Food Insecurity: No Food Insecurity (06/15/2023)   Hunger Vital Sign    Worried About Running Out of Food in the Last Year: Never true    Ran Out of Food in the Last Year: Never true  Transportation Needs: No Transportation Needs (06/15/2023)   PRAPARE - Administrator, Civil Service (Medical): No    Lack of Transportation (Non-Medical): No  Physical Activity: Sufficiently Active (06/15/2023)   Exercise Vital Sign    Days of Exercise per Week: 5 days    Minutes of Exercise per Session: 30 min  Stress: No Stress Concern Present (06/15/2023)   [employer] of Occupational Health - Occupational Stress Questionnaire    Feeling of Stress : Not at all  Social Connections: Socially Integrated (06/15/2023)   Social Connection and Isolation Panel [NHANES]    Frequency of Communication with Friends and Family: Three times a week    Frequency of Social Gatherings with Friends and Family: Patient declined    Attends Religious Services: More than 4 times per year    Active Member of  Clubs or Organizations: Yes    Attends Banker Meetings: More than 4 times per year    Marital Status: Married    Vitals:   09/24/23 1251  BP: 120/60  Pulse: 100  Resp: 16  SpO2: 97%   Body mass index is 25.33 kg/m.  Physical Exam Vitals and nursing note reviewed.  Constitutional:      General: She is not in acute distress.    Appearance: She is well-developed.  HENT:     Head: Normocephalic and atraumatic.     Mouth/Throat:     Mouth: Mucous membranes are moist.  Eyes:     Conjunctiva/sclera: Conjunctivae normal.  Cardiovascular:     Rate and Rhythm: Normal rate and regular rhythm.     Heart sounds: No murmur heard. Pulmonary:     Effort: Pulmonary effort is normal. No respiratory distress.     Breath sounds: Normal breath sounds.  Abdominal:     Palpations: Abdomen is soft. There is no mass.     Tenderness: There is abdominal tenderness in the periumbilical area. There is no right CVA tenderness, left CVA tenderness, guarding or rebound.  Skin:    General: Skin is warm.     Findings: No erythema.  Neurological:     General: No focal deficit present.     Mental Status: She is alert and oriented to person, place, and time.     Gait: Gait normal.  Psychiatric:        Mood and Affect: Mood and affect normal.    ASSESSMENT AND PLAN: Ms. Jacqueline Myers was seen today for urinary urgency and frequency.   Lower abdominal pain Reported as new, started yesterday. No acute abdomen or significant pain with examination today. 6 weeks of diarrhea, which she is not concerned about today. Continue adequate hydration. Monitor for new symptoms. Continue following with PCP.  Dysuria Urine dipstick abnormal, pos for ketones,protein,nitrite, and trace of leuk. Urine sent for Cx.  -     POCT  Urinalysis Dipstick (Automated) -     Urine Culture; Future  Urinary tract infection without hematuria, site unspecified Hx of urinary frequency but repots new  onset of dysuria and lower abdominal pain, so empiric treatment with Bactrim  DS started today. We discussed some side effects of med, including worsening diarrhea. Will tailor treatment according to Ucx and sensitivity report. Clearly instructed about warning signs.  -     Sulfamethoxazole -Trimethoprim ; Take 1 tablet by mouth 2 (two) times daily for 5 days.  Dispense: 10 tablet; Refill: 0  Return if symptoms worsen or fail to improve, for keep next appointment with PCP.   I, Bernita Bristle, acting as a scribe for Lelah Rennaker [place], MD., have documented all relevant documentation on the behalf of Jacqueline Leavens [place], MD, as directed by   while in the presence of Jacqueline Masse [place], MD.  I, Jennessy Sandridge [place], MD, have reviewed all documentation for this visit. The documentation on 09/24/23 for the exam, diagnosis, procedures, and orders are all accurate and complete.  Jacqueline Ferraiolo G. [place], MD  [facility]. [place] office.

## 2023-09-25 ENCOUNTER — Ambulatory Visit: Payer: Self-pay | Admitting: Family Medicine

## 2023-09-25 LAB — URINE CULTURE
MICRO NUMBER:: 16472989
SPECIMEN QUALITY:: ADEQUATE

## 2023-10-08 ENCOUNTER — Other Ambulatory Visit (INDEPENDENT_AMBULATORY_CARE_PROVIDER_SITE_OTHER)

## 2023-10-08 DIAGNOSIS — R799 Abnormal finding of blood chemistry, unspecified: Secondary | ICD-10-CM

## 2023-10-08 DIAGNOSIS — R739 Hyperglycemia, unspecified: Secondary | ICD-10-CM | POA: Diagnosis not present

## 2023-10-08 DIAGNOSIS — Z79899 Other long term (current) drug therapy: Secondary | ICD-10-CM

## 2023-10-08 DIAGNOSIS — I10 Essential (primary) hypertension: Secondary | ICD-10-CM | POA: Diagnosis not present

## 2023-10-08 LAB — BASIC METABOLIC PANEL WITH GFR
BUN: 23 mg/dL (ref 6–23)
CO2: 29 meq/L (ref 19–32)
Calcium: 9.3 mg/dL (ref 8.4–10.5)
Chloride: 100 meq/L (ref 96–112)
Creatinine, Ser: 0.92 mg/dL (ref 0.40–1.20)
GFR: 61.62 mL/min (ref >60.00)
Glucose, Bld: 103 mg/dL — ABNORMAL HIGH (ref 70–99)
Potassium: 4 meq/L (ref 3.5–5.1)
Sodium: 137 meq/L (ref 135–145)

## 2023-10-09 ENCOUNTER — Other Ambulatory Visit: Payer: Self-pay | Admitting: Internal Medicine

## 2023-10-09 DIAGNOSIS — Z0000 Encounter for general adult medical examination without abnormal findings: Secondary | ICD-10-CM

## 2023-10-10 ENCOUNTER — Ambulatory Visit: Payer: Self-pay

## 2023-10-10 LAB — CYSTATIN C WITH GLOMERULAR FILTRATION RATE, ESTIMATED (EGFR)
CYSTATIN C: 1.23 mg/L — ABNORMAL HIGH (ref 0.52–1.10)
eGFR: 52 mL/min/{1.73_m2} — ABNORMAL LOW (ref >=60)

## 2023-10-10 NOTE — Telephone Encounter (Signed)
 FYI Only or Action Required?: FYI only for provider  Patient was last seen in primary care on 09/24/2023 by [place], Betty G, MD. Called Nurse Triage reporting Covid Positive, Sore Throat, and Cough. Symptoms began yesterday. Interventions attempted: Rest, hydration, or home remedies. Symptoms are: gradually worsening.  Triage Disposition: Call PCP Within 24 Hours  Patient/caregiver understands and will follow disposition?: Yes             Copied From CRM [id number]. Reason for Triage: Positive Covid test: Body aches, hoarse throat, headache/coughing.  Reason for Disposition  [1] HIGH RISK patient (e.g., weak immune system, age > 64 years, obesity with BMI 30 or higher, pregnant, chronic lung disease or other chronic medical condition) AND [2] COVID symptoms (e.g., cough, fever)  (Exceptions: Already seen by PCP and no new or worsening symptoms.)  Answer Assessment - Initial Assessment Questions 1. COVID-19 DIAGNOSIS: "How do you know that you have COVID?" (e.g., positive lab test or self-test, diagnosed by doctor or NP/PA, symptoms after exposure).     Diagnosed today 2. COVID-19 EXPOSURE: "Was there any known exposure to COVID before the symptoms began?" CDC Definition of close contact: within 6 feet (2 meters) for a total of 15 minutes or more over a 24-hour period.      Not sure  3. ONSET: "When did the COVID-19 symptoms start?"      Yesterday  4. WORST SYMPTOM: "What is your worst symptom?" (e.g., cough, fever, shortness of breath, muscle aches)     Cough  5. COUGH: "Do you have a cough?" If Yes, ask: "How bad is the cough?"       Yes -- cough w/ green sputum  6. FEVER: "Do you have a fever?" If Yes, ask: "What is your temperature, how was it measured, and when did it start?"     No  7. RESPIRATORY STATUS: "Describe your breathing?" (e.g., normal; shortness of breath, wheezing, unable to speak)      No SOB 8. BETTER-SAME-WORSE: "Are you getting better, staying the same or  getting worse compared to yesterday?"  If getting worse, ask, "In what way?"     Worse  9. OTHER SYMPTOMS: "Do you have any other symptoms?"  (e.g., chills, fatigue, headache, loss of smell or taste, muscle pain, sore throat)     Sore throat, H/A, cough. Denies N/V. A little diarrhea this AM. Denies CP; has not taken OTC remedy yet  10. HIGH RISK DISEASE: "Do you have any chronic medical problems?" (e.g., asthma, heart or lung disease, weak immune system, obesity, etc.)       HTN; emphysema and light scarring to the lungs 11. VACCINE: "Have you had the COVID-19 vaccine?" If Yes, ask: "Which one, how many shots, when did you get it?"       "I've had all of them up through September"  Protocols used: Coronavirus (COVID-19) Diagnosed or Suspected-A-AH

## 2023-10-11 ENCOUNTER — Telehealth (INDEPENDENT_AMBULATORY_CARE_PROVIDER_SITE_OTHER): Admitting: Adult Health

## 2023-10-11 ENCOUNTER — Encounter: Payer: Self-pay | Admitting: Adult Health

## 2023-10-11 VITALS — BP 137/72 | HR 81 | Temp 99.0°F | Ht 63.0 in | Wt 143.0 lb

## 2023-10-11 DIAGNOSIS — U071 COVID-19: Secondary | ICD-10-CM

## 2023-10-11 MED ORDER — NIRMATRELVIR/RITONAVIR (PAXLOVID)TABLET: 3.0000 | ORAL_TABLET | Freq: Two times a day (BID) | ORAL | 0 refills | Status: AC

## 2023-10-11 NOTE — Progress Notes (Signed)
 Virtual Visit via Video Note  I connected with Jacqueline Myers  on 10/11/23 at 11:30 AM EDT by a video enabled telemedicine application and verified that I am speaking with the correct person using two identifiers.  Location patient: home Location provider:work or home office Persons participating in the virtual visit: patient, provider  I discussed the limitations of evaluation and management by telemedicine and the availability of in person appointments. The patient expressed understanding and agreed to proceed.   HPI: This is a 73 year old female who is being evaluated today for an acute issue.  She started to feel ill yesterday and tested positive for COVID-19 via at home test.  Her husband also tested positive for COVID-19.  Her symptoms include sore throat, coughing, body aches, low grade fever and headaches.  She denies shortness of breath, wheezing, or chest congestion.   ROS: See pertinent positives and negatives per HPI.  Past Medical History:  Diagnosis Date   Cataract    Colon polyps    hyperplastic   Diverticulosis    Emphysema of lung (HCC)    minor scaring on top of lungs   Esophageal stricture    GERD (gastroesophageal reflux disease)    History of bronchitis    Hyperlipidemia    Hypertension    Mitral valve prolapse    Osteoarthritis    Osteoporosis    Spinal stenosis    Stress due to illness of family member 07/05/2014   mom is doidng better but stress of total caretaking cooking etc       Past Surgical History:  Procedure Laterality Date   COLONOSCOPY     NM MYOVIEW  LTD  08/2006   normal for abn ekg   TONSILLECTOMY     TOTAL KNEE ARTHROPLASTY Left 04/14/2015   Procedure: LEFT TOTAL KNEE ARTHROPLASTY;  Surgeon: Liliane Rei, MD;  Location: [facility];  Service: Orthopedics;  Laterality: Left;    Family History  Problem Relation Age of Onset   Alcohol abuse Mother    Stroke Mother    Hypertension Mother    Diabetes Mother    Hyperlipidemia Mother     Hyperlipidemia Father    Breast cancer Sister    Colon cancer Neg Hx    Colon polyps Neg Hx    Esophageal cancer Neg Hx    Rectal cancer Neg Hx    Stomach cancer Neg Hx        Current Outpatient Medications:    acetaminophen  (TYLENOL ) 500 MG tablet, Take 500-1,000 mg by mouth every 6 (six) hours as needed for moderate pain., Disp: , Rfl:    Candesartan  Cilexetil-HCTZ 32-25 MG TABS, Take 1 tablet by mouth once daily, Disp: 90 tablet, Rfl: 0   cholecalciferol (VITAMIN D3) 25 MCG (1000 UNIT) tablet, Take 2,000-4,000 Units by mouth., Disp: , Rfl:    cyclobenzaprine  (FLEXERIL ) 10 MG tablet, TAKE 1/2 TO 1 (ONE-HALF TO ONE) TABLET BY MOUTH THREE TIMES DAILY AS NEEDED FOR MUSCLE SPASM, Disp: 30 tablet, Rfl: 0   diclofenac  sodium (VOLTAREN ) 1 % GEL, Apply 4 g topically 4 (four) times daily., Disp: 100 g, Rfl: 2   levothyroxine  (SYNTHROID ) 50 MCG tablet, Take 1/2 (one-half) tablet by mouth once daily, Disp: 45 tablet, Rfl: 3   Omega-3 Fatty Acids (FISH OIL) 1200 MG CPDR, Take 2 capsules by mouth daily., Disp: , Rfl:    omeprazole  (PRILOSEC) 20 MG capsule, Take 1 capsule by mouth once daily, Disp: 90 capsule, Rfl: 0   ondansetron  (ZOFRAN -ODT) 4 MG disintegrating  tablet, Take 1 tablet (4 mg total) by mouth every 4 (four) hours as needed for nausea or vomiting., Disp: 20 tablet, Rfl: 0   rosuvastatin  (CRESTOR ) 10 MG tablet, Take 0.5 tablets (5 mg total) by mouth daily., Disp: 30 tablet, Rfl: 3   rosuvastatin  (CRESTOR ) 5 MG tablet, Take 1 tablet by mouth once daily, Disp: 90 tablet, Rfl: 3   valACYclovir  (VALTREX ) 1000 MG tablet, TAKE 1 TABLET BY MOUTH THREE TIMES DAILY AS DIRECTED, Disp: 30 tablet, Rfl: 0   ezetimibe  (ZETIA ) 10 MG tablet, Take 1 tablet (10 mg total) by mouth daily., Disp: 90 tablet, Rfl: 3  Current Facility-Administered Medications:    0.9 %  sodium chloride  infusion, 500 mL, Intravenous, Once, Tobin Forts, MD  EXAM:  VITALS per patient if applicable:  GENERAL: alert,  oriented, appears well and in no acute distress  HEENT: atraumatic, conjunttiva clear, no obvious abnormalities on inspection of external nose and ears  NECK: normal movements of the head and neck  LUNGS: on inspection no signs of respiratory distress, breathing rate appears normal, no obvious gross SOB, gasping or wheezing  CV: no obvious cyanosis  MS: moves all visible extremities without noticeable abnormality  PSYCH/NEURO: pleasant and cooperative, no obvious depression or anxiety, speech and thought processing grossly intact  ASSESSMENT AND PLAN:  Discussed the following assessment and plan:  1. COVID-19 virus infection (Primary) - Treat with Paxlovid.  She was encouraged to stop her statin and Zetia  while taking Paxlovid and for 10 days after finishing her course.  Follow-up if symptoms worsen. - nirmatrelvir/ritonavir (PAXLOVID) 20 x 150 MG & 10 x 100MG  TABS; Take 3 tablets by mouth 2 (two) times daily for 5 days. (Take nirmatrelvir 150 mg two tablets twice daily for 5 days and ritonavir 100 mg one tablet twice daily for 5 days) Patient GFR is 66  Dispense: 30 tablet; Refill: 0     I discussed the assessment and treatment plan with the patient. The patient was provided an opportunity to ask questions and all were answered. The patient agreed with the plan and demonstrated an understanding of the instructions.   The patient was advised to call back or seek an in-person evaluation if the symptoms worsen or if the condition fails to improve as anticipated.   Jacqueline Lathon, NP

## 2023-10-15 ENCOUNTER — Other Ambulatory Visit: Payer: Self-pay

## 2023-10-15 ENCOUNTER — Emergency Department (HOSPITAL_BASED_OUTPATIENT_CLINIC_OR_DEPARTMENT_OTHER): Admitting: Radiology

## 2023-10-15 ENCOUNTER — Emergency Department (HOSPITAL_BASED_OUTPATIENT_CLINIC_OR_DEPARTMENT_OTHER)

## 2023-10-15 ENCOUNTER — Emergency Department (HOSPITAL_BASED_OUTPATIENT_CLINIC_OR_DEPARTMENT_OTHER)
Admission: EM | Admit: 2023-10-15 | Discharge: 2023-10-15 | Disposition: A | Discharge status: Home / Self Care | Attending: Emergency Medicine | Admitting: Emergency Medicine

## 2023-10-15 DIAGNOSIS — R072 Precordial pain: Secondary | ICD-10-CM | POA: Insufficient documentation

## 2023-10-15 DIAGNOSIS — K449 Diaphragmatic hernia without obstruction or gangrene: Secondary | ICD-10-CM | POA: Diagnosis not present

## 2023-10-15 DIAGNOSIS — U071 COVID-19: Secondary | ICD-10-CM | POA: Diagnosis not present

## 2023-10-15 DIAGNOSIS — Z79899 Other long term (current) drug therapy: Secondary | ICD-10-CM | POA: Insufficient documentation

## 2023-10-15 DIAGNOSIS — I10 Essential (primary) hypertension: Secondary | ICD-10-CM | POA: Diagnosis not present

## 2023-10-15 DIAGNOSIS — N179 Acute kidney failure, unspecified: Secondary | ICD-10-CM | POA: Insufficient documentation

## 2023-10-15 DIAGNOSIS — Z0389 Encounter for observation for other suspected diseases and conditions ruled out: Secondary | ICD-10-CM | POA: Diagnosis not present

## 2023-10-15 DIAGNOSIS — R079 Chest pain, unspecified: Secondary | ICD-10-CM

## 2023-10-15 DIAGNOSIS — I700 Atherosclerosis of aorta: Secondary | ICD-10-CM | POA: Diagnosis not present

## 2023-10-15 DIAGNOSIS — J432 Centrilobular emphysema: Secondary | ICD-10-CM | POA: Diagnosis not present

## 2023-10-15 DIAGNOSIS — R0789 Other chest pain: Secondary | ICD-10-CM | POA: Diagnosis not present

## 2023-10-15 LAB — CBC
HCT: 39.1 % (ref 36.0–46.0)
Hemoglobin: 13.2 g/dL (ref 12.0–15.0)
MCH: 29.5 pg (ref 26.0–34.0)
MCHC: 33.8 g/dL (ref 30.0–36.0)
MCV: 87.5 fL (ref 80.0–100.0)
Platelets: 230 10*3/uL (ref 150–400)
RBC: 4.47 MIL/uL (ref 3.87–5.11)
RDW: 13.6 % (ref 11.5–15.5)
WBC: 8.4 10*3/uL (ref 4.0–10.5)
nRBC: 0 % (ref 0.0–0.2)

## 2023-10-15 LAB — BASIC METABOLIC PANEL WITH GFR
Anion gap: 15 (ref 5–15)
BUN: 26 mg/dL — ABNORMAL HIGH (ref 8–23)
CO2: 21 mmol/L — ABNORMAL LOW (ref 22–32)
Calcium: 9.2 mg/dL (ref 8.9–10.3)
Chloride: 99 mmol/L (ref 98–111)
Creatinine, Ser: 1.57 mg/dL — ABNORMAL HIGH (ref 0.44–1.00)
GFR, Estimated: 34 mL/min — ABNORMAL LOW (ref >60)
Glucose, Bld: 94 mg/dL (ref 70–99)
Potassium: 4.4 mmol/L (ref 3.5–5.1)
Sodium: 135 mmol/L (ref 135–145)

## 2023-10-15 LAB — TROPONIN T, HIGH SENSITIVITY
Troponin T High Sensitivity: <19 ng/L (ref <19)
Troponin T High Sensitivity: <15 ng/L (ref <19)

## 2023-10-15 LAB — RESP PANEL BY RT-PCR (RSV, FLU A&B, COVID)  RVPGX2
Influenza A by PCR: NEGATIVE
Influenza B by PCR: NEGATIVE
Resp Syncytial Virus by PCR: NEGATIVE
SARS Coronavirus 2 by RT PCR: POSITIVE — AB

## 2023-10-15 MED ORDER — IPRATROPIUM-ALBUTEROL 0.5-2.5 (3) MG/3ML IN SOLN: 3.0000 mL | Freq: Once | RESPIRATORY_TRACT | Status: AC

## 2023-10-15 MED ORDER — IPRATROPIUM-ALBUTEROL 0.5-2.5 (3) MG/3ML IN SOLN
RESPIRATORY_TRACT | Status: AC
  Administered 2023-10-15: 3 mL via RESPIRATORY_TRACT
  Filled 2023-10-15: qty 3

## 2023-10-15 MED ORDER — SODIUM CHLORIDE 0.9 % IV BOLUS
1000.0000 mL | Freq: Once | INTRAVENOUS | Status: AC
  Administered 2023-10-15: 1000 mL via INTRAVENOUS

## 2023-10-15 MED ORDER — IOHEXOL 350 MG/ML SOLN
100.0000 mL | Freq: Once | INTRAVENOUS | Status: AC | PRN
  Administered 2023-10-15: 100 mL via INTRAVENOUS

## 2023-10-15 NOTE — ED Notes (Signed)
 Reviewed AVS/discharge instruction with patient. Time allotted for and all questions answered. Patient is agreeable for d/c and escorted to ed exit by staff.

## 2023-10-15 NOTE — ED Provider Notes (Addendum)
 [facility] AT [facility] Provider Note   CSN: [id number] Arrival date & time: 10/15/23  1127     History  Chief Complaint  Patient presents with   Chest Pain    Jacqueline Myers is a 73 y.o. female.   Chest Pain    73 year old female with medical history significant for HTN, HLD, spinal stenosis, diverticulosis, mitral valve prolapse, presenting to the emergency department with a chief complaint of chest pain.  The patient states that her husband tested positive for COVID 6 days ago but she has had multiple home test that have been negative.  20 minutes prior to arrival, she developed sudden onset substernal chest pain, no radiation, some associated nausea and feeling hot.  Symptoms improved after she took a single baby aspirin.  She states that pain has gone down from a 7 out of 10 to more for 4-5 out of 10.  She denies any lower extremity swelling, denies any history of DVT or PE.  At the time, she did not experience heart palpitations, stated that she had no shortness of breath or cough.  She denies any fevers.  She denies any sharp ripping or tearing pain, no radiation to the back.  Home Medications Prior to Admission medications   Medication Sig Start Date End Date Taking? Authorizing Provider  acetaminophen  (TYLENOL ) 500 MG tablet Take 500-1,000 mg by mouth every 6 (six) hours as needed for moderate pain.    [provider]  Candesartan  Cilexetil-HCTZ 32-25 MG TABS Take 1 tablet by mouth once daily 09/04/23   Panosh, Wanda K, MD  cholecalciferol (VITAMIN D3) 25 MCG (1000 UNIT) tablet Take 2,000-4,000 Units by mouth.    [provider]  cyclobenzaprine  (FLEXERIL ) 10 MG tablet TAKE 1/2 TO 1 (ONE-HALF TO ONE) TABLET BY MOUTH THREE TIMES DAILY AS NEEDED FOR MUSCLE SPASM 02/21/23   Webb, Padonda B, FNP  diclofenac  sodium (VOLTAREN ) 1 % GEL Apply 4 g topically 4 (four) times daily. 09/13/16   Panosh, Joaquim Muir, MD  ezetimibe  (ZETIA ) 10 MG  tablet Take 1 tablet (10 mg total) by mouth daily. 11/13/22 02/11/23  Carie Charity, NP  levothyroxine  (SYNTHROID ) 50 MCG tablet Take 1/2 (one-half) tablet by mouth once daily 03/05/23   Panosh, Wanda K, MD  nirmatrelvir /ritonavir  (PAXLOVID ) 20 x 150 MG & 10 x 100MG  TABS Take 3 tablets by mouth 2 (two) times daily for 5 days. (Take nirmatrelvir  150 mg two tablets twice daily for 5 days and ritonavir  100 mg one tablet twice daily for 5 days) Patient GFR is 66 10/11/23 10/16/23  Nafziger, Randel Buss, NP  Omega-3 Fatty Acids (FISH OIL) 1200 MG CPDR Take 2 capsules by mouth daily.    [provider]  omeprazole  (PRILOSEC) 20 MG capsule Take 1 capsule by mouth once daily 10/09/23   Webb, Padonda B, FNP  ondansetron  (ZOFRAN -ODT) 4 MG disintegrating tablet Take 1 tablet (4 mg total) by mouth every 4 (four) hours as needed for nausea or vomiting. 07/17/21   Wynetta Heckle, MD  rosuvastatin  (CRESTOR ) 10 MG tablet Take 0.5 tablets (5 mg total) by mouth daily. 11/13/22   Carie Charity, NP  rosuvastatin  (CRESTOR ) 5 MG tablet Take 1 tablet by mouth once daily 09/04/23   Panosh, Wanda K, MD  valACYclovir  (VALTREX ) 1000 MG tablet TAKE 1 TABLET BY MOUTH THREE TIMES DAILY AS DIRECTED 09/07/23   Panosh, Wanda K, MD      Allergies    Other, Lisinopril , Oxycodone , and Simvastatin  Review of Systems   Review of Systems  Cardiovascular:  Positive for chest pain.  All other systems reviewed and are negative.   Physical Exam Updated Vital Signs BP 117/64 (BP Location: Right Arm)   Pulse 92   Temp 98.2 F (36.8 C) (Oral)   Resp 17   SpO2 98%  Physical Exam Vitals and nursing note reviewed.  Constitutional:      General: She is not in acute distress.    Appearance: She is well-developed.  HENT:     Head: Normocephalic and atraumatic.  Eyes:     Conjunctiva/sclera: Conjunctivae normal.  Cardiovascular:     Rate and Rhythm: Normal rate and regular rhythm.     Pulses: Normal pulses.     Heart sounds: No  murmur heard. Pulmonary:     Effort: Pulmonary effort is normal. No respiratory distress.     Breath sounds: Normal breath sounds.  Chest:     Comments: Chest wall nontender Abdominal:     Palpations: Abdomen is soft.     Tenderness: There is no abdominal tenderness.  Musculoskeletal:        General: No swelling.     Cervical back: Neck supple.  Skin:    General: Skin is warm and dry.     Capillary Refill: Capillary refill takes less than 2 seconds.  Neurological:     Mental Status: She is alert.  Psychiatric:        Mood and Affect: Mood normal.     ED Results / Procedures / Treatments   Labs (all labs ordered are listed, but only abnormal results are displayed) Labs Reviewed  RESP PANEL BY RT-PCR (RSV, FLU A&B, COVID)  RVPGX2 - Abnormal; Notable for the following components:      Result Value   SARS Coronavirus 2 by RT PCR POSITIVE (*)    All other components within normal limits  BASIC METABOLIC PANEL WITH GFR - Abnormal; Notable for the following components:   CO2 21 (*)    BUN 26 (*)    Creatinine, Ser 1.57 (*)    GFR, Estimated 34 (*)    All other components within normal limits  CBC  TROPONIN T, HIGH SENSITIVITY    EKG EKG Interpretation Date/Time:  Monday October 15 2023 11:40:15 EDT Ventricular Rate:  91 PR Interval:  172 QRS Duration:  101 QT Interval:  354 QTC Calculation: 436 R Axis:   72  Text Interpretation: Sinus rhythm Abnormal R-wave progression, early transition Confirmed by [name] ([id number]) on 10/15/2023 11:48:50 AM  Radiology DG Chest 2 View Result Date: 10/15/2023 CLINICAL DATA:  Chest pain. EXAM: CHEST - 2 VIEW COMPARISON:  09/12/2022 FINDINGS: The heart size and mediastinal contours are within normal limits. Both lungs are clear. The visualized skeletal structures are unremarkable. IMPRESSION: No active cardiopulmonary disease. Electronically Signed   By: Marlyce Sine M.D.   On: 10/15/2023 12:42    Procedures Procedures    Medications  Ordered in ED Medications  ipratropium-albuterol (DUONEB) 0.5-2.5 (3) MG/3ML nebulizer solution 3 mL (has no administration in time range)  sodium chloride  0.9 % bolus 1,000 mL (1,000 mLs Intravenous New Bag/Given 10/15/23 1447)  iohexol  (OMNIPAQUE ) 350 MG/ML injection 100 mL (100 mLs Intravenous Contrast Given 10/15/23 1416)    ED Course/ Medical Decision Making/ A&P Clinical Course as of 10/15/23 1458  Mon Oct 15, 2023  1407 SARS Coronavirus 2 by RT PCR(!): POSITIVE [JL]    Clinical Course User Index [JL] [name], MD  HEART Score: 4                    Medical Decision Making Amount and/or Complexity of Data Reviewed Labs: ordered. Decision-making details documented in ED Course. Radiology: ordered.  Risk Prescription drug management.    73 year old female with medical history significant for HTN, HLD, spinal stenosis, diverticulosis, mitral valve prolapse, presenting to the emergency department with a chief complaint of chest pain.  The patient states that her husband tested positive for COVID 6 days ago but she has had multiple home test that have been negative.  20 minutes prior to arrival, she developed sudden onset substernal chest pain, no radiation, some associated nausea and feeling hot.  Symptoms improved after she took a single baby aspirin.  She states that pain has gone down from a 7 out of 10 to more for 4-5 out of 10.  She denies any lower extremity swelling, denies any history of DVT or PE.  At the time, she did not experience heart palpitations, stated that she had no shortness of breath or cough.  She denies any fevers.  She denies any sharp ripping or tearing pain, no radiation to the back.  Medical Decision Making: Jacqueline Myers is a 73 y.o. female who presented to the ED today with chest pain, detailed above.  Based on patient's comorbidities, patient has a heart score of 4.     Complete initial physical exam performed, notably the patient was  CTAB, no LE edema.   Reviewed and confirmed nursing documentation for past medical history, family history, social history.    Initial Assessment: With the patient's presentation of left-sided chest pain, most likely diagnosis is musculoskeletal chest pain versus GERD, although ACS remains on the differential. Other diagnoses were considered including (but not limited to) pulmonary embolism, community-acquired pneumonia, aortic dissection, pneumothorax, underlying bony abnormality, anemia. These are considered less likely due to history of present illness and physical exam findings.    In particular, concerning pulmonary embolism: Patient is PERC positive and the they have a moderate risk Wells score. Aortic Dissection also considered but seems less likely based on the location, quality, onset, and severity of symptoms in this case.   Initial Plan: Evaluate for ACS with delta troponin and EKG evaluated as below  Evaluate for dissection, bony abnormality, or pneumonia with chest x-ray and screening laboratory evaluation including CBC, BMP  Further evaluation for pulmonary embolism indicated at this time based on patient's PERC and Wells score.  Further evaluation for Thoracic Aortic Dissection not indicated at this time based on patient's clinical history and PE findings.  Covid testing  Initial Study Results: EKG was reviewed independently. Rate, rhythm, axis, intervals all examined and without medically relevant abnormality. ST segments without concerns for elevations.    Laboratory  Initial troponin was normal, repeat troponin was pending  CBC without a leukocytosis or anemia.  BMP revealed evidence of an AKI with a serum creatinine of 1.57 and elevated BUN to 26 from baseline of less than 1.  COVID PCR testing was positive for COVID-19.  Radiology  DG Chest 2 View Result Date: 10/15/2023 CLINICAL DATA:  Chest pain. EXAM: CHEST - 2 VIEW COMPARISON:  09/12/2022 FINDINGS: The heart size and  mediastinal contours are within normal limits. Both lungs are clear. The visualized skeletal structures are unremarkable. IMPRESSION: No active cardiopulmonary disease. Electronically Signed   By: Marlyce Sine M.D.   On: 10/15/2023 12:42    Final Assessment and Plan:  Plan to obtain CTA imaging to rule out PE, evidence of pneumonia.  Chest x-ray was clear.  In the setting of the patient's COVID-19, suspect likely discomfort due to symptomatic COVID-19.  Considered admission for observation in the setting of the patient's AKI and COVID-19.  Plan to reassess the patient pending delta troponin, disposition pending.  CTA pending at time of signout, delta troponin pending at time of signout, signout given to Dr. Val Garin at 1500.   Final Clinical Impression(s) / ED Diagnoses Final diagnoses:  COVID-19  AKI (acute kidney injury) (HCC)  Chest pain, unspecified type    Rx / DC Orders ED Discharge Orders     None         [name], MD 10/15/23 1458    [name], MD 10/15/23 1756

## 2023-10-15 NOTE — ED Notes (Signed)
 Pt. Ambulated in hallway on room air.  O2 sat dropped to 95%

## 2023-10-15 NOTE — ED Provider Notes (Signed)
  Physical Exam  BP (!) 125/51   Pulse 91   Temp 98.3 F (36.8 C) (Axillary)   Resp (!) 21   SpO2 98%   Physical Exam  Procedures  Procedures  ED Course / MDM   Clinical Course as of 10/15/23 1746  Mon Oct 15, 2023  1407 SARS Coronavirus 2 by RT PCR(!): POSITIVE [JL]    Clinical Course User Index [JL] Jacqueline Concha, MD   Medical Decision Making Amount and/or Complexity of Data Reviewed Labs: ordered. Decision-making details documented in ED Course. Radiology: ordered.  Risk Prescription drug management.   Patient shortness of breath cough.  Has had a week of symptoms.  Received in signout.  Had chest pain that began an hour prior to arrival.  Does have positive COVID test otherwise workup reassuring.  No pneumonia or PE on CT.  Patient is eager to go home and appears stable for discharge.  With now a week of symptoms not a good candidate for Paxlovid .  ED care to go home and will discharge.  Creatinine mildly increased will need to follow with PCP.       Jacqueline Arias, MD 10/15/23 1747

## 2023-10-15 NOTE — ED Triage Notes (Signed)
 Pt caox4 c/o CP that started approx 20 min prior to arriving to ED while working outside. Pt also reports nausea when CP initially started. Pt also reports husband tested positive for COVID 6 days ago but her home tests have been neg.

## 2023-10-20 ENCOUNTER — Other Ambulatory Visit: Payer: Self-pay | Admitting: General Practice

## 2023-11-29 ENCOUNTER — Other Ambulatory Visit: Payer: Self-pay | Admitting: Internal Medicine

## 2023-11-29 DIAGNOSIS — Z0000 Encounter for general adult medical examination without abnormal findings: Secondary | ICD-10-CM

## 2023-12-21 ENCOUNTER — Encounter (HOSPITAL_BASED_OUTPATIENT_CLINIC_OR_DEPARTMENT_OTHER): Payer: Self-pay | Admitting: Cardiovascular Disease

## 2023-12-21 ENCOUNTER — Ambulatory Visit (INDEPENDENT_AMBULATORY_CARE_PROVIDER_SITE_OTHER): Admitting: Cardiovascular Disease

## 2023-12-21 VITALS — BP 126/68 | HR 76 | Ht 63.0 in | Wt 144.7 lb

## 2023-12-21 DIAGNOSIS — E7800 Pure hypercholesterolemia, unspecified: Secondary | ICD-10-CM | POA: Diagnosis not present

## 2023-12-21 DIAGNOSIS — I10 Essential (primary) hypertension: Secondary | ICD-10-CM

## 2023-12-21 NOTE — Patient Instructions (Signed)
 Medication Instructions:  Your physician recommends that you continue on your current medications as directed. Please refer to the Current Medication list given to you today.  *If you need a refill on your cardiac medications before your next appointment, please call your pharmacy*  Lab Work: None.  If you have labs (blood work) drawn today and your tests are completely normal, you will receive your results only by: MyChart Message (if you have MyChart) OR A paper copy in the mail If you have any lab test that is abnormal or we need to change your treatment, we will call you to review the results.  Testing/Procedures: None.  Follow-Up: At [facility], you and your health needs are our priority.  As part of our continuing mission to provide you with exceptional heart care, our providers are all part of one team.  This team includes your primary Cardiologist (physician) and Advanced Practice Providers or APPs (Physician Assistants and Nurse Practitioners) who all work together to provide you with the care you need, when you need it.  Your next appointment:   1 year(s)  Provider:   Annabella Scarce, MD

## 2023-12-21 NOTE — Progress Notes (Signed)
 Cardiology Office Note:  .   Date:  12/21/2023  ID:  Jacqueline Myers, DOB [birth year], MRN [id number] PCP: Charlett Apolinar POUR, MD  [facility] Providers Cardiologist:  Annabella Scarce, MD    History of Present Illness: .    Jacqueline Myers is a 73 y.o. female with non-obstructive CAD, hypertension, hyperlipidemia, and emphysema who presents for follow up.  Jacqueline Myers saw Dr. Apolinar Charlett on 02/14/16 and reported episodes of chest pain with exertion.  She was evaluated in clinic 02/17/16 and underwent exercise Myoview  10/25 that was negative for ischemia and revealed LVEF 75%.  She has also been working with Dr. Charlett to better treat her anxiety. She wondered if this may be contributing.  She increased her citalopram  an no longer had chest pain.   Jacqueline Myers has previously taken simvastatin but didn't tolerate it due to myalgias.  She was started on rosuvastatin  02/2016.  She denies any recurrent myalgias.  Jacqueline Myers followed up with Comer Satterfield, DNP 05/2022 for epigastric chest discomfort laying down.  She had no exertional CP.  Symptoms were thought to be GI related.   Discussed the use of AI scribe software for clinical note transcription with the patient, who gave verbal consent to proceed.  History of Present Illness Jacqueline Myers has experienced three incidents of chest pain in the past year. Two episodes occurred while lying on her left side, characterized by a squeezing sensation that resolved upon changing position. The most severe episode occurred in June while working outside, causing intense pain that brought her to her knees and led to an emergency room visit. Diagnostic tests at that time were normal, and she has not experienced any discomfort since June.  Her physical activity is not regular, but she remains active through gardening and yard work. She sometimes feels tired and finds it difficult to walk up the yard. She engages in activities such as  gardening and yard work and occasionally uses a recumbent bike and participates in [employer].  She has a history of emphysema, plaque in her aorta, and a small amount of plaque in one of her coronary arteries. She takes omeprazole  daily for reflux, which was diagnosed in 2016 when her esophagus was dilated. Attempts to discontinue omeprazole  were unsuccessful due to symptom recurrence.  Her current medications include candesartan  and hydrochlorothiazide  for blood pressure, which she monitors at home, noting it is often low but without significant dizziness or lightheadedness. She also takes rosuvastatin  and ezetimibe  for cholesterol management, which has been effective, although she experiences leg pain, particularly in her thighs and calves, which she attributes to tiredness. She has been on the same statin dose for years and has recently sought massage therapy to alleviate leg discomfort. No significant dizziness or lightheadedness despite low blood pressure readings, and reports leg pain in thighs and calves.  ROS:  As per HPI  Studies Reviewed: .       Echo 03/2019: 1. Left ventricular ejection fraction, by visual estimation, is 60 to  65%. The left ventricle has normal function. There is moderately increased  left ventricular hypertrophy.   2. Left ventricular diastolic parameters are consistent with Grade I  diastolic dysfunction (impaired relaxation).   3. Global right ventricle has normal systolic function.The right  ventricular size is normal. No increase in right ventricular wall  thickness.   4. Left atrial size was normal.   5. Right atrial size was normal.   6. The mitral valve is grossly normal.  Trace mitral valve regurgitation.   7. No diagnostic mitral valve prolapse.   8. The tricuspid valve is grossly normal. Tricuspid valve regurgitation  is trivial.   9. The aortic valve is tricuspid. Aortic valve regurgitation is not  visualized.  10. The pulmonic valve was  grossly normal. Pulmonic valve regurgitation is  not visualized.  11. The inferior vena cava is normal in size with greater than 50%  respiratory variability, suggesting right atrial pressure of 3 mmHg.   Risk Assessment/Calculations:             Physical Exam:   VS:  BP 126/68   Pulse 76   Ht 5' 3 (1.6 m)   Wt 144 lb 11.2 oz (65.6 kg)   SpO2 96%   BMI 25.63 kg/m  , BMI Body mass index is 25.63 kg/m. GENERAL:  Well appearing HEENT: Pupils equal round and reactive, fundi not visualized, oral mucosa unremarkable NECK:  No jugular venous distention, waveform within normal limits, carotid upstroke brisk and symmetric, no bruits, no thyromegaly LUNGS:  Clear to auscultation bilaterally HEART:  RRR.  PMI not displaced or sustained,S1 and S2 within normal limits, no S3, no S4, no clicks, no rubs, no murmurs ABD:  Flat, positive bowel sounds normal in frequency in pitch, no bruits, no rebound, no guarding, no midline pulsatile mass, no hepatomegaly, no splenomegaly EXT:  2 plus pulses throughout, no edema, no cyanosis no clubbing SKIN:  No rashes no nodules NEURO:  Cranial nerves II through XII grossly intact, motor grossly intact throughout PSYCH:  Cognitively intact, oriented to person place and time   ASSESSMENT AND PLAN: .    Assessment & Plan # Atherosclerotic cardiovascular disease with coronary and aortic plaque Intermittent CP that is not exertional. Pain when lying down likely gastrointestinal. She has GERD that is now controlled on PPI.  Coronary and aortic plaques are small and known. - Continue rosuvastatin and Zetia  # Hypertension Blood pressure well-controlled on candesartan and hydrochlorothiazide. No symptoms of hypotension. - Continue current antihypertensive regimen. - Monitor blood pressure regularly.  # Hyperlipidemia, on statin and ezetimibe therapy Cholesterol levels controlled with rosuvastatin and ezetimibe. LDL at 54. Leg pain possibly statin-related but  dose tolerated. - Continue rosuvastatin and ezetimibe.  # Gastroesophageal reflux disease (GERD) GERD managed with omeprazole. Previous discontinuation attempts led to symptom recurrence. Aware of long-term risks but continues to prevent complications. - Continue omeprazole daily.  # Myalgia, possibly statin-associated Leg pain possibly related to statin use.  She is on a very low dose at 5mg .  Statin dose tolerated. - Continue current statin regimen.  Dispo: f/u 1 year  Signed, Annabella Scarce, MD

## 2024-01-04 ENCOUNTER — Other Ambulatory Visit: Payer: Self-pay | Admitting: Family

## 2024-01-04 DIAGNOSIS — Z0000 Encounter for general adult medical examination without abnormal findings: Secondary | ICD-10-CM

## 2024-01-16 ENCOUNTER — Other Ambulatory Visit: Payer: Self-pay | Admitting: Cardiovascular Disease

## 2024-01-17 ENCOUNTER — Other Ambulatory Visit: Payer: Self-pay

## 2024-01-17 DIAGNOSIS — Z0000 Encounter for general adult medical examination without abnormal findings: Secondary | ICD-10-CM

## 2024-01-17 MED ORDER — OMEPRAZOLE 20 MG PO CPDR: 20.0000 mg | DELAYED_RELEASE_CAPSULE | Freq: Every day | ORAL | 0 refills | Status: DC

## 2024-02-02 ENCOUNTER — Other Ambulatory Visit: Payer: Self-pay | Admitting: Internal Medicine

## 2024-02-13 ENCOUNTER — Other Ambulatory Visit: Payer: Self-pay | Admitting: Internal Medicine

## 2024-02-13 DIAGNOSIS — Z0000 Encounter for general adult medical examination without abnormal findings: Secondary | ICD-10-CM

## 2024-02-14 ENCOUNTER — Encounter: Payer: Self-pay | Admitting: Internal Medicine

## 2024-02-14 DIAGNOSIS — M1711 Unilateral primary osteoarthritis, right knee: Secondary | ICD-10-CM | POA: Diagnosis not present

## 2024-02-16 NOTE — Telephone Encounter (Signed)
 Sorry this has happened...   I  am not aware of checking a person for radon . However  exposure over time can be a set  up for respiratory disease and risk  for lung cancer  maybe.   We could have you see the pulmonary team  to discuss .  You can make appt to  discuss  if you wish or with pulmonary team .

## 2024-02-19 ENCOUNTER — Encounter: Payer: Self-pay | Admitting: Internal Medicine

## 2024-02-19 ENCOUNTER — Ambulatory Visit: Admitting: Internal Medicine

## 2024-02-19 VITALS — BP 116/56 | HR 84 | Temp 98.2°F | Ht 63.0 in | Wt 144.0 lb

## 2024-02-19 DIAGNOSIS — X3901XS Exposure to radon, sequela: Secondary | ICD-10-CM | POA: Diagnosis not present

## 2024-02-19 DIAGNOSIS — Z8616 Personal history of COVID-19: Secondary | ICD-10-CM | POA: Diagnosis not present

## 2024-02-19 DIAGNOSIS — R053 Chronic cough: Secondary | ICD-10-CM

## 2024-02-19 NOTE — Progress Notes (Signed)
 Chief Complaint  Patient presents with   Cough    Pt c/o chronic cough. Found out that the house has high level of radon.     HPI: Jacqueline Myers 73 y.o. come in forfu disc    chornci cough continues   and  ? 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  Ex tobaco  no new cp sob  had covid inrection in June . Asks about vaccine and timing.  Saw Dr True 2023 with almost nl pfts  ( cts cans show some emphysema) scarring .  Inhaler no help .  On  ppi for  poss reflux continues to have throat cleraing and cough sometimes with mucous.   Notices if windows open vs closer air  .  Cannot take nasal steroid caused  se?    Radon 6.5  remediation.  ROS: See pertinent positives and negatives per HPI.  Past Medical History:  Diagnosis Date   Cataract    Colon polyps    hyperplastic   Diverticulosis    Emphysema of lung (HCC)    minor scaring on top of lungs   Esophageal stricture    GERD (gastroesophageal reflux disease)    History of bronchitis    Hyperlipidemia    Hypertension    Mitral valve prolapse    Osteoarthritis    Osteoporosis    Spinal stenosis    Stress due to illness of family member 07/05/2014   mom is doidng better but stress of total caretaking cooking etc       Family History  Problem Relation Age of Onset   Alcohol abuse Mother    Stroke Mother    Hypertension Mother    Diabetes Mother    Hyperlipidemia Mother    Hyperlipidemia Father    Breast cancer Sister    Colon cancer Neg Hx    Colon polyps Neg Hx    Esophageal cancer Neg Hx    Rectal cancer Neg Hx    Stomach cancer Neg Hx     Social History   Socioeconomic History   Marital status: Married    Spouse name: Not on file   Number of children: 2   Years of education: Not on file   Highest education level: Master's degree (e.g., MA, MS, MEng, MEd, MSW, MBA)  Occupational History   Occupation: Retired  Tobacco Use   Smoking status: Former    Current packs/day:  0.00    Average packs/day: 1 pack/day for 29.0 years (29.0 ttl pk-yrs)    Types: Cigarettes    Start date: 05/08/1970    Quit date: 05/09/1999    Years since quitting: 24.8   Smokeless tobacco: Never  Vaping Use   Vaping status: Never Used  Substance and Sexual Activity   Alcohol use: Yes    Alcohol/week: 0.0 standard drinks of alcohol    Comment: OCC- GLASS OF WINE   Drug use: No   Sexual activity: Not Currently  Other Topics Concern   Not on file  Social History Narrative   Married   HH of 2 helps take care 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   Neg td neg ets   Social Drivers of Corporate investment banker Strain: Low Risk  (02/18/2024)   Overall Financial Resource Strain (CARDIA)  Difficulty of Paying Living Expenses: Not very hard  Food Insecurity: No Food Insecurity (02/18/2024)   Hunger Vital Sign    Worried About Running Out of Food in the Last Year: Never true    Ran Out of Food in the Last Year: Never true  Transportation Needs: No Transportation Needs (02/18/2024)   PRAPARE - Administrator, Civil Service (Medical): No    Lack of Transportation (Non-Medical): No  Physical Activity: Insufficiently Active (02/18/2024)   Exercise Vital Sign    Days of Exercise per Week: 4 days    Minutes of Exercise per Session: 20 min  Stress: Stress Concern Present (02/18/2024)   [employer] of Occupational Health - Occupational Stress Questionnaire    Feeling of Stress: To some extent  Social Connections: Unknown (02/18/2024)   Social Connection and Isolation Panel    Frequency of Communication with Friends and Family: Once a week    Frequency of Social Gatherings with Friends and Family: Patient declined    Attends Religious Services: More than 4 times per year    Active Member of [employer] or Organizations: No    Attends Museum/gallery exhibitions officer: Not on file    Marital Status: Married    Outpatient Medications Prior to Visit  Medication Sig Dispense Refill   acetaminophen  (TYLENOL ) 500 MG tablet Take 500-1,000 mg by mouth every 6 (six) hours as needed for moderate pain.     Candesartan  Cilexetil-HCTZ 32-25 MG TABS Take 1 tablet by mouth once daily 90 tablet 0   cholecalciferol (VITAMIN D3) 25 MCG (1000 UNIT) tablet Take 2,000-4,000 Units by mouth.     cyclobenzaprine  (FLEXERIL ) 10 MG tablet TAKE 1/2 TO 1 (ONE-HALF TO ONE) TABLET BY MOUTH THREE TIMES DAILY AS NEEDED FOR MUSCLE SPASM 30 tablet 0   diclofenac  sodium (VOLTAREN ) 1 % GEL Apply 4 g topically 4 (four) times daily. 100 g 2   ezetimibe  (ZETIA ) 10 MG tablet TAKE 1 TABLET BY MOUTH ONCE DAILY . APPOINTMENT REQUIRED FOR FUTURE REFILLS 90 tablet 0   levothyroxine  (SYNTHROID ) 50 MCG tablet Take 1/2 (one-half) tablet by mouth once daily 45 tablet 0   omeprazole  (PRILOSEC) 20 MG capsule Take 1 capsule (20 mg total) by mouth daily. 90 capsule 0   ondansetron  (ZOFRAN -ODT) 4 MG disintegrating tablet Take 1 tablet (4 mg total) by mouth every 4 (four) hours as needed for nausea or vomiting. 20 tablet 0   rosuvastatin  (CRESTOR ) 5 MG tablet Take 1 tablet by mouth once daily 90 tablet 3   valACYclovir  (VALTREX ) 1000 MG tablet TAKE 1 TABLET BY MOUTH THREE TIMES DAILY AS DIRECTED 30 tablet 0   Facility-Administered Medications Prior to Visit  Medication Dose Route Frequency Provider Last Rate Last Admin   0.9 %  sodium chloride  infusion  500 mL Intravenous Once Abran Norleen SAILOR, MD         EXAM:  BP (!) 116/56 (BP Location: Left Arm, Patient Position: Sitting, Cuff Size: Normal)   Pulse 84   Temp 98.2 F (36.8 C) (Oral)   Ht 5' 3 (1.6 m)   Wt 144 lb (65.3 kg)   SpO2 97%   BMI 25.51 kg/m   Body mass index is 25.51 kg/m.  GENERAL: vitals reviewed and listed above, alert, oriented, appears well hydrated and in no acute distress HEENT: atraumatic, conjunctiva   clear, no obvious abnormalities on inspection of external nose and ears midl hoarse and throat clearing  upper airway cough   NECK: no  obvious masses on inspection palpation  LUNGS: clear to auscultation bilaterally, no wheezes, rales or rhonchi, good air movement CV: HRRR, no clubbing cyanosis or  peripheral edema nl cap refill  PSYCH: pleasant and cooperative, no obvious depression or anxiety Lab Results  Component Value Date   WBC 8.4 10/15/2023   HGB 13.2 10/15/2023   HCT 39.1 10/15/2023   PLT 230 10/15/2023   GLUCOSE 94 10/15/2023   CHOL 133 09/12/2023   TRIG 143.0 09/12/2023   HDL 50.30 09/12/2023   LDLDIRECT 127.6 06/23/2013   LDLCALC 54 09/12/2023   ALT 20 09/12/2023   AST 18 09/12/2023   NA 135 10/15/2023   K 4.4 10/15/2023   CL 99 10/15/2023   CREATININE 1.57 (H) 10/15/2023   BUN 26 (H) 10/15/2023   CO2 21 (L) 10/15/2023   TSH 3.72 09/12/2023   INR 0.93 04/08/2015   HGBA1C 6.1 09/12/2023   MICROALBUR 1.1 09/12/2023   BP Readings from Last 3 Encounters:  02/19/24 (!) 116/56  12/21/23 126/68  10/15/23 (!) 125/51  Record review   spirometry 2023 , ct  6 2025   ASSESSMENT AND PLAN:  Discussed the following assessment and plan:  Chronic coughing  Exposure to radon, sequela  Personal history of COVID-19 Try astepro of rx for now .  Of call for x astelin if helpful  Consider allergy evaluation. Consider getting back with  pulm team but  seems to be upper airway ( has some nasal congestion today )  No hx of acute exposure .  Utd vaccines  would wait 6-12 mos after covid infection for booster since not aware of compelling data of benefit as to early timing .   But up to her.  -Patient advised to return or notify health care team  if  new concerns arise. 35 mintues eval  record review as to pulmonary cough eval and imaging from past  records .  Patient Instructions  Consider seeing pulmonary team again.    Can try   astepro nose spray antihistamine   for at  least a week.  If helpful continue  as needed.   Consider allergy evaluation.  If not helpful.     Raoul Ciano K. Beverlee Wilmarth M.D.

## 2024-02-19 NOTE — Patient Instructions (Signed)
 Consider seeing pulmonary team again.    Can try   astepro nose spray antihistamine   for at least a week.  If helpful continue  as needed.   Consider allergy evaluation.  If not helpful.

## 2024-02-21 DIAGNOSIS — M1711 Unilateral primary osteoarthritis, right knee: Secondary | ICD-10-CM | POA: Diagnosis not present

## 2024-02-24 ENCOUNTER — Other Ambulatory Visit: Payer: Self-pay | Admitting: Internal Medicine

## 2024-02-24 DIAGNOSIS — Z0000 Encounter for general adult medical examination without abnormal findings: Secondary | ICD-10-CM

## 2024-02-28 DIAGNOSIS — M1711 Unilateral primary osteoarthritis, right knee: Secondary | ICD-10-CM | POA: Diagnosis not present

## 2024-04-12 ENCOUNTER — Other Ambulatory Visit: Payer: Self-pay | Admitting: Cardiovascular Disease

## 2024-04-13 ENCOUNTER — Other Ambulatory Visit: Payer: Self-pay | Admitting: Internal Medicine

## 2024-04-13 DIAGNOSIS — Z0000 Encounter for general adult medical examination without abnormal findings: Secondary | ICD-10-CM

## 2024-04-24 ENCOUNTER — Ambulatory Visit: Admitting: Internal Medicine

## 2024-05-14 ENCOUNTER — Other Ambulatory Visit: Payer: Self-pay | Admitting: Internal Medicine

## 2024-05-14 DIAGNOSIS — Z0000 Encounter for general adult medical examination without abnormal findings: Secondary | ICD-10-CM

## 2024-05-18 ENCOUNTER — Encounter: Payer: Self-pay | Admitting: Internal Medicine

## 2024-05-19 NOTE — Telephone Encounter (Signed)
 Agree with visit  will discuss

## 2024-05-21 ENCOUNTER — Other Ambulatory Visit: Payer: Self-pay | Admitting: Internal Medicine

## 2024-05-21 DIAGNOSIS — Z0000 Encounter for general adult medical examination without abnormal findings: Secondary | ICD-10-CM

## 2024-05-22 ENCOUNTER — Ambulatory Visit: Admitting: Internal Medicine

## 2024-05-22 ENCOUNTER — Encounter: Payer: Self-pay | Admitting: Internal Medicine

## 2024-05-22 VITALS — BP 116/56 | HR 90 | Temp 98.3°F | Ht 63.0 in | Wt 150.0 lb

## 2024-05-22 DIAGNOSIS — E559 Vitamin D deficiency, unspecified: Secondary | ICD-10-CM | POA: Diagnosis not present

## 2024-05-22 DIAGNOSIS — I10 Essential (primary) hypertension: Secondary | ICD-10-CM

## 2024-05-22 DIAGNOSIS — M79604 Pain in right leg: Secondary | ICD-10-CM | POA: Diagnosis not present

## 2024-05-22 DIAGNOSIS — M79605 Pain in left leg: Secondary | ICD-10-CM | POA: Diagnosis not present

## 2024-05-22 DIAGNOSIS — Z79899 Other long term (current) drug therapy: Secondary | ICD-10-CM | POA: Diagnosis not present

## 2024-05-22 LAB — BASIC METABOLIC PANEL WITH GFR
BUN: 16 mg/dL (ref 6–23)
CO2: 31 meq/L (ref 19–32)
Calcium: 9.1 mg/dL (ref 8.4–10.5)
Chloride: 101 meq/L (ref 96–112)
Creatinine, Ser: 0.9 mg/dL (ref 0.40–1.20)
GFR: 62.99 mL/min
Glucose, Bld: 100 mg/dL — ABNORMAL HIGH (ref 70–99)
Potassium: 4.1 meq/L (ref 3.5–5.1)
Sodium: 138 meq/L (ref 135–145)

## 2024-05-22 LAB — VITAMIN D 25 HYDROXY (VIT D DEFICIENCY, FRACTURES): VITD: 48.7 ng/mL (ref 30.00–100.00)

## 2024-05-22 NOTE — Patient Instructions (Signed)
 Take blood pressure readings twice a day for 5 days  and then periodically .To ensure below 140/90   .Send in readings .  Plan   send in readings   Lab today .   Compression stockings ok  each day .

## 2024-05-22 NOTE — Progress Notes (Signed)
 "  Chief Complaint  Patient presents with   Medical Management of Chronic Issues    Pt reports usually her BP systolic is below 120. But at the [facility] initial BP for systolic 150. This morning PB went back down to normal range. Pt brought BP reading.     HPI: Jacqueline Myers 74 y.o. come in for Chronic disease management   fu bp concerns   See above   had elevated readings 157 at new chiro and then concerned  checking bp at some  some are at goal others  in 140 + range   but today 115/64 Also mostly sendetary until recently  Stress and bilateral  distal leg pain .  Went back to gym that has helped .as well as compression.  Taking same bp meds  candesartanhctz .  Avoiding nsiads  because of dec gfr so  back pain adding   No new cp sob  syndope ROS: See pertinent positives and negatives per HPI.  Past Medical History:  Diagnosis Date   Cataract    Colon polyps    hyperplastic   Diverticulosis    Emphysema of lung (HCC)    minor scaring on top of lungs   Esophageal stricture    GERD (gastroesophageal reflux disease)    History of bronchitis    Hyperlipidemia    Hypertension    Mitral valve prolapse    Osteoarthritis    Osteoporosis    Spinal stenosis    Stress due to illness of family member 07/05/2014   mom is doidng better but stress of total caretaking cooking etc       Family History  Problem Relation Age of Onset   Alcohol abuse Mother    Stroke Mother    Hypertension Mother    Diabetes Mother    Hyperlipidemia Mother    Hyperlipidemia Father    Breast cancer Sister    Colon cancer Neg Hx    Colon polyps Neg Hx    Esophageal cancer Neg Hx    Rectal cancer Neg Hx    Stomach cancer Neg Hx     Social History   Socioeconomic History   Marital status: Married    Spouse name: Not on file   Number of children: 2   Years of education: Not on file   Highest education level: Master's degree (e.g., MA, MS, MEng, MEd, MSW, MBA)  Occupational History    Occupation: Retired  Tobacco Use   Smoking status: Former    Current packs/day: 0.00    Average packs/day: 1 pack/day for 29.0 years (29.0 ttl pk-yrs)    Types: Cigarettes    Start date: 05/08/1970    Quit date: 05/09/1999    Years since quitting: 25.0   Smokeless tobacco: Never  Vaping Use   Vaping status: Never Used  Substance and Sexual Activity   Alcohol use: Yes    Alcohol/week: 0.0 standard drinks of alcohol    Comment: OCC- GLASS OF WINE   Drug use: No   Sexual activity: Not Currently  Other Topics Concern   Not on file  Social History Narrative   Married   HH of 2 helps take care 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   Neg td neg ets   Social Drivers of Health   Tobacco Use: Medium Risk (05/22/2024)   Patient History  Smoking Tobacco Use: Former    Smokeless Tobacco Use: Never    Passive Exposure: Not on Actuary Strain: Low Risk (02/18/2024)   Overall Financial Resource Strain (CARDIA)    Difficulty of Paying Living Expenses: Not very hard  Food Insecurity: No Food Insecurity (02/18/2024)   Epic    Worried About Programme Researcher, Broadcasting/film/video in the Last Year: Never true    Ran Out of Food in the Last Year: Never true  Transportation Needs: No Transportation Needs (02/18/2024)   Epic    Lack of Transportation (Medical): No    Lack of Transportation (Non-Medical): No  Physical Activity: Insufficiently Active (02/18/2024)   Exercise Vital Sign    Days of Exercise per Week: 4 days    Minutes of Exercise per Session: 20 min  Stress: Stress Concern Present (02/18/2024)   [employer] of Occupational Health - Occupational Stress Questionnaire    Feeling of Stress: To some extent  Social Connections: Unknown (02/18/2024)   Social Connection and Isolation Panel    Frequency of Communication with Friends and Family: Once a week     Frequency of Social Gatherings with Friends and Family: Patient declined    Attends Religious Services: More than 4 times per year    Active Member of Clubs or Organizations: No    Attends Engineer, Structural: Not on file    Marital Status: Married  Depression (PHQ2-9): Low Risk (05/22/2024)   Depression (PHQ2-9)    PHQ-2 Score: 0  Alcohol Screen: Low Risk (06/15/2023)   Alcohol Screen    Last Alcohol Screening Score (AUDIT): 1  Housing: Low Risk (02/18/2024)   Epic    Unable to Pay for Housing in the Last Year: No    Number of Times Moved in the Last Year: 0    Homeless in the Last Year: No  Utilities: Not on file  Health Literacy: Not on file    Outpatient Medications Prior to Visit  Medication Sig Dispense Refill   acetaminophen  (TYLENOL ) 500 MG tablet Take 500-1,000 mg by mouth every 6 (six) hours as needed for moderate pain.     Candesartan  Cilexetil-HCTZ 32-25 MG TABS Take 1 tablet by mouth once daily 90 tablet 0   cholecalciferol (VITAMIN D3) 25 MCG (1000 UNIT) tablet Take 2,000-4,000 Units by mouth. (Patient taking differently: Take 2,000-4,000 Units by mouth. Once every 3 days)     cyclobenzaprine  (FLEXERIL ) 10 MG tablet TAKE 1/2 TO 1 (ONE-HALF TO ONE) TABLET BY MOUTH THREE TIMES DAILY AS NEEDED FOR MUSCLE SPASM 30 tablet 0   diclofenac  sodium (VOLTAREN ) 1 % GEL Apply 4 g topically 4 (four) times daily. (Patient taking differently: Apply 4 g topically 4 (four) times daily. As needed) 100 g 2   ezetimibe  (ZETIA ) 10 MG tablet Take 1 tablet (10 mg total) by mouth daily. 90 tablet 2   levothyroxine  (SYNTHROID ) 50 MCG tablet Take 1/2 (one-half) tablet by mouth once daily 45 tablet 0   omeprazole  (PRILOSEC) 20 MG capsule Take 1 capsule by mouth once daily 90 capsule 0   rosuvastatin  (CRESTOR ) 5 MG tablet Take 1 tablet by mouth once daily 90 tablet 3   valACYclovir  (VALTREX ) 1000 MG tablet TAKE 1 TABLET BY MOUTH THREE TIMES DAILY AS DIRECTED 30 tablet 0   ondansetron   (ZOFRAN -ODT) 4 MG disintegrating tablet Take 1 tablet (4 mg total) by mouth every 4 (four) hours as needed for nausea or vomiting. (Patient not taking: Reported on 05/22/2024) 20 tablet 0  Facility-Administered Medications Prior to Visit  Medication Dose Route Frequency Provider Last Rate Last Admin   0.9 %  sodium chloride  infusion  500 mL Intravenous Once Abran Norleen SAILOR, MD         EXAM:  BP (!) 116/56 (BP Location: Left Arm, Patient Position: Sitting, Cuff Size: Large)   Pulse 90   Temp 98.3 F (36.8 C) (Oral)   Ht 5' 3 (1.6 m)   Wt 150 lb (68 kg)   SpO2 96%   BMI 26.57 kg/m   Body mass index is 26.57 kg/m. BP Readings from Last 3 Encounters:  05/22/24 (!) 116/56  02/19/24 (!) 116/56  12/21/23 126/68    GENERAL: vitals reviewed and listed above, alert, oriented, appears well hydrated and in no acute distress HEENT: atraumatic, conjunctiva  clear, no obvious abnormalities on inspection of external nose and ears  NECK: no obvious masses on inspection palpation  LUNGS: clear to auscultation bilaterally, no wheezes, rales or rhonchi, good air movement CV: HRRR, no clubbing cyanosis or  peripheral edema nl cap refill  some superficial veins  MS: moves all extremities without noticeable focal  abnormality PSYCH: pleasant and cooperative, no obvious depression or anxiety Lab Results  Component Value Date   WBC 8.4 10/15/2023   HGB 13.2 10/15/2023   HCT 39.1 10/15/2023   PLT 230 10/15/2023   GLUCOSE 94 10/15/2023   CHOL 133 09/12/2023   TRIG 143.0 09/12/2023   HDL 50.30 09/12/2023   LDLDIRECT 127.6 06/23/2013   LDLCALC 54 09/12/2023   ALT 20 09/12/2023   AST 18 09/12/2023   NA 135 10/15/2023   K 4.4 10/15/2023   CL 99 10/15/2023   CREATININE 1.57 (H) 10/15/2023   BUN 26 (H) 10/15/2023   CO2 21 (L) 10/15/2023   TSH 3.72 09/12/2023   INR 0.93 04/08/2015   HGBA1C 6.1 09/12/2023   MICROALBUR 1.1 09/12/2023   BP Readings from Last 3 Encounters:  05/22/24 (!) 116/56   02/19/24 (!) 116/56  12/21/23 126/68   Last vitamin D  Lab Results  Component Value Date   VD25OH 44.61 09/21/2021    ASSESSMENT AND PLAN:  Discussed the following assessment and plan:  Essential hypertension - Plan: Basic metabolic panel with GFR, Vitamin D , 25-hydroxy  Leg pain, bilateral - Plan: Basic metabolic panel with GFR, Vitamin D , 25-hydroxy  Vitamin D  deficiency - Plan: Basic metabolic panel with GFR, Vitamin D , 25-hydroxy  Medication management - Plan: Basic metabolic panel with GFR, Vitamin D , 25-hydroxy Suspect temporary  BP elevations  and  in range today  no evidence of cv decline   Check vit d  for now stay on rosuvastatin    Disc 5 day bp bid readings   and not if makes anxious.  Reg compression and exercise as tolerated .  Do not think dvt is likely .   -Patient advised to return or notify health care team  if  new concerns arise. In interim  Patient Instructions  Take blood pressure readings twice a day for 5 days  and then periodically .To ensure below 140/90   .Send in readings .  Plan   send in readings   Lab today .   Compression stockings ok  each day .      Shiri Hodapp K. Anay Walter M.D. "

## 2024-05-23 ENCOUNTER — Ambulatory Visit: Payer: Self-pay | Admitting: Internal Medicine

## 2024-05-23 NOTE — Progress Notes (Signed)
 Kidney function  gfr now normal  and vit d in  normal range .  Reassuring.

## 2024-06-03 ENCOUNTER — Encounter: Payer: Self-pay | Admitting: Internal Medicine

## 2024-06-03 NOTE — Telephone Encounter (Signed)
 Thanks for the update   (Not sure if there is a icd diagnostic code for that!)  I agree. Follow
